# Patient Record
Sex: Male | Born: 1945 | Race: Black or African American | Hispanic: No | Marital: Married | Smoking: Former smoker
Health system: Southern US, Community
[De-identification: ages and names within clinical notes are randomized; demographics above are authoritative.]

## PROBLEM LIST (undated history)

## (undated) DIAGNOSIS — J189 Pneumonia, unspecified organism: Secondary | ICD-10-CM

## (undated) DIAGNOSIS — F1010 Alcohol abuse, uncomplicated: Secondary | ICD-10-CM

## (undated) DIAGNOSIS — M1990 Unspecified osteoarthritis, unspecified site: Secondary | ICD-10-CM

## (undated) DIAGNOSIS — D72819 Decreased white blood cell count, unspecified: Secondary | ICD-10-CM

## (undated) DIAGNOSIS — I10 Essential (primary) hypertension: Secondary | ICD-10-CM

## (undated) HISTORY — DX: Unspecified osteoarthritis, unspecified site: M19.90

## (undated) HISTORY — PX: APPENDECTOMY: SHX54

---

## 2006-11-11 ENCOUNTER — Emergency Department: Payer: Self-pay | Admitting: Emergency Medicine

## 2011-07-12 ENCOUNTER — Emergency Department: Payer: Self-pay | Admitting: Emergency Medicine

## 2011-07-13 ENCOUNTER — Observation Stay: Payer: Self-pay | Admitting: Internal Medicine

## 2011-07-14 DIAGNOSIS — R079 Chest pain, unspecified: Secondary | ICD-10-CM

## 2011-07-17 ENCOUNTER — Ambulatory Visit: Payer: Self-pay | Admitting: Internal Medicine

## 2014-04-20 ENCOUNTER — Ambulatory Visit: Payer: Self-pay | Admitting: Family Medicine

## 2015-11-08 ENCOUNTER — Encounter: Payer: Self-pay | Admitting: *Deleted

## 2015-11-09 ENCOUNTER — Encounter
Admission: RE | Disposition: A | Discharge status: Home / Self Care | Payer: Self-pay | Source: Ambulatory Visit | Attending: Gastroenterology

## 2015-11-09 ENCOUNTER — Ambulatory Visit
Admission: RE | Admit: 2015-11-09 | Discharge: 2015-11-09 | Disposition: A | Discharge status: Home / Self Care | Payer: Medicare Other | Source: Ambulatory Visit | Attending: Gastroenterology | Admitting: Gastroenterology

## 2015-11-09 ENCOUNTER — Encounter: Payer: Self-pay | Admitting: *Deleted

## 2015-11-09 ENCOUNTER — Ambulatory Visit: Payer: Medicare Other | Admitting: Anesthesiology

## 2015-11-09 DIAGNOSIS — K5730 Diverticulosis of large intestine without perforation or abscess without bleeding: Secondary | ICD-10-CM | POA: Insufficient documentation

## 2015-11-09 DIAGNOSIS — Z79899 Other long term (current) drug therapy: Secondary | ICD-10-CM | POA: Insufficient documentation

## 2015-11-09 DIAGNOSIS — D123 Benign neoplasm of transverse colon: Secondary | ICD-10-CM | POA: Diagnosis not present

## 2015-11-09 DIAGNOSIS — J449 Chronic obstructive pulmonary disease, unspecified: Secondary | ICD-10-CM | POA: Insufficient documentation

## 2015-11-09 DIAGNOSIS — Z87898 Personal history of other specified conditions: Secondary | ICD-10-CM | POA: Insufficient documentation

## 2015-11-09 DIAGNOSIS — I10 Essential (primary) hypertension: Secondary | ICD-10-CM | POA: Insufficient documentation

## 2015-11-09 DIAGNOSIS — K635 Polyp of colon: Secondary | ICD-10-CM | POA: Insufficient documentation

## 2015-11-09 DIAGNOSIS — F17200 Nicotine dependence, unspecified, uncomplicated: Secondary | ICD-10-CM | POA: Diagnosis not present

## 2015-11-09 DIAGNOSIS — Z1211 Encounter for screening for malignant neoplasm of colon: Secondary | ICD-10-CM | POA: Insufficient documentation

## 2015-11-09 HISTORY — DX: Decreased white blood cell count, unspecified: D72.819

## 2015-11-09 HISTORY — DX: Alcohol abuse, uncomplicated: F10.10

## 2015-11-09 HISTORY — PX: COLONOSCOPY: SHX5424

## 2015-11-09 HISTORY — DX: Essential (primary) hypertension: I10

## 2015-11-09 SURGERY — COLONOSCOPY
Anesthesia: General

## 2015-11-09 MED ORDER — PROPOFOL 500 MG/50ML IV EMUL
INTRAVENOUS | Status: DC | PRN
  Administered 2015-11-09: 130 ug/kg/min via INTRAVENOUS

## 2015-11-09 MED ORDER — SODIUM CHLORIDE 0.9 % IV SOLN
INTRAVENOUS | Status: DC
  Administered 2015-11-09: 11:00:00 via INTRAVENOUS

## 2015-11-09 MED ORDER — PROPOFOL 10 MG/ML IV BOLUS
INTRAVENOUS | Status: DC | PRN
  Administered 2015-11-09: 10 mg via INTRAVENOUS
  Administered 2015-11-09: 30 mg via INTRAVENOUS
  Administered 2015-11-09: 20 mg via INTRAVENOUS

## 2015-11-09 MED ORDER — PHENYLEPHRINE HCL 10 MG/ML IJ SOLN
INTRAMUSCULAR | Status: DC | PRN
  Administered 2015-11-09 (×2): 100 ug via INTRAVENOUS

## 2015-11-09 MED ORDER — LIDOCAINE HCL (CARDIAC) 20 MG/ML IV SOLN
INTRAVENOUS | Status: DC | PRN
  Administered 2015-11-09: 80 mg via INTRAVENOUS

## 2015-11-09 MED ORDER — MIDAZOLAM HCL 2 MG/2ML IJ SOLN
INTRAMUSCULAR | Status: DC | PRN
  Administered 2015-11-09: 1 mg via INTRAVENOUS

## 2015-11-09 NOTE — H&P (Signed)
  Primary Care Physician:  No primary care provider on file.  Pre-Procedure History & Physical: HPI:  Troy Shepherd is a 69 y.o. male is here for an colonoscopy.   Past Medical History  Diagnosis Date  . Alcohol abuse     quit 09/2014  . Hypertension   . Leukopenia     resolved when stopped drinking 09/2014    Past Surgical History  Procedure Laterality Date  . Appendectomy      Prior to Admission medications   Medication Sig Start Date End Date Taking? Authorizing Provider  amLODipine (NORVASC) 5 MG tablet Take 5 mg by mouth daily.   Yes Historical Provider, MD  lisinopril (PRINIVIL,ZESTRIL) 10 MG tablet Take 10 mg by mouth daily.   Yes Historical Provider, MD    Allergies as of 10/08/2015  . (Not on File)    History reviewed. No pertinent family history.  Social History   Social History  . Marital Status: Married    Spouse Name: N/A  . Number of Children: N/A  . Years of Education: N/A   Occupational History  . Not on file.   Social History Main Topics  . Smoking status: Current Every Day Smoker  . Smokeless tobacco: Never Used  . Alcohol Use: Yes  . Drug Use: No  . Sexual Activity: Not on file   Other Topics Concern  . Not on file   Social History Narrative     Physical Exam: BP 133/80 mmHg  Pulse 86  Temp(Src) 98.8 F (37.1 C) (Tympanic)  Resp 18  Ht '5\' 7"'$  (1.702 m)  Wt 66.679 kg (147 lb)  BMI 23.02 kg/m2  SpO2 100% General:   Alert,  pleasant and cooperative in NAD Head:  Normocephalic and atraumatic. Neck:  Supple; no masses or thyromegaly. Lungs:  Clear throughout to auscultation.    Heart:  Regular rate and rhythm. Abdomen:  Soft, nontender and nondistended. Normal bowel sounds, without guarding, and without rebound.   Neurologic:  Alert and  oriented x4;  grossly normal neurologically.  Impression/Plan: Troy Shepherd is here for an colonoscopy to be performed for screening  Risks, benefits, limitations, and alternatives regarding   colonoscopy have been reviewed with the patient.  Questions have been answered.  All parties agreeable.   Josefine Class, MD  11/09/2015, 10:44 AM

## 2015-11-09 NOTE — Anesthesia Procedure Notes (Signed)
Date/Time: 11/09/2015 10:48 AM Performed by: Johnna Acosta Pre-anesthesia Checklist: Patient identified, Emergency Drugs available, Suction available and Patient being monitored Patient Re-evaluated:Patient Re-evaluated prior to inductionOxygen Delivery Method: Nasal cannula

## 2015-11-09 NOTE — Op Note (Addendum)
[facility] Patient Name: Troy Shepherd Procedure Date: 11/09/2015 10:49 AM MRN: [id number] Account #: [id number] Date of Birth: [birth year] Admit Type: Outpatient Age: 69 Room: [facility] ENDO ROOM 2 Gender: Male Note Status: Finalized Procedure:         Colonoscopy Indications:       Screening for colorectal malignant neoplasm, Last                     colonoscopy: 2005 Patient Profile:   This is a 69 year old male. Providers:         Gerrit Heck. Rayann Heman, MD Referring MD:      Tania Ade (Referring MD) Medicines:         Propofol per Anesthesia Complications:     No immediate complications. Procedure:         Pre-Anesthesia Assessment:                    - Prior to the procedure, a History and Physical was                     performed, and patient medications, allergies and                     sensitivities were reviewed. The patient's tolerance of                     previous anesthesia was reviewed.                    After obtaining informed consent, the colonoscope was                     passed under direct vision. Throughout the procedure, the                     patient's blood pressure, pulse, and oxygen saturations                     were monitored continuously. The Olympus CF-H180AL                     colonoscope ( S#: [id number] ) was introduced through the                     anus and advanced to the the terminal ileum. The                     colonoscopy was performed without difficulty. The patient                     tolerated the procedure well. The quality of the bowel                     preparation was excellent. Findings:      The perianal and digital rectal examinations were normal.      The terminal ileum contained one pedunculated polyp. The polyp was 7 mm       in diameter. The polyp was removed with a cold snare. Resection and       retrieval were complete.      A 8 mm polyp was found in the transverse colon. The polyp  was sessile.       The polyp was removed with a hot snare. Resection and retrieval were  complete.      Many small and large-mouthed diverticula were found in the sigmoid       colon, few pockets of diverticulosis in asc colon Impression:        - One ileal polyp in the terminal ileum. Resected and                     retrieved.                    - One 8 mm polyp in the transverse colon. Resected and                     retrieved.                    - Diverticulosis in the sigmoid colon and asc colon. Recommendation:    - Observe patient in GI recovery unit.                    - High fiber diet.                    - Continue present medications.                    - Await pathology results.                    - Repeat colonoscopy for surveillance based on pathology                     results.                    - Return to referring physician.                    - The findings and recommendations were discussed with the                     patient.                    - The findings and recommendations were discussed with the                     patient's family. Procedure Code(s): --- Professional ---                    [phone], Colonoscopy, flexible; with removal of tumor(s),                     polyp(s), or other lesion(s) by snare technique Diagnosis Code(s): --- Professional ---                    Z12.11, Encounter for screening for malignant neoplasm of                     colon                    D13.39, Benign neoplasm of other parts of small intestine                    D12.3, Benign neoplasm of transverse colon                    K57.30, Diverticulosis of large intestine without  perforation or abscess without bleeding CPT copyright 2014 American Medical Association. All rights reserved. The codes documented in this report are preliminary and upon coder review may  be revised to meet current compliance requirements. Mellody Life, MD 11/09/2015  11:21:02 AM This report has been signed electronically. Number of Addenda: 0 Note Initiated On: 11/09/2015 10:49 AM Scope Withdrawal Time: 0 hours 15 minutes 11 seconds  Total Procedure Duration: 0 hours 21 minutes 18 seconds       [facility]

## 2015-11-09 NOTE — Anesthesia Postprocedure Evaluation (Signed)
  Anesthesia Post-op Note  Patient: Troy Shepherd  Procedure(s) Performed: Procedure(s): COLONOSCOPY (N/A)  Anesthesia type:General  Patient location: PACU  Post pain: Pain level controlled  Post assessment: Post-op Vital signs reviewed, Patient's Cardiovascular Status Stable, Respiratory Function Stable, Patent Airway and No signs of Nausea or vomiting  Post vital signs: Reviewed and stable  Last Vitals:  Filed Vitals:   11/09/15 1123  BP: 85/48  Pulse: 58  Temp: 36.8 C  Resp: 19    Level of consciousness: awake, alert  and patient cooperative  Complications: No apparent anesthesia complications

## 2015-11-09 NOTE — Transfer of Care (Signed)
Immediate Anesthesia Transfer of Care Note  Patient: Troy Shepherd  Procedure(s) Performed: Procedure(s): COLONOSCOPY (N/A)  Patient Location: PACU  Anesthesia Type:General  Level of Consciousness: sedated  Airway & Oxygen Therapy: Patient Spontanous Breathing and Patient connected to nasal cannula oxygen  Post-op Assessment: Report given to RN and Post -op Vital signs reviewed and stable  Post vital signs: Reviewed and stable  Last Vitals:  Filed Vitals:   11/09/15 1024  BP: 133/80  Pulse: 86  Temp: 37.1 C  Resp: 18    Complications: No apparent anesthesia complications

## 2015-11-09 NOTE — Discharge Instructions (Signed)

## 2015-11-09 NOTE — Anesthesia Preprocedure Evaluation (Addendum)
Anesthesia Evaluation  Patient identified by MRN, date of birth, ID band Patient awake    Reviewed: Allergy & Precautions, NPO status , Patient's Chart, lab work & pertinent test results  History of Anesthesia Complications Negative for: history of anesthetic complications  Airway Mallampati: II       Dental  (+) Teeth Intact   Pulmonary COPD, Current Smoker,    + rhonchi  + decreased breath sounds      Cardiovascular hypertension, Pt. on medications  Rhythm:Regular Rate:Normal     Neuro/Psych    GI/Hepatic negative GI ROS, Neg liver ROS,   Endo/Other  negative endocrine ROS  Renal/GU negative Renal ROS     Musculoskeletal   Abdominal Normal abdominal exam  (+)   Peds  Hematology negative hematology ROS (+)   Anesthesia Other Findings   Reproductive/Obstetrics                            Anesthesia Physical Anesthesia Plan  ASA: III  Anesthesia Plan: General   Post-op Pain Management:    Induction: Intravenous  Airway Management Planned: Nasal Cannula  Additional Equipment:   Intra-op Plan:   Post-operative Plan:   Informed Consent: I have reviewed the patients History and Physical, chart, labs and discussed the procedure including the risks, benefits and alternatives for the proposed anesthesia with the patient or authorized representative who has indicated his/her understanding and acceptance.     Plan Discussed with: CRNA  Anesthesia Plan Comments:         Anesthesia Quick Evaluation

## 2015-11-12 LAB — SURGICAL PATHOLOGY

## 2015-11-14 ENCOUNTER — Encounter: Payer: Self-pay | Admitting: Gastroenterology

## 2016-01-06 ENCOUNTER — Encounter: Payer: Self-pay | Admitting: Internal Medicine

## 2016-01-06 ENCOUNTER — Inpatient Hospital Stay
Admission: EM | Admit: 2016-01-06 | Discharge: 2016-01-07 | DRG: 872 | Disposition: A | Discharge status: Home / Self Care | Payer: Medicare Other | Attending: Internal Medicine | Admitting: Internal Medicine

## 2016-01-06 ENCOUNTER — Emergency Department: Payer: Medicare Other

## 2016-01-06 DIAGNOSIS — R6510 Systemic inflammatory response syndrome (SIRS) of non-infectious origin without acute organ dysfunction: Secondary | ICD-10-CM

## 2016-01-06 DIAGNOSIS — D72819 Decreased white blood cell count, unspecified: Secondary | ICD-10-CM | POA: Diagnosis present

## 2016-01-06 DIAGNOSIS — R112 Nausea with vomiting, unspecified: Secondary | ICD-10-CM | POA: Diagnosis present

## 2016-01-06 DIAGNOSIS — A419 Sepsis, unspecified organism: Secondary | ICD-10-CM | POA: Diagnosis not present

## 2016-01-06 DIAGNOSIS — I10 Essential (primary) hypertension: Secondary | ICD-10-CM | POA: Diagnosis present

## 2016-01-06 DIAGNOSIS — J209 Acute bronchitis, unspecified: Secondary | ICD-10-CM | POA: Diagnosis present

## 2016-01-06 DIAGNOSIS — F17200 Nicotine dependence, unspecified, uncomplicated: Secondary | ICD-10-CM | POA: Diagnosis present

## 2016-01-06 DIAGNOSIS — E876 Hypokalemia: Secondary | ICD-10-CM | POA: Diagnosis present

## 2016-01-06 DIAGNOSIS — R05 Cough: Secondary | ICD-10-CM | POA: Diagnosis not present

## 2016-01-06 DIAGNOSIS — Z79899 Other long term (current) drug therapy: Secondary | ICD-10-CM | POA: Diagnosis not present

## 2016-01-06 DIAGNOSIS — R059 Cough, unspecified: Secondary | ICD-10-CM

## 2016-01-06 DIAGNOSIS — E860 Dehydration: Secondary | ICD-10-CM | POA: Diagnosis present

## 2016-01-06 LAB — URINALYSIS COMPLETE WITH MICROSCOPIC (ARMC ONLY)
BILIRUBIN URINE: NEGATIVE
Bacteria, UA: NONE SEEN
Glucose, UA: NEGATIVE mg/dL
HGB URINE DIPSTICK: NEGATIVE
KETONES UR: NEGATIVE mg/dL
LEUKOCYTES UA: NEGATIVE
NITRITE: NEGATIVE
PH: 6 (ref 5.0–8.0)
PROTEIN: NEGATIVE mg/dL
SPECIFIC GRAVITY, URINE: 1.001 — AB (ref 1.005–1.030)
WBC UA: NONE SEEN WBC/hpf (ref 0–5)

## 2016-01-06 LAB — CBC WITH DIFFERENTIAL/PLATELET
Basophils Absolute: 0 10*3/uL (ref 0–0.1)
Basophils Relative: 1 %
EOS ABS: 0 10*3/uL (ref 0–0.7)
EOS PCT: 1 %
HCT: 34.3 % — ABNORMAL LOW (ref 40.0–52.0)
Hemoglobin: 11.9 g/dL — ABNORMAL LOW (ref 13.0–18.0)
LYMPHS ABS: 1 10*3/uL (ref 1.0–3.6)
Lymphocytes Relative: 33 %
MCH: 39.2 pg — AB (ref 26.0–34.0)
MCHC: 34.6 g/dL (ref 32.0–36.0)
MCV: 113.2 fL — AB (ref 80.0–100.0)
Monocytes Absolute: 0.4 10*3/uL (ref 0.2–1.0)
Monocytes Relative: 14 %
Neutro Abs: 1.5 10*3/uL (ref 1.4–6.5)
Neutrophils Relative %: 51 %
PLATELETS: 246 10*3/uL (ref 150–440)
RBC: 3.03 MIL/uL — AB (ref 4.40–5.90)
RDW: 14.6 % — AB (ref 11.5–14.5)
WBC: 3 10*3/uL — AB (ref 3.8–10.6)

## 2016-01-06 LAB — COMPREHENSIVE METABOLIC PANEL
ALT: 19 U/L (ref 17–63)
ANION GAP: 9 (ref 5–15)
AST: 74 U/L — ABNORMAL HIGH (ref 15–41)
Albumin: 3.6 g/dL (ref 3.5–5.0)
Alkaline Phosphatase: 52 U/L (ref 38–126)
BUN: 11 mg/dL (ref 6–20)
CHLORIDE: 101 mmol/L (ref 101–111)
CO2: 28 mmol/L (ref 22–32)
CREATININE: 1.11 mg/dL (ref 0.61–1.24)
Calcium: 8.8 mg/dL — ABNORMAL LOW (ref 8.9–10.3)
Glucose, Bld: 104 mg/dL — ABNORMAL HIGH (ref 65–99)
POTASSIUM: 2.9 mmol/L — AB (ref 3.5–5.1)
SODIUM: 138 mmol/L (ref 135–145)
Total Bilirubin: 0.5 mg/dL (ref 0.3–1.2)
Total Protein: 6.8 g/dL (ref 6.5–8.1)

## 2016-01-06 LAB — TROPONIN I

## 2016-01-06 LAB — LIPASE, BLOOD: LIPASE: 28 U/L (ref 11–51)

## 2016-01-06 LAB — LACTIC ACID, PLASMA: Lactic Acid, Venous: 2.9 mmol/L (ref 0.5–2.0)

## 2016-01-06 LAB — GLUCOSE, CAPILLARY: Glucose-Capillary: 90 mg/dL (ref 65–99)

## 2016-01-06 MED ORDER — SODIUM CHLORIDE 0.9 % IV BOLUS (SEPSIS)
821.0000 mL | Freq: Once | INTRAVENOUS | Status: AC
  Administered 2016-01-06: 821 mL via INTRAVENOUS

## 2016-01-06 MED ORDER — SODIUM CHLORIDE 0.9 % IV BOLUS (SEPSIS)
1000.0000 mL | Freq: Once | INTRAVENOUS | Status: AC
  Administered 2016-01-06: 1000 mL via INTRAVENOUS

## 2016-01-06 MED ORDER — ONDANSETRON HCL 4 MG PO TABS
4.0000 mg | ORAL_TABLET | Freq: Four times a day (QID) | ORAL | Status: DC | PRN
Start: 2016-01-06 — End: 2016-01-07

## 2016-01-06 MED ORDER — VANCOMYCIN HCL IN DEXTROSE 1-5 GM/200ML-% IV SOLN
1000.0000 mg | Freq: Once | INTRAVENOUS | Status: AC
Start: 2016-01-06 — End: 2016-01-06
  Administered 2016-01-06: 1000 mg via INTRAVENOUS
  Filled 2016-01-06: qty 200

## 2016-01-06 MED ORDER — ACETAMINOPHEN 325 MG PO TABS
650.0000 mg | ORAL_TABLET | Freq: Four times a day (QID) | ORAL | Status: DC | PRN
Start: 2016-01-06 — End: 2016-01-07

## 2016-01-06 MED ORDER — ENOXAPARIN SODIUM 40 MG/0.4ML ~~LOC~~ SOLN
40.0000 mg | Freq: Every day | SUBCUTANEOUS | Status: DC
  Administered 2016-01-06: 40 mg via SUBCUTANEOUS
  Filled 2016-01-06: qty 0.4

## 2016-01-06 MED ORDER — ACETAMINOPHEN 650 MG RE SUPP: 650.0000 mg | Freq: Four times a day (QID) | RECTAL | Status: DC | PRN

## 2016-01-06 MED ORDER — LISINOPRIL 10 MG PO TABS
10.0000 mg | ORAL_TABLET | Freq: Every day | ORAL | Status: DC
  Administered 2016-01-07: 08:00:00 10 mg via ORAL
  Filled 2016-01-06: qty 1

## 2016-01-06 MED ORDER — GUAIFENESIN-DM 100-10 MG/5ML PO SYRP
5.0000 mL | ORAL_SOLUTION | ORAL | Status: DC | PRN
  Administered 2016-01-06: 5 mL via ORAL
  Filled 2016-01-06: qty 5

## 2016-01-06 MED ORDER — SODIUM CHLORIDE 0.9 % IV SOLN
INTRAVENOUS | Status: AC
  Administered 2016-01-06 – 2016-01-07 (×2): via INTRAVENOUS

## 2016-01-06 MED ORDER — POTASSIUM CHLORIDE CRYS ER 20 MEQ PO TBCR
40.0000 meq | EXTENDED_RELEASE_TABLET | Freq: Once | ORAL | Status: AC
  Administered 2016-01-06: 40 meq via ORAL
  Filled 2016-01-06: qty 2

## 2016-01-06 MED ORDER — ONDANSETRON HCL 4 MG/2ML IJ SOLN
4.0000 mg | Freq: Once | INTRAMUSCULAR | Status: AC
  Administered 2016-01-06: 4 mg via INTRAVENOUS
  Filled 2016-01-06: qty 2

## 2016-01-06 MED ORDER — ONDANSETRON HCL 4 MG/2ML IJ SOLN: 4.0000 mg | Freq: Four times a day (QID) | INTRAMUSCULAR | Status: DC | PRN

## 2016-01-06 MED ORDER — AMLODIPINE BESYLATE 5 MG PO TABS
5.0000 mg | ORAL_TABLET | Freq: Every day | ORAL | Status: DC
  Administered 2016-01-07: 08:00:00 5 mg via ORAL
  Filled 2016-01-06: qty 1

## 2016-01-06 MED ORDER — PIPERACILLIN-TAZOBACTAM 3.375 G IVPB
3.3750 g | Freq: Once | INTRAVENOUS | Status: AC
  Administered 2016-01-06: 3.375 g via INTRAVENOUS
  Filled 2016-01-06: qty 50

## 2016-01-06 MED ORDER — SODIUM CHLORIDE 0.9 % IJ SOLN
3.0000 mL | Freq: Two times a day (BID) | INTRAMUSCULAR | Status: DC
  Administered 2016-01-06 – 2016-01-07 (×2): 3 mL via INTRAVENOUS

## 2016-01-06 MED ORDER — PIPERACILLIN-TAZOBACTAM 3.375 G IVPB
3.3750 g | Freq: Three times a day (TID) | INTRAVENOUS | Status: DC
  Administered 2016-01-07 (×2): 3.375 g via INTRAVENOUS
  Filled 2016-01-06 (×3): qty 50

## 2016-01-06 MED ORDER — VANCOMYCIN HCL IN DEXTROSE 1-5 GM/200ML-% IV SOLN
1000.0000 mg | INTRAVENOUS | Status: DC
  Administered 2016-01-07: 08:00:00 1000 mg via INTRAVENOUS
  Filled 2016-01-06 (×2): qty 200

## 2016-01-06 NOTE — ED Provider Notes (Signed)
[facility]  ____________________________________________  Time seen: Approximately 17:17 PM  I have reviewed the triage vital signs and the nursing notes.   HISTORY  Chief Complaint Cough    HPI Troy Shepherd is a 70 y.o. male with history of hypertension who presents for evaluation of 3 days productive cough and fatigue, gradual onset, constant since onset, no modifying factors, currently moderate. Patient has had subjective fevers and chills. He had 2 episodes of nonbloody nonbilious emesis today. He denies any chest pain or difficulty breathing. No diarrhea and he denies abdominal pain. No known sick contacts.   Past Medical History  Diagnosis Date  . Alcohol abuse     quit 09/2014  . Hypertension   . Leukopenia     resolved when stopped drinking 09/2014    There are no active problems to display for this patient.   Past Surgical History  Procedure Laterality Date  . Appendectomy    . Colonoscopy N/A 11/09/2015    Procedure: COLONOSCOPY;  Surgeon: Josefine Class, MD;  Location: [facility] ENDOSCOPY;  Service: Endoscopy;  Laterality: N/A;    Current Outpatient Rx  Name  Route  Sig  Dispense  Refill  . amLODipine (NORVASC) 5 MG tablet   Oral   Take 5 mg by mouth daily.         Marland Kitchen lisinopril (PRINIVIL,ZESTRIL) 10 MG tablet   Oral   Take 10 mg by mouth daily.           Allergies Review of patient's allergies indicates no known allergies.  No family history on file.  Social History Social History  Substance Use Topics  . Smoking status: Current Every Day Smoker  . Smokeless tobacco: Never Used  . Alcohol Use: Yes    Review of Systems Constitutional: +subjective fever/chills Eyes: No visual changes. ENT: No sore throat. Cardiovascular: Denies chest pain. Respiratory: Denies shortness of breath. Gastrointestinal: No abdominal pain.  + nausea, + vomiting.  No diarrhea.  No constipation. Genitourinary: Negative for  dysuria. Musculoskeletal: Negative for back pain. Skin: Negative for rash. Neurological: Negative for headaches, focal weakness or numbness.  10-point ROS otherwise negative.  ____________________________________________   PHYSICAL EXAM:  VITAL SIGNS: ED Triage Vitals  Enc Vitals Group     BP 01/06/16 1905 131/71 mmHg     Pulse Rate 01/06/16 1945 62     Resp 01/06/16 1945 18     Temp 01/06/16 2007 96.8 F (36 C)     Temp Source 01/06/16 2007 Oral     SpO2 01/06/16 1945 93 %     Weight 01/06/16 1901 134 lb (60.782 kg)     Height 01/06/16 1901 '5\' 7"'$  (1.702 m)     Head Cir --      Peak Flow --      Pain Score --      Pain Loc --      Pain Edu? --      Excl. in [place]? --     Constitutional: Appears fatigued but answers questions appropriately and follows all commands. Eyes: Conjunctivae are normal. PERRL. EOMI. Head: Atraumatic. Nose: No congestion/rhinnorhea. Mouth/Throat: Mucous membranes are moist.  Oropharynx non-erythematous. Neck: No stridor. Appears supple without meningismus. Cardiovascular: Normal rate, regular rhythm. Grossly normal heart sounds.  Good peripheral circulation. Respiratory: Normal respiratory effort.  No retractions. Lungs CTAB. Gastrointestinal: Soft and nontender. No distention. No CVA tenderness. Genitourinary: deferred Musculoskeletal: No lower extremity tenderness nor edema.  No joint effusions. Neurologic:  Normal speech and  language. No gross focal neurologic deficits are appreciated. 5 out of 5 strength bilateral upper and lower extremity, sensation intact to light touch throughout. Skin:  Skin is warm, dry and intact. No rash noted. Psychiatric: Mood and affect are normal. Speech and behavior are normal.  ____________________________________________   LABS (all labs ordered are listed, but only abnormal results are displayed)  Labs Reviewed  CBC WITH DIFFERENTIAL/PLATELET - Abnormal; Notable for the following:    WBC 3.0 (*)    RBC  3.03 (*)    Hemoglobin 11.9 (*)    HCT 34.3 (*)    MCV 113.2 (*)    MCH 39.2 (*)    RDW 14.6 (*)    All other components within normal limits  COMPREHENSIVE METABOLIC PANEL - Abnormal; Notable for the following:    Potassium 2.9 (*)    Glucose, Bld 104 (*)    Calcium 8.8 (*)    AST 74 (*)    All other components within normal limits  URINALYSIS COMPLETEWITH MICROSCOPIC ([facility] ONLY) - Abnormal; Notable for the following:    Color, Urine COLORLESS (*)    APPearance CLEAR (*)    Specific Gravity, Urine 1.001 (*)    Squamous Epithelial / LPF 0-5 (*)    All other components within normal limits  LACTIC ACID, PLASMA - Abnormal; Notable for the following:    Lactic Acid, Venous 2.9 (*)    All other components within normal limits  CULTURE, BLOOD (ROUTINE X 2)  CULTURE, BLOOD (ROUTINE X 2)  TROPONIN I  GLUCOSE, CAPILLARY  LIPASE, BLOOD  LACTIC ACID, PLASMA  TROPONIN I   ____________________________________________  EKG  ED ECG REPORT I, Joanne Gavel, the attending physician, personally viewed and interpreted this ECG.   Date: 01/06/2016  EKG Time: 19:08  Rate: 67  Rhythm: normal sinus rhythm  Axis: noraml  Intervals:none  ST&T Change:  Q-wave in V1, V2. LVH. No acute ST elevation.   ____________________________________________  RADIOLOGY  CXR IMPRESSION: No acute disease.  Pulmonary hyperexpansion compatible with emphysema. ____________________________________________   PROCEDURES  Procedure(s) performed: None  Critical Care performed: Yes, see critical care note(s). Total critical care time spent 30 minutes.  ____________________________________________   INITIAL IMPRESSION / ASSESSMENT AND PLAN / ED COURSE  Pertinent labs & imaging results that were available during my care of the patient were reviewed by me and considered in my medical decision making (see chart for details).  Troy Shepherd is a 70 y.o. male with history of hypertension who presents  for evaluation of 3 days productive cough and fatigue as well as 2 episodes of nonbloody nonbilious emesis today. On exam, he appears fatigued but nontoxic. Mildly hypothermic with temperature 96.8. Additionally he is leukopenic and intermittently tachypnea meeting several Sirs criteria. He is maintaining adequate blood pressure. Labs reviewed and are otherwise notable for mild anemia with hemoglobin 11.9. Potassium is low at 2.9, room replete. CMP notable for mild AST elevation at 74. Urinalysis is not consistent with infection. Chest x-ray shows no acute disease. Troponin negative. His lactic acid is elevated at 2.9. We'll continue with aggressive IV fluid resuscitation, IV vancomycin and Zosyn were given empirically. source of infection at this time. Neck is supple without meningismus and I doubt meningitis in this patient. He appears to feel much better; however, given that he is meeting 3 out of 4 Sirs criteria, will discuss with hospitalist for admission. ____________________________________________   FINAL CLINICAL IMPRESSION(S) / ED DIAGNOSES  Final diagnoses:  SIRS (systemic inflammatory  response syndrome) (HCC)  Cough  Non-intractable vomiting with nausea, vomiting of unspecified type      Joanne Gavel, MD 01/06/16 2220

## 2016-01-06 NOTE — ED Notes (Signed)
Critical potassium received from lab: 2.9

## 2016-01-06 NOTE — ED Notes (Signed)
Pt lethargic, sweating profusely, per family cold symptoms x 3 days

## 2016-01-06 NOTE — H&P (Signed)
[facility] [place] NAME: Troy Shepherd    MR#:  [id number]  DATE OF BIRTH:  Nov 27, 1946  DATE OF ADMISSION:  01/06/2016  PRIMARY CARE PHYSICIAN: Sabino Snipes KEY, MD   REQUESTING/REFERRING PHYSICIAN: Edd Fabian, MD  CHIEF COMPLAINT:   Chief Complaint  Patient presents with  . Cough    cough x 3 days, lethargic, sweating,    HISTORY OF PRESENT ILLNESS:  Troy Shepherd  is a 70 y.o. male who presents with malaise, productive cough. Patient states that for the last 3 days he's been feeling "bad". He states that he has had a "cold" like this before, but that this year he just started feeling so lethargic and fatigued that he decided to come and be evaluated. He also states that his cough has been very persistent. He denies any overt fevers or chills at home, though he does state that he has been "cold a lot". Here he was found to have a leukopenia as well as a lactic acid elevated to 2.9. He has had some persistent nausea and vomiting at home with decreased by mouth intake. Hospitalists were called for admission for sepsis.  PAST MEDICAL HISTORY:   Past Medical History  Diagnosis Date  . Alcohol abuse     quit 09/2014  . Hypertension   . Leukopenia     resolved when stopped drinking 09/2014    PAST SURGICAL HISTORY:   Past Surgical History  Procedure Laterality Date  . Appendectomy    . Colonoscopy N/A 11/09/2015    Procedure: COLONOSCOPY;  Surgeon: Josefine Class, MD;  Location: [facility] ENDOSCOPY;  Service: Endoscopy;  Laterality: N/A;    SOCIAL HISTORY:   Social History  Substance Use Topics  . Smoking status: Current Every Day Smoker  . Smokeless tobacco: Never Used  . Alcohol Use: No     Comment: quit 2015    FAMILY HISTORY:  No family history on file.  DRUG ALLERGIES:  No Known Allergies  MEDICATIONS AT HOME:   Prior to Admission medications   Medication Sig Start Date End Date Taking? Authorizing Provider   amLODipine (NORVASC) 5 MG tablet Take 5 mg by mouth daily.    Historical Provider, MD  lisinopril (PRINIVIL,ZESTRIL) 10 MG tablet Take 10 mg by mouth daily.    Historical Provider, MD    REVIEW OF SYSTEMS:  Review of Systems  Constitutional: Positive for malaise/fatigue. Negative for fever, chills and weight loss.  HENT: Negative for ear pain, hearing loss and tinnitus.   Eyes: Negative for blurred vision, double vision, pain and redness.  Respiratory: Positive for cough and sputum production. Negative for hemoptysis and shortness of breath.   Cardiovascular: Negative for chest pain, palpitations, orthopnea and leg swelling.  Gastrointestinal: Positive for nausea and vomiting. Negative for abdominal pain, diarrhea and constipation.  Genitourinary: Negative for dysuria, frequency and hematuria.  Musculoskeletal: Negative for back pain, joint pain and neck pain.  Skin:       No acne, rash, or lesions  Neurological: Positive for weakness. Negative for dizziness, tremors and focal weakness.  Endo/Heme/Allergies: Negative for polydipsia. Does not bruise/bleed easily.  Psychiatric/Behavioral: Negative for depression. The patient is not nervous/anxious and does not have insomnia.      VITAL SIGNS:   Filed Vitals:   01/06/16 1901 01/06/16 1905 01/06/16 1945 01/06/16 2007  BP:  131/71  120/68  Pulse:   62 65  Temp:    96.8 F (36 C)  TempSrc:  Oral  Resp:   18 18  Height: '5\' 7"'$  (1.702 m)     Weight: 60.782 kg (134 lb)     SpO2:   93% 97%   Wt Readings from Last 3 Encounters:  01/06/16 60.782 kg (134 lb)  11/09/15 66.679 kg (147 lb)    PHYSICAL EXAMINATION:  Physical Exam  Vitals reviewed. Constitutional: He is oriented to person, place, and time. He appears well-developed and well-nourished. No distress.  HENT:  Head: Normocephalic and atraumatic.  Dry mucous membranes  Eyes: Conjunctivae and EOM are normal. Pupils are equal, round, and reactive to light. No scleral icterus.   Neck: Normal range of motion. Neck supple. No JVD present. No thyromegaly present.  Cardiovascular: Normal rate, regular rhythm and intact distal pulses.  Exam reveals no gallop and no friction rub.   No murmur heard. Respiratory: Effort normal and breath sounds normal. No respiratory distress. He has no wheezes. He has no rales.  GI: Soft. Bowel sounds are normal. He exhibits no distension. There is no tenderness.  Musculoskeletal: Normal range of motion. He exhibits no edema.  No arthritis, no gout  Lymphadenopathy:    He has no cervical adenopathy.  Neurological: He is alert and oriented to person, place, and time. No cranial nerve deficit.  No dysarthria, no aphasia  Skin: Skin is warm and dry. No rash noted. No erythema.  Psychiatric: He has a normal mood and affect. His behavior is normal. Judgment and thought content normal.    LABORATORY PANEL:   CBC  Recent Labs Lab 01/06/16 1915  WBC 3.0*  HGB 11.9*  HCT 34.3*  PLT 246   ------------------------------------------------------------------------------------------------------------------  Chemistries   Recent Labs Lab 01/06/16 1915  NA 138  K 2.9*  CL 101  CO2 28  GLUCOSE 104*  BUN 11  CREATININE 1.11  CALCIUM 8.8*  AST 74*  ALT 19  ALKPHOS 52  BILITOT 0.5   ------------------------------------------------------------------------------------------------------------------  Cardiac Enzymes  Recent Labs Lab 01/06/16 1915  TROPONINI <0.03   ------------------------------------------------------------------------------------------------------------------  RADIOLOGY:  Dg Chest 2 View  01/06/2016  CLINICAL DATA:  Cough for 1 week. EXAM: CHEST  2 VIEW COMPARISON:  Single view of the chest 07/12/2011. PET CT scan 07/07/2011. FINDINGS: The lungs are clear the chest is hyperexpanded. Heart size is normal. No pneumothorax or pleural effusion. No focal bony abnormality. IMPRESSION: No acute disease. Pulmonary  hyperexpansion compatible with emphysema. Electronically Signed   By: Inge Rise M.D.   On: 01/06/2016 20:35    EKG:   Orders placed or performed during the hospital encounter of 01/06/16  . EKG 12-Lead  . EKG 12-Lead    IMPRESSION AND PLAN:  Principal Problem:   Sepsis ([place]) - broad antibiotics given in the ED, continue on admission. Blood culture sent from the ED. We'll also get a sputum culture, this chest x-ray did not show any overt pneumonia. We will trend his lactic acid serially until normal, and give aggressive fluid hydration to help boost his blood pressure and treat his dehydration, see below. Active Problems:   Nausea and vomiting - when necessary antiemetics, fluid hydration.   Dehydration - aggressive fluid hydration, no significant impact on his renal function at this point.   Hypokalemia - replete potassium and monitor until normal.   HTN (hypertension) - patient is not currently hypertensive this time, we'll continue his home medications to treat this  All the records are reviewed and case discussed with ED provider. Management plans discussed with the patient and/or  family.  DVT PROPHYLAXIS: SubQ lovenox  GI PROPHYLAXIS: None  ADMISSION STATUS: Inpatient  CODE STATUS: Full  TOTAL TIME TAKING CARE OF THIS PATIENT: 40 minutes.    Troy Shepherd, [place] 01/06/2016, 10:25 PM  [facility]  Office  [phone]  CC: Primary care physician; Sabino Snipes KEY, MD

## 2016-01-06 NOTE — ED Notes (Signed)
Critical lactic acid received from lab: 2.9 Dr. Edd Fabian notified of critical lactic and potassium

## 2016-01-06 NOTE — Progress Notes (Signed)
ANTIBIOTIC CONSULT NOTE - INITIAL  Pharmacy Consult for vancomycin/zosyn Indication: rule out sepsis  No Known Allergies  Patient Measurements: Height: '5\' 7"'$  (170.2 cm) Weight: 139 lb 11.2 oz (63.368 kg) IBW/kg (Calculated) : 66.1 Adjusted Body Weight: 60.8 kg  Vital Signs: Temp: 98 F (36.7 C) (01/08 2335) Temp Source: Oral (01/08 2335) BP: 116/61 mmHg (01/08 2335) Pulse Rate: 64 (01/08 2335) Intake/Output from previous day:   Intake/Output from this shift: Total I/O In: 1000 [I.V.:1000] Out: -   Labs:  Recent Labs  01/06/16 1915  WBC 3.0*  HGB 11.9*  PLT 246  CREATININE 1.11   Estimated Creatinine Clearance: 55.5 mL/min (by C-G formula based on Cr of 1.11). No results for input(s): VANCOTROUGH, VANCOPEAK, VANCORANDOM, GENTTROUGH, GENTPEAK, GENTRANDOM, TOBRATROUGH, TOBRAPEAK, TOBRARND, AMIKACINPEAK, AMIKACINTROU, AMIKACIN in the last 72 hours.   Microbiology: No results found for this or any previous visit (from the past 720 hour(s)).  Medical History: Past Medical History  Diagnosis Date  . Alcohol abuse     quit 09/2014  . Hypertension   . Leukopenia     resolved when stopped drinking 09/2014    Medications:  Infusions:  . sodium chloride     Assessment: 69 yom cc cough x 3 days, lethargy, sweating. ED notes leukopenia and LA 2.9. Pharmacy consulted to dose vancomycin and Zosyn for sepsis.  Vd 42.6 L, Ke 0.049 hr-1, T1/2 14.3 hr  Goal of Therapy:  Vancomycin trough level 15-20 mcg/ml  Plan:  Expected duration 7 days with resolution of temperature and/or normalization of WBC. Zosyn 3.375 gm IV Q8H EI and vancomycin 1 gm IV Q18H with stacked dosing, second dose approximately 10 hours after first, predicted trough 17 mcg/mL. Pharmacy will continue to follow and adjust as needed to maintain trough 15 to 20 mcg/mL.  Laural Benes, Pharm.D., BCPS Clinical Pharmacist 01/06/2016,11:37 PM

## 2016-01-07 LAB — CBC
HEMATOCRIT: 30.9 % — AB (ref 40.0–52.0)
Hemoglobin: 10.6 g/dL — ABNORMAL LOW (ref 13.0–18.0)
MCH: 38.6 pg — ABNORMAL HIGH (ref 26.0–34.0)
MCHC: 34.3 g/dL (ref 32.0–36.0)
MCV: 112.6 fL — ABNORMAL HIGH (ref 80.0–100.0)
PLATELETS: 224 10*3/uL (ref 150–440)
RBC: 2.75 MIL/uL — ABNORMAL LOW (ref 4.40–5.90)
RDW: 14.3 % (ref 11.5–14.5)
WBC: 2.3 10*3/uL — AB (ref 3.8–10.6)

## 2016-01-07 LAB — BASIC METABOLIC PANEL
Anion gap: 5 (ref 5–15)
BUN: 9 mg/dL (ref 6–20)
CALCIUM: 8 mg/dL — AB (ref 8.9–10.3)
CHLORIDE: 108 mmol/L (ref 101–111)
CO2: 29 mmol/L (ref 22–32)
CREATININE: 1.02 mg/dL (ref 0.61–1.24)
GFR calc Af Amer: >60 mL/min (ref >60)
GFR calc non Af Amer: >60 mL/min (ref >60)
Glucose, Bld: 104 mg/dL — ABNORMAL HIGH (ref 65–99)
Potassium: 3.7 mmol/L (ref 3.5–5.1)
SODIUM: 142 mmol/L (ref 135–145)

## 2016-01-07 LAB — LACTIC ACID, PLASMA: LACTIC ACID, VENOUS: 2.4 mmol/L — AB (ref 0.5–2.0)

## 2016-01-07 MED ORDER — LEVOFLOXACIN 500 MG PO TABS: 500.0000 mg | ORAL_TABLET | Freq: Every day | ORAL | Status: DC

## 2016-01-07 MED ORDER — GUAIFENESIN-DM 100-10 MG/5ML PO SYRP: 5.0000 mL | ORAL_SOLUTION | ORAL | Status: DC | PRN

## 2016-01-07 MED ORDER — AMOXICILLIN-POT CLAVULANATE 875-125 MG PO TABS: 1.0000 | ORAL_TABLET | Freq: Two times a day (BID) | ORAL | Status: DC

## 2016-01-07 NOTE — Discharge Summary (Signed)
[facility] NAME: Troy Shepherd    MR#:  [id number]  DATE OF BIRTH:  [birth year]  DATE OF ADMISSION:  01/06/2016 ADMITTING PHYSICIAN: Lance Coon, MD  DATE OF DISCHARGE: 01/07/16  PRIMARY CARE PHYSICIAN: SOLES, MEREDITH KEY, MD    ADMISSION DIAGNOSIS:  Cough [R05] SIRS (systemic inflammatory response syndrome) (HCC) [R65.10] Non-intractable vomiting with nausea, vomiting of unspecified type [R11.2]  DISCHARGE DIAGNOSIS:  Sepsis resolved Acute bronchitis-mild Chronic leucopenia Tobacco abuse SECONDARY DIAGNOSIS:   Past Medical History  Diagnosis Date  . Alcohol abuse     quit 09/2014  . Hypertension   . Leukopenia     resolved when stopped drinking 09/2014    HOSPITAL COURSE:  Troy Shepherd is a 70 y.o. male who presents with malaise, productive cough. Patient states that for the last 3 days he's been feeling "bad". He states that he has had a "cold" like this before, but that this year he just started feeling so lethargic and fatigued that he decided to come and be evaluated  1.Sepsis ([place]) - broad antibiotics given in the ED -change to po augementin -BC NGTD 9spoke with lab tech) Afebrile -pt has chronic leucopenia  2.acute mild bronchitis -change to po augmentin  3 Nausea and vomiting - when necessary antiemetics, fluid hydration. -resolved  4. Dehydration -  no significant impact on his renal function at this point. -appears euvolemic  5. Hypokalemia - replete potassium and monitor until normal.  6. HTN (hypertension) - patient is not currently hypertensive this time, we'll continue his home medications to treat this  Overall stable. Pt ok to go home CONSULTS OBTAINED:     DRUG ALLERGIES:  No Known Allergies  DISCHARGE MEDICATIONS:   Current Discharge Medication List    START taking these medications   Details  amoxicillin-clavulanate (AUGMENTIN) 875-125 MG tablet Take 1 tablet by mouth  every 12 (twelve) hours. Qty: 12 tablet, Refills: 0    guaiFENesin-dextromethorphan (ROBITUSSIN DM) 100-10 MG/5ML syrup Take 5 mLs by mouth every 4 (four) hours as needed for cough. Qty: 118 mL, Refills: 0      CONTINUE these medications which have NOT CHANGED   Details  amLODipine (NORVASC) 5 MG tablet Take 5 mg by mouth daily.    lisinopril (PRINIVIL,ZESTRIL) 10 MG tablet Take 10 mg by mouth daily.        If you experience worsening of your admission symptoms, develop shortness of breath, life threatening emergency, suicidal or homicidal thoughts you must seek medical attention immediately by calling 911 or calling your MD immediately  if symptoms less severe.  You Must read complete instructions/literature along with all the possible adverse reactions/side effects for all the Medicines you take and that have been prescribed to you. Take any new Medicines after you have completely understood and accept all the possible adverse reactions/side effects.   Please note  You were cared for by a hospitalist during your hospital stay. If you have any questions about your discharge medications or the care you received while you were in the hospital after you are discharged, you can call the unit and asked to speak with the hospitalist on call if the hospitalist that took care of you is not available. Once you are discharged, your primary care physician will handle any further medical issues. Please note that NO REFILLS for any discharge medications will be authorized once you are discharged, as it is imperative that you return to your primary care  physician (or establish a relationship with a primary care physician if you do not have one) for your aftercare needs so that they can reassess your need for medications and monitor your lab values. Today   SUBJECTIVE  No complaints. Eating well. No fever. Mild cough  VITAL SIGNS:  Blood pressure 116/50, pulse 80, temperature 98.6 F (37 C),  temperature source Oral, resp. rate 18, height '5\' 7"'$  (1.702 m), weight 63.368 kg (139 lb 11.2 oz), SpO2 96 %.  I/O:   Intake/Output Summary (Last 24 hours) at 01/07/16 1323 Last data filed at 01/07/16 1232  Gross per 24 hour  Intake   1240 ml  Output    775 ml  Net    465 ml    PHYSICAL EXAMINATION:  GENERAL:  70 y.o.-year-old patient lying in the bed with no acute distress.  EYES: Pupils equal, round, reactive to light and accommodation. No scleral icterus. Extraocular muscles intact.  HEENT: Head atraumatic, normocephalic. Oropharynx and nasopharynx clear.  NECK:  Supple, no jugular venous distention. No thyroid enlargement, no tenderness.  LUNGS: Normal breath sounds bilaterally, no wheezing, rales,rhonchi or crepitation. No use of accessory muscles of respiration.  CARDIOVASCULAR: S1, S2 normal. No murmurs, rubs, or gallops.  ABDOMEN: Soft, non-tender, non-distended. Bowel sounds present. No organomegaly or mass.  EXTREMITIES: No pedal edema, cyanosis, or clubbing.  NEUROLOGIC: Cranial nerves II through XII are intact. Muscle strength 5/5 in all extremities. Sensation intact. Gait not checked.  PSYCHIATRIC: The patient is alert and oriented x 3.  SKIN: No obvious rash, lesion, or ulcer.   DATA REVIEW:   CBC   Recent Labs Lab 01/07/16 0412  WBC 2.3*  HGB 10.6*  HCT 30.9*  PLT 224    Chemistries   Recent Labs Lab 01/06/16 1915 01/07/16 0412  NA 138 142  K 2.9* 3.7  CL 101 108  CO2 28 29  GLUCOSE 104* 104*  BUN 11 9  CREATININE 1.11 1.02  CALCIUM 8.8* 8.0*  AST 74*  --   ALT 19  --   ALKPHOS 52  --   BILITOT 0.5  --     Microbiology Results   Recent Results (from the past 240 hour(s))  Blood culture (routine x 2)     Status: None (Preliminary result)   Collection Time: 01/06/16  7:15 PM  Result Value Ref Range Status   Specimen Description BLOOD LEFT ASSIST CONTROL  Final   Special Requests BOTTLES DRAWN AEROBIC AND ANAEROBIC 4CC  Final   Culture NO  GROWTH < 24 HOURS  Final   Report Status PENDING  Incomplete  Blood culture (routine x 2)     Status: None (Preliminary result)   Collection Time: 01/06/16  7:20 PM  Result Value Ref Range Status   Specimen Description BLOOD RIGHT ASSIST CONTROL  Final   Special Requests BOTTLES DRAWN AEROBIC AND ANAEROBIC 4CC  Final   Culture NO GROWTH < 24 HOURS  Final   Report Status PENDING  Incomplete    RADIOLOGY:  Dg Chest 2 View  01/06/2016  CLINICAL DATA:  Cough for 1 week. EXAM: CHEST  2 VIEW COMPARISON:  Single view of the chest 07/12/2011. PET CT scan 07/07/2011. FINDINGS: The lungs are clear the chest is hyperexpanded. Heart size is normal. No pneumothorax or pleural effusion. No focal bony abnormality. IMPRESSION: No acute disease. Pulmonary hyperexpansion compatible with emphysema. Electronically Signed   By: Inge Rise M.D.   On: 01/06/2016 20:35     Management plans  discussed with the patient, family and they are in agreement.  CODE STATUS:     Code Status Orders        Start     Ordered   01/06/16 2330  Full code   Continuous     01/06/16 2329      TOTAL TIME TAKING CARE OF THIS PATIENT:40 minutes.    Ammara Raj M.D on 01/07/2016 at 1:23 PM  Between 7am to 6pm - Pager - [phone] After 6pm go to [email or web address] - password [email or web address] [facility]  Office  [phone]  CC: Primary care physician; Sabino Snipes KEY, MD

## 2016-01-07 NOTE — Discharge Instructions (Signed)

## 2016-01-07 NOTE — Plan of Care (Addendum)
Problem: Fluid Volume: Goal: Hemodynamic stability will improve Outcome: Progressing IVF infusing as ordered, VSS, afebrile   Problem: Physical Regulation: Goal: Diagnostic test results will improve Outcome: Progressing -Lactic Acid trending down: 2.4 -non-productive cough- robitussin given w/ noted relief.   Goal: Signs and symptoms of infection will decrease Outcome: Progressing -IV Abx given as ordered: Zosyn & Vanc -WBC 3.0 -no changes noted in condition   Problem: Respiratory: Goal: Ability to maintain adequate ventilation will improve Outcome: Progressing Oxygen saturations stable on 2L via nasal cannula, which is new   Problem: Safety: Goal: Ability to remain free from injury will improve Outcome: Progressing Moderate fall risk, generalized weakness noted. Bed alarm on. Safe environment provided. Pt understands how to use call system for assistance.   Problem: Activity: Goal: Risk for activity intolerance will decrease Outcome: Progressing +1 standby assist, rest periods provided and promoted.

## 2016-01-07 NOTE — Progress Notes (Signed)
Patient discharged home per MD order. IVs removed.  Discharge instructions given to patient and wife. Activity, follow up care, medicines, prescriptions and diet reviewed with the patient and wife. All questions answered. Patient verbalized understanding. Patient left via wheelchair with volunteers and family.

## 2016-01-11 LAB — CULTURE, BLOOD (ROUTINE X 2)
Culture: NO GROWTH
Culture: NO GROWTH

## 2016-07-07 ENCOUNTER — Other Ambulatory Visit: Payer: Self-pay | Admitting: Family Medicine

## 2016-07-07 DIAGNOSIS — M79605 Pain in left leg: Secondary | ICD-10-CM

## 2016-07-08 ENCOUNTER — Ambulatory Visit
Admission: RE | Admit: 2016-07-08 | Discharge: 2016-07-08 | Disposition: A | Discharge status: Home / Self Care | Payer: Medicare Other | Source: Ambulatory Visit | Attending: Family Medicine | Admitting: Family Medicine

## 2016-07-08 ENCOUNTER — Other Ambulatory Visit: Payer: Self-pay | Admitting: Family Medicine

## 2016-07-08 DIAGNOSIS — M79605 Pain in left leg: Secondary | ICD-10-CM

## 2016-10-25 ENCOUNTER — Encounter: Payer: Self-pay | Admitting: Emergency Medicine

## 2016-10-25 ENCOUNTER — Emergency Department: Payer: Medicare Other

## 2016-10-25 ENCOUNTER — Emergency Department
Admission: EM | Admit: 2016-10-25 | Discharge: 2016-10-25 | Disposition: A | Discharge status: Home / Self Care | Payer: Medicare Other | Attending: Emergency Medicine | Admitting: Emergency Medicine

## 2016-10-25 DIAGNOSIS — F17200 Nicotine dependence, unspecified, uncomplicated: Secondary | ICD-10-CM | POA: Insufficient documentation

## 2016-10-25 DIAGNOSIS — D72819 Decreased white blood cell count, unspecified: Secondary | ICD-10-CM | POA: Diagnosis not present

## 2016-10-25 DIAGNOSIS — I10 Essential (primary) hypertension: Secondary | ICD-10-CM | POA: Diagnosis not present

## 2016-10-25 DIAGNOSIS — E876 Hypokalemia: Secondary | ICD-10-CM | POA: Insufficient documentation

## 2016-10-25 DIAGNOSIS — M150 Primary generalized (osteo)arthritis: Secondary | ICD-10-CM | POA: Diagnosis not present

## 2016-10-25 DIAGNOSIS — Z79899 Other long term (current) drug therapy: Secondary | ICD-10-CM | POA: Diagnosis not present

## 2016-10-25 DIAGNOSIS — M79661 Pain in right lower leg: Secondary | ICD-10-CM | POA: Diagnosis present

## 2016-10-25 DIAGNOSIS — M159 Polyosteoarthritis, unspecified: Secondary | ICD-10-CM

## 2016-10-25 NOTE — ED Provider Notes (Signed)
[facility] Emergency Department Provider Note   ____________________________________________   First MD Initiated Contact with Patient 10/25/16 [phone]     (approximate)  I have reviewed the triage vital signs and the nursing notes.   HISTORY  Chief Complaint Arm Pain and Leg Pain    HPI Troy Shepherd is a 70 y.o. male patient complaining of bilateral upper and lower extremity pain for 1 week. Patient state he was seen by his family doctor and was told he probably is developing arthritis. Patient is concerned because the medicine prescribed for him is not helping with this complaint.Patient also is concerned because the pain is not constant. Patient awakened with pain but admits no pain at this time. She describes pain as "achy".  Past Medical History:  Diagnosis Date  . Alcohol abuse    quit 09/2014  . Hypertension   . Leukopenia    resolved when stopped drinking 09/2014    Patient Active Problem List   Diagnosis Date Noted  . Sepsis ([place]) 01/06/2016  . Hypokalemia 01/06/2016  . HTN (hypertension) 01/06/2016  . Nausea and vomiting 01/06/2016  . Dehydration 01/06/2016    Past Surgical History:  Procedure Laterality Date  . APPENDECTOMY    . COLONOSCOPY N/A 11/09/2015   Procedure: COLONOSCOPY;  Surgeon: Josefine Class, MD;  Location: [facility] ENDOSCOPY;  Service: Endoscopy;  Laterality: N/A;    Prior to Admission medications   Medication Sig Start Date End Date Taking? Authorizing Provider  amLODipine (NORVASC) 5 MG tablet Take 5 mg by mouth daily.    Historical Provider, MD  amoxicillin-clavulanate (AUGMENTIN) 875-125 MG tablet Take 1 tablet by mouth every 12 (twelve) hours. 01/07/16   Fritzi Mandes, MD  guaiFENesin-dextromethorphan (ROBITUSSIN DM) 100-10 MG/5ML syrup Take 5 mLs by mouth every 4 (four) hours as needed for cough. 01/07/16   Fritzi Mandes, MD  lisinopril (PRINIVIL,ZESTRIL) 10 MG tablet Take 10 mg by mouth daily.    Historical Provider, MD     Allergies Review of patient's allergies indicates no known allergies.  History reviewed. No pertinent family history.  Social History Social History  Substance Use Topics  . Smoking status: Current Every Day Smoker  . Smokeless tobacco: Never Used  . Alcohol use No     Comment: quit 2015    Review of Systems Constitutional: No fever/chills Eyes: No visual changes. ENT: No sore throat. Cardiovascular: Denies chest pain. Respiratory: Denies shortness of breath. Gastrointestinal: No abdominal pain.  No nausea, no vomiting.  No diarrhea.  No constipation. Genitourinary: Negative for dysuria. Musculoskeletal: Negative for back pain. Skin: Negative for rash. Neurological: Negative for headaches, focal weakness or numbness. Endocrine:Hypertension and hypokalemia. Hematological/Lymphatic: Leukopenia   ____________________________________________   PHYSICAL EXAM:  VITAL SIGNS: ED Triage Vitals   Enc Vitals Group     BP (!) 174/88     Pulse Rate 73     Resp 18     Temp 98 F (36.7 C)     Temp Source Oral     SpO2 95 %     Weight 139 lb (63 kg)     Height '5\' 6"'$  (1.676 m)     Head Circumference      Peak Flow      Pain Score 0     Pain Loc      Pain Edu?      Excl. in [place]?     Constitutional: Alert and oriented. Well appearing and in no acute distress. Eyes: Conjunctivae are normal.  PERRL. EOMI. Head: Atraumatic. Nose: No congestion/rhinnorhea. Mouth/Throat: Mucous membranes are moist.  Oropharynx non-erythematous. Neck: No stridor.  No cervical spine tenderness to palpation. Hematological/Lymphatic/Immunilogical: No cervical lymphadenopathy. Cardiovascular: Normal rate, regular rhythm. Grossly normal heart sounds.  Good peripheral circulation. Respiratory: Normal respiratory effort.  No retractions. Lungs CTAB. Gastrointestinal: Soft and nontender. No distention. No abdominal bruits. No CVA tenderness. Musculoskeletal: No obvious deformity edema or erythema.  Patient has full nuchal range of motion of the upper and lower extremities.  Neurologic:  Normal speech and language. No gross focal neurologic deficits are appreciated. No gait instability. Skin:  Skin is warm, dry and intact. No rash noted. Psychiatric: Mood and affect are normal. Speech and behavior are normal.  ____________________________________________   LABS (all labs ordered are listed, but only abnormal results are displayed)  Labs Reviewed - No data to display ____________________________________________  EKG   ____________________________________________  RADIOLOGY  X-ray findings consistent with osteoarthritis. ____________________________________________   PROCEDURES  Procedure(s) performed: None  Procedures  Critical Care performed: No  ____________________________________________   INITIAL IMPRESSION / ASSESSMENT AND PLAN / ED COURSE  Pertinent labs & imaging results that were available during my care of the patient were reviewed by me and considered in my medical decision making (see chart for details).  Arthralgia secondary to arthritis. Discussed x-ray finding with patient. Advised patient continue taking meloxicam prescribed by his treated doctor. Patient advised follow up with orthopedics condition worsens.  Clinical Course     ____________________________________________   FINAL CLINICAL IMPRESSION(S) / ED DIAGNOSES  Final diagnoses:  Primary osteoarthritis involving multiple joints      NEW MEDICATIONS STARTED DURING THIS VISIT:  New Prescriptions   No medications on file     Note:  This document was prepared using Dragon voice recognition software and may include unintentional dictation errors.    Sable Feil, PA-C 10/25/16 [place], MD 10/25/16 1149

## 2016-10-25 NOTE — ED Notes (Signed)
See triage note dx'd with arthritis  conts to have intermittent pain

## 2016-10-25 NOTE — ED Triage Notes (Signed)
Pt to ed with c/o bilat arm pain starting at elbows and bilat leg pain.  Pt states he was seen last Saturday for same at [facility].  Dx with arthritis.  States pain is intermittent but "i want to know why it goes and comes and is still hurting"

## 2017-02-14 ENCOUNTER — Emergency Department
Admission: EM | Admit: 2017-02-14 | Discharge: 2017-02-14 | Disposition: A | Discharge status: Home / Self Care | Payer: Medicare PPO | Attending: Emergency Medicine | Admitting: Emergency Medicine

## 2017-02-14 ENCOUNTER — Encounter: Payer: Self-pay | Admitting: Emergency Medicine

## 2017-02-14 DIAGNOSIS — I10 Essential (primary) hypertension: Secondary | ICD-10-CM | POA: Diagnosis not present

## 2017-02-14 DIAGNOSIS — M19021 Primary osteoarthritis, right elbow: Secondary | ICD-10-CM

## 2017-02-14 DIAGNOSIS — M1711 Unilateral primary osteoarthritis, right knee: Secondary | ICD-10-CM | POA: Diagnosis not present

## 2017-02-14 DIAGNOSIS — M25521 Pain in right elbow: Secondary | ICD-10-CM | POA: Diagnosis present

## 2017-02-14 DIAGNOSIS — F17210 Nicotine dependence, cigarettes, uncomplicated: Secondary | ICD-10-CM | POA: Diagnosis not present

## 2017-02-14 DIAGNOSIS — Z79899 Other long term (current) drug therapy: Secondary | ICD-10-CM | POA: Diagnosis not present

## 2017-02-14 MED ORDER — LISINOPRIL 10 MG PO TABS
10.0000 mg | ORAL_TABLET | Freq: Once | ORAL | Status: AC
  Administered 2017-02-14: 10 mg via ORAL
  Filled 2017-02-14: qty 1

## 2017-02-14 MED ORDER — TRAMADOL HCL 50 MG PO TABS
50.0000 mg | ORAL_TABLET | Freq: Once | ORAL | Status: AC
  Administered 2017-02-14: 50 mg via ORAL
  Filled 2017-02-14: qty 1

## 2017-02-14 MED ORDER — LISINOPRIL 10 MG PO TABS: 10.0000 mg | ORAL_TABLET | Freq: Every day | ORAL | 0 refills | Status: DC

## 2017-02-14 MED ORDER — TRAMADOL HCL 50 MG PO TABS: 50.0000 mg | ORAL_TABLET | Freq: Four times a day (QID) | ORAL | 0 refills | Status: DC | PRN

## 2017-02-14 NOTE — ED Triage Notes (Signed)
R elbow and knee pain x 1 month. Denies injury at onset. History of arthritis.

## 2017-02-14 NOTE — ED Provider Notes (Signed)
[facility] Emergency Department Provider Note  ____________________________________________   First MD Initiated Contact with Patient 02/14/17 [phone]     (approximate)  I have reviewed the triage vital signs and the nursing notes.   HISTORY  Chief Complaint Joint Swelling   HPI Troy Shepherd is a 71 y.o. male was brought in today  by family with complaint of right elbow and right knee pain. Patient has a history of arthritis.Family states he was seen in October 2017 and given medication which did not help his pain. Patient and family deny any knowledge of returning or calling his PCP to let them know that indication did not help. Patient and family deny any injury to his right knee or elbow. He denies any fever or chills. There's been no nausea or vomiting. Patient is hypertensive and takes medication. He states that yesterday was the last lisinopril tablet that he had and has not taken that medication today. He denies any shortness of breath, chest pain, diaphoresis, or nausea. Patient is unaware of any history of gout. Patient states pain is increased with movement or walking.   Past Medical History:  Diagnosis Date  . Alcohol abuse    quit 09/2014  . Hypertension   . Leukopenia    resolved when stopped drinking 09/2014    Patient Active Problem List   Diagnosis Date Noted  . Sepsis ([place]) 01/06/2016  . Hypokalemia 01/06/2016  . HTN (hypertension) 01/06/2016  . Nausea and vomiting 01/06/2016  . Dehydration 01/06/2016    Past Surgical History:  Procedure Laterality Date  . APPENDECTOMY    . COLONOSCOPY N/A 11/09/2015   Procedure: COLONOSCOPY;  Surgeon: Josefine Class, MD;  Location: [facility] ENDOSCOPY;  Service: Endoscopy;  Laterality: N/A;    Prior to Admission medications   Medication Sig Start Date End Date Taking? Authorizing Provider  amLODipine (NORVASC) 5 MG tablet Take 5 mg by mouth daily.    Historical Provider, MD  lisinopril  (PRINIVIL,ZESTRIL) 10 MG tablet Take 10 mg by mouth daily.    Historical Provider, MD  lisinopril (PRINIVIL,ZESTRIL) 10 MG tablet Take 1 tablet (10 mg total) by mouth daily. 02/14/17 02/14/18  Johnn Hai, PA-C  traMADol (ULTRAM) 50 MG tablet Take 1 tablet (50 mg total) by mouth every 6 (six) hours as needed for moderate pain. 02/14/17   Johnn Hai, PA-C    Allergies Patient has no known allergies.  No family history on file.  Social History Social History  Substance Use Topics  . Smoking status: Current Every Day Smoker    Packs/day: 0.10    Types: Cigarettes  . Smokeless tobacco: Never Used  . Alcohol use No     Comment: quit 2015    Review of Systems Constitutional: No fever/chills Cardiovascular: Denies chest pain. Respiratory: Denies shortness of breath. Gastrointestinal: No abdominal pain.  No nausea, no vomiting.   Musculoskeletal: Positive right knee pain. Positive right elbow pain. Skin: Negative for rash. Neurological: Negative for headaches, focal weakness or numbness.  10-point ROS otherwise negative.  ____________________________________________   PHYSICAL EXAM:  VITAL SIGNS: ED Triage Vitals  Enc Vitals Group     BP 02/14/17 0858 (!) 200/91     Pulse Rate 02/14/17 0858 75     Resp 02/14/17 0858 18     Temp 02/14/17 0858 98.1 F (36.7 C)     Temp Source 02/14/17 0858 Oral     SpO2 02/14/17 0858 100 %     Weight 02/14/17 0859  140 lb (63.5 kg)     Height 02/14/17 0859 '5\' 6"'$  (1.676 m)     Head Circumference --      Peak Flow --      Pain Score --      Pain Loc --      Pain Edu? --      Excl. in [place]? --     Constitutional: Alert and oriented. Well appearing and in no acute distress.She is talkative and answers questions appropriately. Eyes: Conjunctivae are normal. PERRL. EOMI. Head: Atraumatic. Nose: No congestion/rhinnorhea. Neck: No stridor.   Cardiovascular: Normal rate, regular rhythm. Grossly normal heart sounds.  Good peripheral  circulation. Respiratory: Normal respiratory effort.  No retractions. Lungs CTAB. Gastrointestinal: Soft and nontender. No distention.  Musculoskeletal: Moderate degenerative appearing right elbow. No ecchymosis or abrasions to suggest injury. Range of motion is slow but patient is able to flex and extend. Skin is warm and dry. No erythema or warmth was noted. Pulses present distally. Right knee with degenerative changes obviously present. No warmth or redness is seen. Patient is able to flex and extend slowly. Neurologic:  Normal speech and language. No gross focal neurologic deficits are appreciated. Gait was not tested secondary to patient's pain. Skin:  Skin is warm, dry and intact. No rash noted. As noted above for right elbow and right knee. Psychiatric: Mood and affect are normal. Speech and behavior are normal.  ____________________________________________   LABS (all labs ordered are listed, but only abnormal results are displayed)  Labs Reviewed - No data to display  PROCEDURES  Procedure(s) performed: None  Procedures  Critical Care performed: No  ____________________________________________   INITIAL IMPRESSION / ASSESSMENT AND PLAN / ED COURSE  Pertinent labs & imaging results that were available during my care of the patient were reviewed by me and considered in my medical decision making (see chart for details).  ----------------------------------------- 10:45 AM on 02/14/2017 ----------------------------------------- Patient was given lisinopril when he arrived to [facility].       ----------------------------------------- 11:45 AM on 02/14/2017 ----------------------------------------- Patient states that elbow pain has improved somewhat. He still continues to have some right knee pain.   Patient's family states that the pharmacy that they use is closed on Saturdays. Patient was given a prescription for lisinopril 10 mg 1 daily for 7 days. They state that they  will be able to get his medication filled on Monday. He is also to follow-up with his PCP for his osteoarthritis. The patient stated that the tramadol had not made him drowsy and had helped with his elbow pain while in the emergency department. Patient was given a note to remain out of work until 2/20. He was encouraged to follow-up and possible referral to an orthopedist for his joint problems. ____________________________________________   FINAL CLINICAL IMPRESSION(S) / ED DIAGNOSES  Final diagnoses:  Osteoarthritis of right elbow, unspecified osteoarthritis type  Osteoarthritis of right knee, unspecified osteoarthritis type  Essential hypertension      NEW MEDICATIONS STARTED DURING THIS VISIT:  New Prescriptions   LISINOPRIL (PRINIVIL,ZESTRIL) 10 MG TABLET    Take 1 tablet (10 mg total) by mouth daily.   TRAMADOL (ULTRAM) 50 MG TABLET    Take 1 tablet (50 mg total) by mouth every 6 (six) hours as needed for moderate pain.     Note:  This document was prepared using Dragon voice recognition software and may include unintentional dictation errors.    Johnn Hai, PA-C 02/14/17 [place],  MD 02/15/17 6244

## 2017-02-14 NOTE — Discharge Instructions (Signed)
Take your blood pressure medication daily. A prescription for lisinopril was written today until you are able to obtain your prescription. Tramadol as needed for pain. One every 6 hours as needed. This medication could cause drowsiness so do not take while driving or operating machinery. Follow-up with your primary care doctor about your arthritis.

## 2017-03-05 ENCOUNTER — Encounter: Payer: Self-pay | Admitting: Oncology

## 2017-03-05 ENCOUNTER — Inpatient Hospital Stay: Payer: Medicare PPO | Attending: Oncology | Admitting: Oncology

## 2017-03-05 ENCOUNTER — Inpatient Hospital Stay: Payer: Medicare PPO

## 2017-03-05 VITALS — BP 154/88 | HR 101 | Temp 97.5°F | Resp 20 | Ht 68.5 in | Wt 124.3 lb

## 2017-03-05 DIAGNOSIS — E860 Dehydration: Secondary | ICD-10-CM

## 2017-03-05 DIAGNOSIS — F1020 Alcohol dependence, uncomplicated: Secondary | ICD-10-CM

## 2017-03-05 DIAGNOSIS — M129 Arthropathy, unspecified: Secondary | ICD-10-CM

## 2017-03-05 DIAGNOSIS — R112 Nausea with vomiting, unspecified: Secondary | ICD-10-CM

## 2017-03-05 DIAGNOSIS — R630 Anorexia: Secondary | ICD-10-CM | POA: Diagnosis not present

## 2017-03-05 DIAGNOSIS — Z79899 Other long term (current) drug therapy: Secondary | ICD-10-CM | POA: Diagnosis not present

## 2017-03-05 DIAGNOSIS — D539 Nutritional anemia, unspecified: Secondary | ICD-10-CM

## 2017-03-05 DIAGNOSIS — F17210 Nicotine dependence, cigarettes, uncomplicated: Secondary | ICD-10-CM

## 2017-03-05 DIAGNOSIS — I10 Essential (primary) hypertension: Secondary | ICD-10-CM

## 2017-03-05 DIAGNOSIS — E876 Hypokalemia: Secondary | ICD-10-CM | POA: Diagnosis not present

## 2017-03-05 DIAGNOSIS — Z8619 Personal history of other infectious and parasitic diseases: Secondary | ICD-10-CM

## 2017-03-05 LAB — COMPREHENSIVE METABOLIC PANEL
AST: 16 U/L (ref 15–41)
Albumin: 3.1 g/dL — ABNORMAL LOW (ref 3.5–5.0)
Alkaline Phosphatase: 68 U/L (ref 38–126)
Anion gap: 9 (ref 5–15)
BUN: 18 mg/dL (ref 6–20)
CHLORIDE: 100 mmol/L — AB (ref 101–111)
CO2: 28 mmol/L (ref 22–32)
CREATININE: 0.77 mg/dL (ref 0.61–1.24)
Calcium: 9.3 mg/dL (ref 8.9–10.3)
GFR calc Af Amer: >60 mL/min (ref >60)
Glucose, Bld: 102 mg/dL — ABNORMAL HIGH (ref 65–99)
Potassium: 3.2 mmol/L — ABNORMAL LOW (ref 3.5–5.1)
SODIUM: 137 mmol/L (ref 135–145)
Total Bilirubin: 0.8 mg/dL (ref 0.3–1.2)
Total Protein: 8.8 g/dL — ABNORMAL HIGH (ref 6.5–8.1)

## 2017-03-05 LAB — DIFFERENTIAL
BASOS PCT: 0 %
BLASTS: 0 %
Band Neutrophils: 0 %
Basophils Absolute: 0 10*3/uL (ref 0–0.1)
Eosinophils Absolute: 0.1 10*3/uL (ref 0–0.7)
Eosinophils Relative: 2 %
LYMPHS ABS: 0.8 10*3/uL — AB (ref 1.0–3.6)
Lymphocytes Relative: 13 %
MONOS PCT: 5 %
Metamyelocytes Relative: 0 %
Monocytes Absolute: 0.3 10*3/uL (ref 0.2–1.0)
Myelocytes: 0 %
Neutro Abs: 5.2 10*3/uL (ref 1.4–6.5)
Neutrophils Relative %: 80 %
Other: 0 %
PROMYELOCYTES ABS: 0 %
nRBC: 0 /100 WBC

## 2017-03-05 LAB — CBC
HEMATOCRIT: 33.8 % — AB (ref 40.0–52.0)
Hemoglobin: 11.7 g/dL — ABNORMAL LOW (ref 13.0–18.0)
MCH: 34.2 pg — ABNORMAL HIGH (ref 26.0–34.0)
MCHC: 34.5 g/dL (ref 32.0–36.0)
MCV: 99.2 fL (ref 80.0–100.0)
PLATELETS: 407 10*3/uL (ref 150–440)
RBC: 3.41 MIL/uL — ABNORMAL LOW (ref 4.40–5.90)
RDW: 13.4 % (ref 11.5–14.5)
WBC: 6.4 10*3/uL (ref 3.8–10.6)

## 2017-03-05 LAB — TSH: TSH: 0.944 u[IU]/mL (ref 0.350–4.500)

## 2017-03-05 NOTE — Progress Notes (Signed)
Referred here by Dr Gwynneth Aliment / [facility] for abnormal labs.

## 2017-03-05 NOTE — Progress Notes (Signed)
Hematology/Oncology Consult note [facility] Telephone:([phone] [phone]) [phone]  Patient Care Team: Herminio Commons, MD as PCP - General (Family Medicine)   Name of the patient: Troy Shepherd  [id number]  Apr 17, 1946    Reason for referral- macrocytic anemia   Referring physician- Dr. Gwynneth Aliment  Date of visit: 03/05/17   History of presenting illness- Patient is a 71 year old male with multiple medical problems including history of alcoholic deficiency as well as arthritis and hypertension. Most recent CBC from 02/22/2017 showed white count of 5.9, H&H of 10.5/32.1 with an MCV of 101 and a platelet count of 477. Folic acid was normal at 5.9 and vitamin B levels were low normal at 260. Prior CBC from September 2017 showed white count of 4.6, H&H of 13.3/37.5 with an MCV of 111 and platelet count of 236. BMP was within normal limits LFTs from September 2017 were within normal limits.   Patient drinks small amounts of alcohol every other day and has been doing so for several years. 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 reposrt that he does not have much of an appetite. Denies unintentional weight loss  ECOG PS- 1  Pain scale- 0   Review of systems- Review of Systems  Constitutional: Negative for chills, fever, malaise/fatigue and weight loss.  HENT: Negative for congestion, ear discharge and nosebleeds.   Eyes: Negative for blurred vision.  Respiratory: Negative for cough, hemoptysis, sputum production, shortness of breath and wheezing.   Cardiovascular: Negative for chest pain, palpitations, orthopnea and claudication.  Gastrointestinal: Negative for abdominal pain, blood in stool, constipation, diarrhea, heartburn, melena, nausea and vomiting.  Genitourinary: Negative for dysuria, flank pain, frequency, hematuria and urgency.  Musculoskeletal: Negative for back pain, joint pain and myalgias.  Skin: Negative for rash.  Neurological: Negative for dizziness,  tingling, focal weakness, seizures, weakness and headaches.  Endo/Heme/Allergies: Does not bruise/bleed easily.  Psychiatric/Behavioral: Negative for depression and suicidal ideas. The patient does not have insomnia.     No Known Allergies  Patient Active Problem List   Diagnosis Date Noted  . Sepsis ([place]) 01/06/2016  . Hypokalemia 01/06/2016  . HTN (hypertension) 01/06/2016  . Nausea and vomiting 01/06/2016  . Dehydration 01/06/2016     Past Medical History:  Diagnosis Date  . Alcohol abuse    quit 09/2014  . Hypertension   . Leukopenia    resolved when stopped drinking 09/2014     Past Surgical History:  Procedure Laterality Date  . APPENDECTOMY    . COLONOSCOPY N/A 11/09/2015   Procedure: COLONOSCOPY;  Surgeon: Josefine Class, MD;  Location: [facility] ENDOSCOPY;  Service: Endoscopy;  Laterality: N/A;    Social History   Social History  . Marital status: Married    Spouse name: N/A  . Number of children: N/A  . Years of education: N/A   Occupational History  . Not on file.   Social History Main Topics  . Smoking status: Current Every Day Smoker    Packs/day: 0.10    Years: 40.00    Types: Cigarettes  . Smokeless tobacco: Never Used  . Alcohol use 1.8 oz/week    3 Shots of liquor per week     Comment: per pt drinks twice/d 5 days a wk  . Drug use: No  . Sexual activity: No   Other Topics Concern  . Not on file   Social History Narrative  . No narrative on file     Family History  Problem Relation Age of Onset  .  COPD Sister      Current Outpatient Prescriptions:  .  amLODipine (NORVASC) 5 MG tablet, Take by mouth daily. , Disp: , Rfl:  .  lisinopril (PRINIVIL,ZESTRIL) 10 MG tablet, Take 1 tablet (10 mg total) by mouth daily., Disp: 7 tablet, Rfl: 0 .  traMADol (ULTRAM) 50 MG tablet, Take 1 tablet (50 mg total) by mouth every 6 (six) hours as needed for moderate pain., Disp: 15 tablet, Rfl: 0 .  B Complex Vitamins (VITAMIN B COMPLEX PO), Take  by mouth., Disp: , Rfl:  .  folic acid (FOLVITE) 0.5 MG tablet, Take by mouth., Disp: , Rfl:    Physical exam:  Vitals:   03/05/17 1512  BP: (!) 154/88  Pulse: (!) 101  Resp: 20  Temp: 97.5 F (36.4 C)  TempSrc: Tympanic  Weight: 124 lb 5.4 oz (56.4 kg)  Height: 5' 8.5" (1.74 m)   Physical Exam  Constitutional: He is oriented to person, place, and time.  Thin. Does not appear to be in acute distress. Sitting in a wheelchair  HENT:  Head: Normocephalic and atraumatic.  Eyes: EOM are normal. Pupils are equal, round, and reactive to light.  Neck: Normal range of motion.  Cardiovascular: Normal rate, regular rhythm and normal heart sounds.   Pulmonary/Chest: Effort normal and breath sounds normal.  Abdominal: Soft. Bowel sounds are normal.  Neurological: He is alert and oriented to person, place, and time.  Skin: Skin is warm and dry.       CMP Latest Ref Rng & Units 01/07/2016  Glucose 65 - 99 mg/dL 104(H)  BUN 6 - 20 mg/dL 9  Creatinine 0.61 - 1.24 mg/dL 1.02  Sodium 135 - 145 mmol/L 142  Potassium 3.5 - 5.1 mmol/L 3.7  Chloride 101 - 111 mmol/L 108  CO2 22 - 32 mmol/L 29  Calcium 8.9 - 10.3 mg/dL 8.0(L)  Total Protein 6.5 - 8.1 g/dL -  Total Bilirubin 0.3 - 1.2 mg/dL -  Alkaline Phos 38 - 126 U/L -  AST 15 - 41 U/L -  ALT 17 - 63 U/L -   CBC Latest Ref Rng & Units 01/07/2016  WBC 3.8 - 10.6 K/uL 2.3(L)  Hemoglobin 13.0 - 18.0 g/dL 10.6(L)  Hematocrit 40.0 - 52.0 % 30.9(L)  Platelets 150 - 440 K/uL 224     Assessment and plan- Patient is a 71 y.o. male who has been referred to [place] for evaluation and management of macrocytic anemia  With regards to his macrocytic anemia I will do a repeat CBC with manual differential, CMP, pathology review of his smear, myeloma panel, TSH and methylmalonic acid level. I will also obtain peripheral flow cytometry. I will see the patient back in about 3 weeks' time and discuss the results of his blood work and further management. If no  overt cause of macrocytosis is evident with the above blood work, this may be secondary to underlying MDS given his age. I will monitor this conservatively with CBC every 3 months and consider bone marrow biopsy if his anemia worsens.    Thank you for this kind referral and the opportunity to participate in the care of this patient   Visit Diagnosis 1. Macrocytic anemia     Dr. Randa Evens, MD, MPH [facility] at [facility] Pager- [phone] 03/05/2017 3:53 PM

## 2017-03-06 LAB — PATHOLOGIST SMEAR REVIEW

## 2017-03-08 LAB — METHYLMALONIC ACID, SERUM: METHYLMALONIC ACID, QUANTITATIVE: 151 nmol/L (ref 0–378)

## 2017-03-10 LAB — MULTIPLE MYELOMA PANEL, SERUM
Albumin SerPl Elph-Mcnc: 3 g/dL (ref 2.9–4.4)
Albumin/Glob SerPl: 0.7 (ref 0.7–1.7)
Alpha 1: 0.4 g/dL (ref 0.0–0.4)
Alpha2 Glob SerPl Elph-Mcnc: 0.7 g/dL (ref 0.4–1.0)
B-GLOBULIN SERPL ELPH-MCNC: 1.6 g/dL — AB (ref 0.7–1.3)
Gamma Glob SerPl Elph-Mcnc: 2.2 g/dL — ABNORMAL HIGH (ref 0.4–1.8)
Globulin, Total: 4.9 g/dL — ABNORMAL HIGH (ref 2.2–3.9)
IGM, SERUM: 115 mg/dL (ref 15–143)
IgA: 1089 mg/dL — ABNORMAL HIGH (ref 61–437)
IgG (Immunoglobin G), Serum: 2430 mg/dL — ABNORMAL HIGH (ref 700–1600)
Total Protein ELP: 7.9 g/dL (ref 6.0–8.5)

## 2017-03-11 LAB — COMP PANEL: LEUKEMIA/LYMPHOMA

## 2017-03-15 ENCOUNTER — Emergency Department
Admission: EM | Admit: 2017-03-15 | Discharge: 2017-03-15 | Disposition: A | Discharge status: Home / Self Care | Payer: Medicare PPO | Attending: Emergency Medicine | Admitting: Emergency Medicine

## 2017-03-15 DIAGNOSIS — F17210 Nicotine dependence, cigarettes, uncomplicated: Secondary | ICD-10-CM | POA: Insufficient documentation

## 2017-03-15 DIAGNOSIS — I10 Essential (primary) hypertension: Secondary | ICD-10-CM | POA: Diagnosis not present

## 2017-03-15 DIAGNOSIS — M25471 Effusion, right ankle: Secondary | ICD-10-CM | POA: Insufficient documentation

## 2017-03-15 DIAGNOSIS — M7989 Other specified soft tissue disorders: Secondary | ICD-10-CM | POA: Diagnosis present

## 2017-03-15 NOTE — ED Triage Notes (Signed)
RT ARM PAIN, RT SIDE PAIN, BILAT FEET PAIN WITH SWELLING, PT HAS A DIAGNOSIS OF ARTHRITIS. PT AND HIS WIFE ARE TEARFUL. PT SCREAMING AT STAFF AND WIFE UPON ARRIVAL YELLING "I NEED TO GO TO THE BATHROOM!"

## 2017-03-15 NOTE — ED Notes (Signed)
See triage note. Pt c/o painless bil foot swelling, +2/+3 pitting edema noted to both feet. No edema noted to legs or BUE.

## 2017-03-15 NOTE — Discharge Instructions (Signed)
Please seek medical attention for any high fevers, chest pain, shortness of breath, change in behavior, persistent vomiting, bloody stool or any other new or concerning symptoms.  

## 2017-03-15 NOTE — ED Notes (Signed)
2 blankets given for comfort, no distress noted, cont to monitor

## 2017-03-15 NOTE — ED Notes (Signed)
Pt to the vending machine, ambulating without difficulty, money given for a bags of chips and drink, due to not having change to break his $10

## 2017-03-15 NOTE — ED Provider Notes (Signed)
[facility] Emergency Department Provider Note  ____________________________________________   I have reviewed the triage vital signs and the nursing notes.   HISTORY  Chief Complaint Foot swelling  History limited by: Not Limited   HPI Troy Shepherd is a 71 y.o. male who presents to the emergency department today because of concern for foot swelling. Patient states that for the past few weeks he has had swelling in various joints. For the past few days they've noticed swelling in his feet. S1 is worse on the right side. Patient denies any pain with this woman. Denies any fevers, nausea or vomiting.   Past Medical History:  Diagnosis Date  . Alcohol abuse    quit 09/2014  . Arthritis   . Hypertension   . Leukopenia    resolved when stopped drinking 09/2014    Patient Active Problem List   Diagnosis Date Noted  . Alcohol dependence ([place]) 03/05/2017  . Sepsis ([place]) 01/06/2016  . Hypokalemia 01/06/2016  . HTN (hypertension) 01/06/2016  . Nausea and vomiting 01/06/2016  . Dehydration 01/06/2016    Past Surgical History:  Procedure Laterality Date  . APPENDECTOMY    . COLONOSCOPY N/A 11/09/2015   Procedure: COLONOSCOPY;  Surgeon: Josefine Class, MD;  Location: [facility] ENDOSCOPY;  Service: Endoscopy;  Laterality: N/A;    Prior to Admission medications   Medication Sig Start Date End Date Taking? Authorizing Provider  amLODipine (NORVASC) 5 MG tablet Take by mouth daily.     Historical Provider, MD  B Complex Vitamins (VITAMIN B COMPLEX PO) Take by mouth.    Historical Provider, MD  folic acid (FOLVITE) 0.5 MG tablet Take by mouth.    Historical Provider, MD  lisinopril (PRINIVIL,ZESTRIL) 10 MG tablet Take 1 tablet (10 mg total) by mouth daily. 02/14/17 02/14/18  Johnn Hai, PA-C  traMADol (ULTRAM) 50 MG tablet Take 1 tablet (50 mg total) by mouth every 6 (six) hours as needed for moderate pain. 02/14/17   Johnn Hai, PA-C     Allergies Patient has no known allergies.  Family History  Problem Relation Age of Onset  . COPD Sister     Social History Social History  Substance Use Topics  . Smoking status: Current Every Day Smoker    Packs/day: 0.10    Years: 40.00    Types: Cigarettes  . Smokeless tobacco: Never Used  . Alcohol use 1.8 oz/week    3 Shots of liquor per week     Comment: per pt drinks twice/d 5 days a wk    Review of Systems  Constitutional: Negative for fever. Cardiovascular: Negative for chest pain. Respiratory: Negative for shortness of breath. Gastrointestinal: Negative for abdominal pain, vomiting and diarrhea. Genitourinary: Negative for dysuria. Musculoskeletal: Positive for ankle swelling. Skin: Negative for rash. Neurological: Negative for headaches, focal weakness or numbness.  10-point ROS otherwise negative.  ____________________________________________   PHYSICAL EXAM:  VITAL SIGNS: ED Triage Vitals  Enc Vitals Group     BP 03/15/17 1907 123/62     Pulse Rate 03/15/17 1907 95     Resp 03/15/17 2136 18     Temp 03/15/17 1907 98.4 F (36.9 C)     Temp Source 03/15/17 1907 Oral     SpO2 03/15/17 1907 99 %     Weight 03/15/17 1908 128 lb (58.1 kg)     Height 03/15/17 1908 '5\' 7"'$  (1.702 m)     Head Circumference --      Peak Flow --  Pain Score 03/15/17 1908 9   Constitutional: Alert and oriented. Well appearing and in no distress. Eyes: Conjunctivae are normal. Normal extraocular movements. ENT   Head: Normocephalic and atraumatic.   Nose: No congestion/rhinnorhea.   Mouth/Throat: Mucous membranes are moist.   Neck: No stridor. Hematological/Lymphatic/Immunilogical: No cervical lymphadenopathy. Cardiovascular: Normal rate, regular rhythm.  No murmurs, rubs, or gallops.  Respiratory: Normal respiratory effort without tachypnea nor retractions. Breath sounds are clear and equal bilaterally. No wheezes/rales/rhonchi. Gastrointestinal:  Soft and non tender. No rebound. No guarding.  Genitourinary: Deferred Musculoskeletal: Bilateral ankle swelling right greater than left. No erythema. No warmth. No tenderness to palpation or manipulation of the joint. DP 2+ bilaterally. Neurologic:  Normal speech and language. No gross focal neurologic deficits are appreciated.  Skin:  Skin is warm, dry and intact. No rash noted. Psychiatric: Mood and affect are normal. Speech and behavior are normal. Patient exhibits appropriate insight and judgment.  ____________________________________________    LABS (pertinent positives/negatives)  None  ____________________________________________   EKG  None  ____________________________________________    RADIOLOGY  None  ____________________________________________   PROCEDURES  Procedures  ____________________________________________   INITIAL IMPRESSION / ASSESSMENT AND PLAN / ED COURSE  Pertinent labs & imaging results that were available during my care of the patient were reviewed by me and considered in my medical decision making (see chart for details).  Patient presented to the emergency department today because of concerns for bilateral ankle swelling. No signs or symptoms concerning for septic joint. Initially no warmth or erythemachest doubt. Patient does have follow-up appointment within the week with orthopedics. Do feel patient is safe for discharge to follow up with orthopedics.  ____________________________________________   FINAL CLINICAL IMPRESSION(S) / ED DIAGNOSES  Final diagnoses:  Effusion of right ankle     Note: This dictation was prepared with Dragon dictation. Any transcriptional errors that result from this process are unintentional     Nance Pear, MD 03/15/17 2322

## 2017-03-15 NOTE — ED Notes (Signed)
Wife up to registration desk asking how much longer it will be before he is taken back to a room, wife reassured of the wait time and different locations in the ER for evaluation, wife appears angry despite reassurance

## 2017-03-24 ENCOUNTER — Inpatient Hospital Stay: Payer: Medicare PPO | Admitting: Oncology

## 2017-03-26 ENCOUNTER — Telehealth: Payer: Self-pay

## 2017-03-26 NOTE — Telephone Encounter (Signed)
Number not valid at this time. Unable to leave message.

## 2017-03-29 ENCOUNTER — Emergency Department: Payer: Medicare PPO

## 2017-03-29 ENCOUNTER — Emergency Department
Admission: EM | Admit: 2017-03-29 | Discharge: 2017-03-29 | Disposition: A | Discharge status: Home / Self Care | Payer: Medicare PPO | Attending: Emergency Medicine | Admitting: Emergency Medicine

## 2017-03-29 DIAGNOSIS — M2550 Pain in unspecified joint: Secondary | ICD-10-CM

## 2017-03-29 DIAGNOSIS — M1990 Unspecified osteoarthritis, unspecified site: Secondary | ICD-10-CM | POA: Diagnosis not present

## 2017-03-29 DIAGNOSIS — M7989 Other specified soft tissue disorders: Secondary | ICD-10-CM | POA: Diagnosis present

## 2017-03-29 DIAGNOSIS — R600 Localized edema: Secondary | ICD-10-CM | POA: Diagnosis not present

## 2017-03-29 DIAGNOSIS — I10 Essential (primary) hypertension: Secondary | ICD-10-CM | POA: Insufficient documentation

## 2017-03-29 DIAGNOSIS — F17210 Nicotine dependence, cigarettes, uncomplicated: Secondary | ICD-10-CM | POA: Diagnosis not present

## 2017-03-29 DIAGNOSIS — Z79899 Other long term (current) drug therapy: Secondary | ICD-10-CM | POA: Diagnosis not present

## 2017-03-29 LAB — BASIC METABOLIC PANEL
Anion gap: 9 (ref 5–15)
BUN: 18 mg/dL (ref 6–20)
CALCIUM: 9.2 mg/dL (ref 8.9–10.3)
CHLORIDE: 100 mmol/L — AB (ref 101–111)
CO2: 24 mmol/L (ref 22–32)
CREATININE: 1.02 mg/dL (ref 0.61–1.24)
Glucose, Bld: 80 mg/dL (ref 65–99)
Potassium: 4.6 mmol/L (ref 3.5–5.1)
SODIUM: 133 mmol/L — AB (ref 135–145)

## 2017-03-29 LAB — CBC
HCT: 31.9 % — ABNORMAL LOW (ref 40.0–52.0)
HEMOGLOBIN: 11 g/dL — AB (ref 13.0–18.0)
MCH: 34.5 pg — AB (ref 26.0–34.0)
MCHC: 34.3 g/dL (ref 32.0–36.0)
MCV: 100.6 fL — ABNORMAL HIGH (ref 80.0–100.0)
PLATELETS: 365 10*3/uL (ref 150–440)
RBC: 3.18 MIL/uL — ABNORMAL LOW (ref 4.40–5.90)
RDW: 15.6 % — AB (ref 11.5–14.5)
WBC: 6 10*3/uL (ref 3.8–10.6)

## 2017-03-29 LAB — TROPONIN I

## 2017-03-29 LAB — SEDIMENTATION RATE: SED RATE: 109 mm/h — AB (ref 0–20)

## 2017-03-29 MED ORDER — PREDNISONE 10 MG PO TABS: 10.0000 mg | ORAL_TABLET | Freq: Every day | ORAL | 0 refills | Status: DC

## 2017-03-29 MED ORDER — PREDNISONE 20 MG PO TABS
60.0000 mg | ORAL_TABLET | Freq: Once | ORAL | Status: AC
  Administered 2017-03-29: 60 mg via ORAL
  Filled 2017-03-29: qty 3

## 2017-03-29 MED ORDER — TRAMADOL HCL 50 MG PO TABS: 50.0000 mg | ORAL_TABLET | Freq: Four times a day (QID) | ORAL | 0 refills | Status: DC | PRN

## 2017-03-29 MED ORDER — TRAMADOL HCL 50 MG PO TABS
100.0000 mg | ORAL_TABLET | Freq: Once | ORAL | Status: AC
  Administered 2017-03-29: 100 mg via ORAL
  Filled 2017-03-29: qty 2

## 2017-03-29 NOTE — Discharge Instructions (Signed)
Please follow-up with orthopedics as soon as possible for recheck/reevaluation. Please take your prescribed prednisone for their entire course. Ultimately I believe you need to follow-up with rheumatology for likely arthritis. Please call the number provided to arrange a follow-up appointment.

## 2017-03-29 NOTE — ED Notes (Signed)
Pt placed on monitor. Vitals taken and given to RN

## 2017-03-29 NOTE — ED Notes (Signed)
Pt assisted to the bathroom and pts wife took over and assisted pt in the bathroom. Pt was assisted back to his bed w/o incident.

## 2017-03-29 NOTE — ED Provider Notes (Signed)
[facility] Emergency Department Provider Note  Time seen: 1:34 PM  I have reviewed the triage vital signs and the nursing notes.   HISTORY  Chief Complaint Weakness and Leg Swelling    HPI Troy Shepherd is a 71 y.o. male with a past medical history arthritis, hypertension, presents to the emergency department with right shoulder pain and right leg pain with bilateral foot swelling. According to the patient he states his feet began swelling yesterday and have never done this before. He states pain in his right thigh in addition to the lower extremity swelling. He also states beginning yesterday he has been expressing pain in the right shoulder. Denies any fever. Denies chest pain or shortness of breath. Overall the patient appears well, no distress resting comfortably in bed.  Past Medical History:  Diagnosis Date  . Alcohol abuse    quit 09/2014  . Arthritis   . Hypertension   . Leukopenia    resolved when stopped drinking 09/2014    Patient Active Problem List   Diagnosis Date Noted  . Alcohol dependence ([place]) 03/05/2017  . Sepsis ([place]) 01/06/2016  . Hypokalemia 01/06/2016  . HTN (hypertension) 01/06/2016  . Nausea and vomiting 01/06/2016  . Dehydration 01/06/2016    Past Surgical History:  Procedure Laterality Date  . APPENDECTOMY    . COLONOSCOPY N/A 11/09/2015   Procedure: COLONOSCOPY;  Surgeon: Josefine Class, MD;  Location: [facility] ENDOSCOPY;  Service: Endoscopy;  Laterality: N/A;    Prior to Admission medications   Medication Sig Start Date End Date Taking? Authorizing Provider  amLODipine (NORVASC) 5 MG tablet Take by mouth daily.     Historical Provider, MD  B Complex Vitamins (VITAMIN B COMPLEX PO) Take by mouth.    Historical Provider, MD  folic acid (FOLVITE) 0.5 MG tablet Take by mouth.    Historical Provider, MD  lisinopril (PRINIVIL,ZESTRIL) 10 MG tablet Take 1 tablet (10 mg total) by mouth daily. 02/14/17 02/14/18  Johnn Hai, PA-C  traMADol (ULTRAM) 50 MG tablet Take 1 tablet (50 mg total) by mouth every 6 (six) hours as needed for moderate pain. 02/14/17   Johnn Hai, PA-C    No Known Allergies  Family History  Problem Relation Age of Onset  . COPD Sister     Social History Social History  Substance Use Topics  . Smoking status: Current Every Day Smoker    Packs/day: 0.10    Years: 40.00    Types: Cigarettes  . Smokeless tobacco: Never Used  . Alcohol use 1.8 oz/week    3 Shots of liquor per week     Comment: per pt drinks twice/d 5 days a wk    Review of Systems Constitutional: Negative for fever. Cardiovascular: Negative for chest pain. Respiratory: Negative for shortness of breath. Gastrointestinal: Negative for abdominal pain Musculoskeletal: Right shoulder pain. Right leg pain. Bilateral foot swelling. Neurological: Negative for headache 10-point ROS otherwise negative.  ____________________________________________   PHYSICAL EXAM:  VITAL SIGNS: ED Triage Vitals  Enc Vitals Group     BP 03/29/17 1330 (!) 146/69     Pulse Rate 03/29/17 1330 82     Resp 03/29/17 1330 18     Temp 03/29/17 1330 98 F (36.7 C)     Temp Source 03/29/17 1330 Oral     SpO2 03/29/17 1330 96 %     Weight 03/29/17 1330 135 lb (61.2 kg)     Height 03/29/17 1330 5' 8"  (1.727 m)  Head Circumference --      Peak Flow --      Pain Score 03/29/17 1329 8     Pain Loc --      Pain Edu? --      Excl. in [place]? --     Constitutional: Alert and oriented. Well appearing and in no distress. Eyes: Normal exam ENT   Head: Normocephalic and atraumatic   Mouth/Throat: Mucous membranes are moist. Cardiovascular: Normal rate, regular rhythm. Respiratory: Normal respiratory effort without tachypnea nor retractions. Breath sounds are clear  Gastrointestinal: Soft and nontender. No distention.   Musculoskeletal: Moderate tenderness with right shoulder range of motion, nontender to palpation.  Neurovascularly intact distally. Mild pedal edema, equal bilaterally. DP pulse palpated bilaterally. Good sensation bilaterally. Patient does state tenderness in the right proximal thigh as well. Neurologic:  Normal speech and language. No gross focal neurologic deficits Skin:  Skin is warm, dry and intact.  Psychiatric: Mood and affect are normal.  ____________________________________________    EKG  EKG reviewed and interpreted by myself shows normal sinus rhythm at 89 bpm, narrow QRS, normal axis, normal intervals, no concerning ST changes.  ____________________________________________    RADIOLOGY  Ultrasound leg is negative for DVT. Shoulder x-rays negative.  ____________________________________________   INITIAL IMPRESSION / ASSESSMENT AND PLAN / ED COURSE  Pertinent labs & imaging results that were available during my care of the patient were reviewed by me and considered in my medical decision making (see chart for details).  Patient presents for vague complaints of new pedal edema however in reviewing the patient's records he was seen for the same complaint 2 weeks ago also belly with a normal workup. Patient has now been seen 4 times in the past 6 months for similar pain complaints and various joints. A history of arthritis the patient states he does a very physical job which he believes is the cause of his pain. Patient has orthopedic follow-up scheduled per patient but this is unknown when this will occur. We will check labs given pedal edema. We will obtain x-ray imaging of the right shoulder as well as an ultrasound of the right lower extremity. We will send an ESR given the patient's vague arthritis complaints.  Patient's ultrasound is negative. Labs are largely within normal limits, troponin negative. ESR is pending. X-rays negative. Highly suspect arthritis to be the cause of the patient's discomfort. We will discharge and a short course of Ultram, if ESR is elevated  we'll also discharge with prednisone otherwise patient will be discharged with orthopedic follow-up.  Patient's ESR is elevated to 109. We'll place the patient on prednisone and discharged with orthopedic follow-up.  ____________________________________________   FINAL CLINICAL IMPRESSION(S) / ED DIAGNOSES  Joint pain Pedal edema    Harvest Dark, MD 03/29/17 1452

## 2017-03-29 NOTE — ED Notes (Signed)
To US

## 2017-03-29 NOTE — ED Triage Notes (Signed)
Pt arrived via EMS from home with c/o feet swelling over the past day. Also reports increased weakness as well. C/o pain to right knee and right upper leg as well as right shoulder.  Pt tearful. Lives at home.

## 2017-04-22 ENCOUNTER — Telehealth: Payer: Self-pay | Admitting: *Deleted

## 2017-04-22 NOTE — Telephone Encounter (Signed)
Farris Has had been trying to call pt to r/s his no show visit. He came once and was suppose to come back to get his results of lab work up. She was unable to get him on his cell phone and unable to leave message. I tried calling today and the message on that same phone states leave a message for camisha gonzalez.  I called the only other contact on chart. The daughter answered and said he must have got  A new cell phone she will let him know the next time she sees him to call cancer center to r/s appt or let us know if he is not coming back.

## 2017-04-23 ENCOUNTER — Telehealth: Payer: Self-pay

## 2017-04-23 NOTE — Telephone Encounter (Signed)
Spoke with pt wife (Antinette) she states that she had lost paperwork with number and was unable to contact office to r/s.  Appt has been made for earliest available morning appt per patients request.

## 2017-05-07 ENCOUNTER — Emergency Department
Admission: EM | Admit: 2017-05-07 | Discharge: 2017-05-07 | Disposition: A | Discharge status: Home / Self Care | Payer: Medicare PPO | Attending: Emergency Medicine | Admitting: Emergency Medicine

## 2017-05-07 ENCOUNTER — Encounter: Payer: Self-pay | Admitting: Emergency Medicine

## 2017-05-07 ENCOUNTER — Emergency Department: Payer: Medicare PPO

## 2017-05-07 DIAGNOSIS — Z79899 Other long term (current) drug therapy: Secondary | ICD-10-CM | POA: Diagnosis not present

## 2017-05-07 DIAGNOSIS — R112 Nausea with vomiting, unspecified: Secondary | ICD-10-CM | POA: Insufficient documentation

## 2017-05-07 DIAGNOSIS — I10 Essential (primary) hypertension: Secondary | ICD-10-CM | POA: Diagnosis not present

## 2017-05-07 DIAGNOSIS — A849 Tick-borne viral encephalitis, unspecified: Secondary | ICD-10-CM | POA: Diagnosis not present

## 2017-05-07 DIAGNOSIS — M130 Polyarthritis, unspecified: Secondary | ICD-10-CM | POA: Insufficient documentation

## 2017-05-07 DIAGNOSIS — F17210 Nicotine dependence, cigarettes, uncomplicated: Secondary | ICD-10-CM | POA: Diagnosis not present

## 2017-05-07 DIAGNOSIS — B882 Other arthropod infestations: Secondary | ICD-10-CM

## 2017-05-07 DIAGNOSIS — R531 Weakness: Secondary | ICD-10-CM | POA: Diagnosis present

## 2017-05-07 DIAGNOSIS — M2550 Pain in unspecified joint: Secondary | ICD-10-CM

## 2017-05-07 LAB — URINALYSIS, COMPLETE (UACMP) WITH MICROSCOPIC
Bacteria, UA: NONE SEEN
Bilirubin Urine: NEGATIVE
Glucose, UA: NEGATIVE mg/dL
Hgb urine dipstick: NEGATIVE
KETONES UR: 5 mg/dL — AB
Leukocytes, UA: NEGATIVE
Nitrite: NEGATIVE
PH: 5 (ref 5.0–8.0)
PROTEIN: NEGATIVE mg/dL
Specific Gravity, Urine: 1.015 (ref 1.005–1.030)

## 2017-05-07 LAB — BODY FLUID CELL COUNT WITH DIFFERENTIAL
Eos, Fluid: 0 %
LYMPHS FL: 12 %
Monocyte-Macrophage-Serous Fluid: 2 %
NEUTROPHIL FLUID: 86 %
Other Cells, Fluid: 0 %
Total Nucleated Cell Count, Fluid: 973 cu mm

## 2017-05-07 LAB — CHLAMYDIA/NGC RT PCR (ARMC ONLY)
Chlamydia Tr: NOT DETECTED
N GONORRHOEAE: NOT DETECTED

## 2017-05-07 LAB — CBC
HCT: 32.3 % — ABNORMAL LOW (ref 40.0–52.0)
Hemoglobin: 11.2 g/dL — ABNORMAL LOW (ref 13.0–18.0)
MCH: 34.5 pg — AB (ref 26.0–34.0)
MCHC: 34.6 g/dL (ref 32.0–36.0)
MCV: 99.9 fL (ref 80.0–100.0)
PLATELETS: 337 10*3/uL (ref 150–440)
RBC: 3.23 MIL/uL — AB (ref 4.40–5.90)
RDW: 16.3 % — ABNORMAL HIGH (ref 11.5–14.5)
WBC: 5.8 10*3/uL (ref 3.8–10.6)

## 2017-05-07 LAB — BASIC METABOLIC PANEL
Anion gap: 6 (ref 5–15)
BUN: 39 mg/dL — ABNORMAL HIGH (ref 6–20)
CO2: 24 mmol/L (ref 22–32)
CREATININE: 1.37 mg/dL — AB (ref 0.61–1.24)
Calcium: 9.7 mg/dL (ref 8.9–10.3)
Chloride: 104 mmol/L (ref 101–111)
GFR calc non Af Amer: 50 mL/min — ABNORMAL LOW (ref >60)
GFR, EST AFRICAN AMERICAN: 58 mL/min — AB (ref >60)
GLUCOSE: 97 mg/dL (ref 65–99)
Potassium: 4.3 mmol/L (ref 3.5–5.1)
Sodium: 134 mmol/L — ABNORMAL LOW (ref 135–145)

## 2017-05-07 LAB — TROPONIN I: Troponin I: <0.03 ng/mL (ref <0.03)

## 2017-05-07 MED ORDER — ONDANSETRON 4 MG PO TBDP: 4.0000 mg | ORAL_TABLET | Freq: Three times a day (TID) | ORAL | 0 refills | Status: DC | PRN

## 2017-05-07 MED ORDER — ONDANSETRON HCL 4 MG/2ML IJ SOLN
4.0000 mg | Freq: Once | INTRAMUSCULAR | Status: AC
  Administered 2017-05-07: 4 mg via INTRAVENOUS
  Filled 2017-05-07: qty 2

## 2017-05-07 MED ORDER — SODIUM CHLORIDE 0.9 % IV BOLUS (SEPSIS)
1000.0000 mL | Freq: Once | INTRAVENOUS | Status: AC
  Administered 2017-05-07: 1000 mL via INTRAVENOUS

## 2017-05-07 MED ORDER — MORPHINE SULFATE (PF) 4 MG/ML IV SOLN
4.0000 mg | Freq: Once | INTRAVENOUS | Status: AC
  Administered 2017-05-07: 4 mg via INTRAVENOUS
  Filled 2017-05-07: qty 1

## 2017-05-07 MED ORDER — TRAMADOL HCL 50 MG PO TABS: 50.0000 mg | ORAL_TABLET | Freq: Four times a day (QID) | ORAL | 0 refills | Status: DC | PRN

## 2017-05-07 NOTE — ED Notes (Addendum)
Pt family requesting information and paperwork about healthcare POA.

## 2017-05-07 NOTE — ED Provider Notes (Signed)
[facility] Emergency Department Provider Note  ____________________________________________  Time seen: Approximately 3:45 PM  I have reviewed the triage vital signs and the nursing notes.   HISTORY  Chief Complaint Weakness   HPI Troy Shepherd is a 71 y.o. male with a history of hypertension and arthritis who presents for evaluation of generalized weakness. Patient reports 4 days ago he noticed a tick in his right groin area. He removed the tick. The next day he started having generalized weakness, several episodes of vomiting, chills, and subjective fever. Went to UC yesterday and was started on doxycycline. For the last few days he also noticed R knee pain and swelling. Patient has had a prior history of right knee pain due to arthritis but never swelling and erythema and warmth like this time. He denies any trauma to the knee. This morning his wife noted that his left ankle was also warm, red and swollen. Patient has no history of gout. Patient is complaining of bilateral hip pain, bilateral knee pain constant, progressively worse, severe, throbbing for 2-3 days. No diarrhea, no cough, no chest pain, no shortness of breath, no abdominal pain, no dysuria or hematuria, no headaches, no neck stiffness.  Past Medical History:  Diagnosis Date  . Alcohol abuse    quit 09/2014  . Arthritis   . Hypertension   . Leukopenia    resolved when stopped drinking 09/2014    Patient Active Problem List   Diagnosis Date Noted  . Alcohol dependence ([place]) 03/05/2017  . Sepsis ([place]) 01/06/2016  . Hypokalemia 01/06/2016  . HTN (hypertension) 01/06/2016  . Nausea and vomiting 01/06/2016  . Dehydration 01/06/2016    Past Surgical History:  Procedure Laterality Date  . APPENDECTOMY    . COLONOSCOPY N/A 11/09/2015   Procedure: COLONOSCOPY;  Surgeon: Josefine Class, MD;  Location: [facility] ENDOSCOPY;  Service: Endoscopy;  Laterality: N/A;    Prior to Admission  medications   Medication Sig Start Date End Date Taking? Authorizing Provider  amLODipine (NORVASC) 5 MG tablet Take by mouth daily.     [provider]  B Complex Vitamins (VITAMIN B COMPLEX PO) Take by mouth.    [provider]  folic acid (FOLVITE) 0.5 MG tablet Take by mouth.    [provider]  lisinopril (PRINIVIL,ZESTRIL) 10 MG tablet Take 1 tablet (10 mg total) by mouth daily. 02/14/17 02/14/18  Johnn Hai, PA-C  ondansetron (ZOFRAN ODT) 4 MG disintegrating tablet Take 1 tablet (4 mg total) by mouth every 8 (eight) hours as needed for nausea or vomiting. 05/07/17   Alfred Levins, [place], MD  predniSONE (DELTASONE) 10 MG tablet Take 1 tablet (10 mg total) by mouth daily. Day 1-3: take 4 tablets PO daily Day 4-6: take 3 tablets PO daily Day 7-9: take 2 tablets PO daily Day 10-12: take 1 tablet PO daily 03/29/17   Harvest Dark, MD  traMADol (ULTRAM) 50 MG tablet Take 1 tablet (50 mg total) by mouth every 6 (six) hours as needed. 05/07/17 05/07/18  Rudene Re, MD    Allergies Patient has no known allergies.  Family History  Problem Relation Age of Onset  . COPD Sister     Social History Social History  Substance Use Topics  . Smoking status: Current Every Day Smoker    Packs/day: 0.10    Years: 40.00    Types: Cigarettes  . Smokeless tobacco: Never Used  . Alcohol use 1.8 oz/week    3 Shots of liquor per  week     Comment: per pt drinks twice/d 5 days a wk    Review of Systems  Constitutional: + Subjective fever, chills, generalized weakness  Eyes: Negative for visual changes. ENT: Negative for sore throat. Neck: No neck pain  Cardiovascular: Negative for chest pain. Respiratory: Negative for shortness of breath. Gastrointestinal: Negative for abdominal pain,  diarrhea. + vomiting Genitourinary: Negative for dysuria. Musculoskeletal: Negative for back pain. + R knee and L ankle swelling and pain Skin: Negative for  rash. Neurological: Negative for headaches, weakness or numbness. Psych: No SI or HI  ____________________________________________   PHYSICAL EXAM:  VITAL SIGNS: ED Triage Vitals  Enc Vitals Group     BP 05/07/17 1213 (!) 105/58     Pulse Rate 05/07/17 1213 98     Resp 05/07/17 1213 18     Temp 05/07/17 1213 98.1 F (36.7 C)     Temp Source 05/07/17 1213 Oral     SpO2 05/07/17 1213 100 %     Weight 05/07/17 1214 140 lb (63.5 kg)     Height 05/07/17 1214 '5\' 7"'$  (1.702 m)     Head Circumference --      Peak Flow --      Pain Score 05/07/17 1216 8     Pain Loc --      Pain Edu? --      Excl. in [place]? --     Constitutional: Alert and oriented. Well appearing and in no apparent distress. HEENT:      Head: Normocephalic and atraumatic.         Eyes: Conjunctivae are normal. Sclera is non-icteric. EOMI. PERRL      Mouth/Throat: Mucous membranes are moist.       Neck: Supple with no signs of meningismus. Cardiovascular: Regular rate and rhythm. No murmurs, gallops, or rubs. 2+ symmetrical distal pulses are present in all extremities. No JVD. Respiratory: Normal respiratory effort. Lungs are clear to auscultation bilaterally. No wheezes, crackles, or rhonchi.  Gastrointestinal: Soft, non tender, and non distended with positive bowel sounds. No rebound or guarding. Musculoskeletal: Swollen, warm and tender R knee and R ankle with somewhat decreased range of motion however patient still has good range of motion of both joints  Neurologic: Normal speech and language. Face is symmetric. Moving all extremities. No gross focal neurologic deficits are appreciated. Skin: Skin is warm, dry and intact. No rash noted. Location of bite seen on the R groin with no evidence of cellulitis or abscess Psychiatric: Mood and affect are normal. Speech and behavior are normal.  ____________________________________________   LABS (all labs ordered are listed, but only abnormal results are  displayed)  Labs Reviewed  BASIC METABOLIC PANEL - Abnormal; Notable for the following:       Result Value   Sodium 134 (*)    BUN 39 (*)    Creatinine, Ser 1.37 (*)    GFR calc non Af Amer 50 (*)    GFR calc Af Amer 58 (*)    All other components within normal limits  CBC - Abnormal; Notable for the following:    RBC 3.23 (*)    Hemoglobin 11.2 (*)    HCT 32.3 (*)    MCH 34.5 (*)    RDW 16.3 (*)    All other components within normal limits  URINALYSIS, COMPLETE (UACMP) WITH MICROSCOPIC - Abnormal; Notable for the following:    Color, Urine YELLOW (*)    APPearance CLEAR (*)    Ketones,  ur 5 (*)    Squamous Epithelial / LPF 0-5 (*)    All other components within normal limits  BODY FLUID CELL COUNT WITH DIFFERENTIAL - Abnormal; Notable for the following:    Color, Fluid RED (*)    Appearance, Fluid TURBID (*)    All other components within normal limits  BODY FLUID CULTURE  CHLAMYDIA/NGC RT PCR ([facility] ONLY)  GRAM STAIN  TROPONIN I   ____________________________________________  EKG  ED ECG REPORT I, Rudene Re, the attending physician, personally viewed and interpreted this ECG.  Sinus tachycardia, rate of 102, normal intervals, normal axis, ST elevations on lateral leads with evidence of left ventricular hypertrophy probably due to early repol. Unchanged from prior.  ____________________________________________  RADIOLOGY  XR R knee: No acute osseous abnormalities. ____________________________________________   PROCEDURES  Procedure(s) performed:yes .Joint Aspiration/Arthrocentesis Date/Time: 05/07/2017 4:57 PM Performed by: Rudene Re Authorized by: Rudene Re   Consent:    Consent obtained:  Written   Consent given by:  Patient   Risks discussed:  Bleeding, infection and pain   Alternatives discussed:  No treatment Location:    Location:  Knee   Knee:  R knee Anesthesia (see MAR for exact dosages):    Anesthesia  method:  None Procedure details:    Needle gauge:  22 G   Ultrasound guidance: yes     Approach:  Medial   Aspirate amount:  3   Aspirate characteristics:  Bloody   Steroid injected: no     Specimen collected: yes   Post-procedure details:    Dressing:  Adhesive bandage   Patient tolerance of procedure:  Tolerated well, no immediate complications   Critical Care performed:  None ____________________________________________   INITIAL IMPRESSION / ASSESSMENT AND PLAN / ED COURSE   71 y.o. male with a history of hypertension and arthritis who presents for evaluation of generalized weakness, N/V, subjective fever, arthralgias and R knee and L ankle swelling and erythema since being bitten by a tick. Patient was started on doxycycline yesterday. Ddx serum sickness, tick-borne illness, septic joint. Plan for arthrocentesis and culture, labs. Will give IVF and zofran, mophine for pain.  Clinical Course as of May 07 1837  Thu May 07, 2017  1831 Synovial fluid with no crystals and no evidence of septic joint. Patient feels markedly improved. Tolerating PO. Will dc home, continue doxy, prescription for zofran given, and close f/u with PCP.    [CV]    Clinical Course User Index [CV] Rudene Re, MD    Pertinent labs & imaging results that were available during my care of the patient were reviewed by me and considered in my medical decision making (see chart for details).    ____________________________________________   FINAL CLINICAL IMPRESSION(S) / ED DIAGNOSES  Final diagnoses:  Tick-borne disease  Polyarthralgia  Non-intractable vomiting with nausea, unspecified vomiting type      NEW MEDICATIONS STARTED DURING THIS VISIT:  New Prescriptions   ONDANSETRON (ZOFRAN ODT) 4 MG DISINTEGRATING TABLET    Take 1 tablet (4 mg total) by mouth every 8 (eight) hours as needed for nausea or vomiting.   TRAMADOL (ULTRAM) 50 MG TABLET    Take 1 tablet (50 mg total) by mouth every  6 (six) hours as needed.     Note:  This document was prepared using Dragon voice recognition software and may include unintentional dictation errors.    Rudene Re, MD 05/07/17 [phone]

## 2017-05-07 NOTE — ED Triage Notes (Signed)
Pt to ED from home with family c/o weakness.  Family states bit by tick on Saturday and has become progressively more weak, pain around waist.  Pt seen at urgent care yesterday and started on doxycycline.  Pt A&Ox4, chest rise even and unlabored.

## 2017-05-07 NOTE — ED Notes (Signed)
Pt unable to urinate at this time, given specimen cup for when is able to void. Pt sent back out to lobby with wife.

## 2017-05-07 NOTE — Discharge Instructions (Signed)
Continue to take doxycycline as prescribed. Take Zofran as needed for nausea or vomiting. Follow-up with your doctor Monday. Return to the emergency room if you have new or worsening symptoms, fever, rash, severe headache, neck stiffness, or any other concerns.

## 2017-05-07 NOTE — ED Notes (Signed)
Family updated on plan of care. Awaiting treatment room. Pt alert and oriented X4, active, cooperative, pt in NAD. RR even and unlabored, color WNL.

## 2017-05-09 ENCOUNTER — Encounter: Payer: Self-pay | Admitting: Emergency Medicine

## 2017-05-09 ENCOUNTER — Observation Stay
Admission: EM | Admit: 2017-05-09 | Discharge: 2017-05-11 | Disposition: A | Discharge status: Home / Self Care | Payer: Medicare PPO | Attending: Internal Medicine | Admitting: Internal Medicine

## 2017-05-09 ENCOUNTER — Emergency Department: Payer: Medicare PPO

## 2017-05-09 DIAGNOSIS — I10 Essential (primary) hypertension: Secondary | ICD-10-CM | POA: Insufficient documentation

## 2017-05-09 DIAGNOSIS — M1990 Unspecified osteoarthritis, unspecified site: Secondary | ICD-10-CM | POA: Insufficient documentation

## 2017-05-09 DIAGNOSIS — W57XXXA Bitten or stung by nonvenomous insect and other nonvenomous arthropods, initial encounter: Secondary | ICD-10-CM | POA: Diagnosis not present

## 2017-05-09 DIAGNOSIS — R531 Weakness: Secondary | ICD-10-CM | POA: Diagnosis present

## 2017-05-09 DIAGNOSIS — Z79899 Other long term (current) drug therapy: Secondary | ICD-10-CM | POA: Insufficient documentation

## 2017-05-09 DIAGNOSIS — R262 Difficulty in walking, not elsewhere classified: Secondary | ICD-10-CM

## 2017-05-09 DIAGNOSIS — D72819 Decreased white blood cell count, unspecified: Secondary | ICD-10-CM | POA: Insufficient documentation

## 2017-05-09 DIAGNOSIS — R590 Localized enlarged lymph nodes: Secondary | ICD-10-CM

## 2017-05-09 DIAGNOSIS — S30861A Insect bite (nonvenomous) of abdominal wall, initial encounter: Secondary | ICD-10-CM

## 2017-05-09 DIAGNOSIS — R2681 Unsteadiness on feet: Secondary | ICD-10-CM | POA: Diagnosis not present

## 2017-05-09 DIAGNOSIS — I00 Rheumatic fever without heart involvement: Secondary | ICD-10-CM | POA: Diagnosis not present

## 2017-05-09 DIAGNOSIS — R509 Fever, unspecified: Secondary | ICD-10-CM | POA: Diagnosis not present

## 2017-05-09 DIAGNOSIS — M1711 Unilateral primary osteoarthritis, right knee: Secondary | ICD-10-CM | POA: Insufficient documentation

## 2017-05-09 DIAGNOSIS — B349 Viral infection, unspecified: Secondary | ICD-10-CM | POA: Diagnosis not present

## 2017-05-09 DIAGNOSIS — F17210 Nicotine dependence, cigarettes, uncomplicated: Secondary | ICD-10-CM | POA: Insufficient documentation

## 2017-05-09 DIAGNOSIS — M7989 Other specified soft tissue disorders: Secondary | ICD-10-CM | POA: Diagnosis not present

## 2017-05-09 DIAGNOSIS — E860 Dehydration: Secondary | ICD-10-CM | POA: Diagnosis not present

## 2017-05-09 LAB — COMPREHENSIVE METABOLIC PANEL
ALT: 8 U/L — AB (ref 17–63)
AST: 17 U/L (ref 15–41)
Albumin: 3.4 g/dL — ABNORMAL LOW (ref 3.5–5.0)
Alkaline Phosphatase: 48 U/L (ref 38–126)
Anion gap: 10 (ref 5–15)
BILIRUBIN TOTAL: 0.8 mg/dL (ref 0.3–1.2)
BUN: 41 mg/dL — AB (ref 6–20)
CALCIUM: 9.9 mg/dL (ref 8.9–10.3)
CO2: 21 mmol/L — ABNORMAL LOW (ref 22–32)
CREATININE: 1.47 mg/dL — AB (ref 0.61–1.24)
Chloride: 102 mmol/L (ref 101–111)
GFR calc Af Amer: 54 mL/min — ABNORMAL LOW (ref >60)
GFR, EST NON AFRICAN AMERICAN: 46 mL/min — AB (ref >60)
Glucose, Bld: 108 mg/dL — ABNORMAL HIGH (ref 65–99)
Potassium: 4.4 mmol/L (ref 3.5–5.1)
Sodium: 133 mmol/L — ABNORMAL LOW (ref 135–145)
TOTAL PROTEIN: 8.2 g/dL — AB (ref 6.5–8.1)

## 2017-05-09 LAB — CBC WITH DIFFERENTIAL/PLATELET
BASOS ABS: 0 10*3/uL (ref 0–0.1)
Basophils Relative: 0 %
EOS ABS: 0 10*3/uL (ref 0–0.7)
EOS PCT: 1 %
HCT: 30.7 % — ABNORMAL LOW (ref 40.0–52.0)
Hemoglobin: 10.6 g/dL — ABNORMAL LOW (ref 13.0–18.0)
LYMPHS PCT: 10 %
Lymphs Abs: 0.5 10*3/uL — ABNORMAL LOW (ref 1.0–3.6)
MCH: 34.8 pg — ABNORMAL HIGH (ref 26.0–34.0)
MCHC: 34.6 g/dL (ref 32.0–36.0)
MCV: 100.5 fL — AB (ref 80.0–100.0)
MONO ABS: 0.6 10*3/uL (ref 0.2–1.0)
Monocytes Relative: 11 %
Neutro Abs: 3.9 10*3/uL (ref 1.4–6.5)
Neutrophils Relative %: 78 %
Platelets: 407 10*3/uL (ref 150–440)
RBC: 3.06 MIL/uL — AB (ref 4.40–5.90)
RDW: 16 % — AB (ref 11.5–14.5)
WBC: 5.1 10*3/uL (ref 3.8–10.6)

## 2017-05-09 LAB — URINALYSIS, COMPLETE (UACMP) WITH MICROSCOPIC
Bacteria, UA: NONE SEEN
Bilirubin Urine: NEGATIVE
Glucose, UA: NEGATIVE mg/dL
Hgb urine dipstick: NEGATIVE
Ketones, ur: 5 mg/dL — AB
Leukocytes, UA: NEGATIVE
Nitrite: NEGATIVE
PROTEIN: NEGATIVE mg/dL
Specific Gravity, Urine: 1.014 (ref 1.005–1.030)
pH: 5 (ref 5.0–8.0)

## 2017-05-09 LAB — LACTIC ACID, PLASMA: LACTIC ACID, VENOUS: 1.1 mmol/L (ref 0.5–1.9)

## 2017-05-09 LAB — C-REACTIVE PROTEIN: CRP: 13.6 mg/dL — ABNORMAL HIGH (ref <1.0)

## 2017-05-09 LAB — SEDIMENTATION RATE: Sed Rate: 106 mm/hr — ABNORMAL HIGH (ref 0–20)

## 2017-05-09 MED ORDER — DOXYCYCLINE HYCLATE 100 MG PO TABS
100.0000 mg | ORAL_TABLET | Freq: Two times a day (BID) | ORAL | Status: DC
  Administered 2017-05-09 – 2017-05-11 (×5): 100 mg via ORAL
  Filled 2017-05-09 (×5): qty 1

## 2017-05-09 MED ORDER — ASPIRIN EC 81 MG PO TBEC
81.0000 mg | DELAYED_RELEASE_TABLET | Freq: Every day | ORAL | Status: DC
  Administered 2017-05-09 – 2017-05-11 (×3): 81 mg via ORAL
  Filled 2017-05-09 (×4): qty 1

## 2017-05-09 MED ORDER — HEPARIN SODIUM (PORCINE) 5000 UNIT/ML IJ SOLN
5000.0000 [IU] | Freq: Three times a day (TID) | INTRAMUSCULAR | Status: DC
  Administered 2017-05-09 – 2017-05-11 (×7): 5000 [IU] via SUBCUTANEOUS
  Filled 2017-05-09 (×7): qty 1

## 2017-05-09 MED ORDER — B COMPLEX-C PO TABS
1.0000 | ORAL_TABLET | Freq: Every day | ORAL | Status: DC
  Administered 2017-05-10 – 2017-05-11 (×2): 1 via ORAL
  Filled 2017-05-09 (×3): qty 1

## 2017-05-09 MED ORDER — BISACODYL 10 MG RE SUPP: 10.0000 mg | Freq: Every day | RECTAL | Status: DC | PRN

## 2017-05-09 MED ORDER — SODIUM CHLORIDE 0.9 % IV SOLN
INTRAVENOUS | Status: DC
  Administered 2017-05-09 – 2017-05-10 (×2): via INTRAVENOUS

## 2017-05-09 MED ORDER — ONDANSETRON HCL 4 MG PO TABS: 4.0000 mg | ORAL_TABLET | Freq: Four times a day (QID) | ORAL | Status: DC | PRN

## 2017-05-09 MED ORDER — TRAMADOL HCL 50 MG PO TABS: 50.0000 mg | ORAL_TABLET | Freq: Four times a day (QID) | ORAL | Status: DC | PRN

## 2017-05-09 MED ORDER — ONDANSETRON HCL 4 MG/2ML IJ SOLN: 4.0000 mg | Freq: Four times a day (QID) | INTRAMUSCULAR | Status: DC | PRN

## 2017-05-09 MED ORDER — ACETAMINOPHEN 650 MG RE SUPP: 650.0000 mg | Freq: Four times a day (QID) | RECTAL | Status: DC | PRN

## 2017-05-09 MED ORDER — ACETAMINOPHEN 325 MG PO TABS: 650.0000 mg | ORAL_TABLET | Freq: Four times a day (QID) | ORAL | Status: DC | PRN

## 2017-05-09 MED ORDER — DOCUSATE SODIUM 100 MG PO CAPS
100.0000 mg | ORAL_CAPSULE | Freq: Two times a day (BID) | ORAL | Status: DC
  Administered 2017-05-09 – 2017-05-11 (×4): 100 mg via ORAL
  Filled 2017-05-09 (×4): qty 1

## 2017-05-09 MED ORDER — SODIUM CHLORIDE 0.9 % IV SOLN
1000.0000 mL | Freq: Once | INTRAVENOUS | Status: AC
  Administered 2017-05-09: 1000 mL via INTRAVENOUS

## 2017-05-09 MED ORDER — METHYLPREDNISOLONE SODIUM SUCC 125 MG IJ SOLR
60.0000 mg | Freq: Two times a day (BID) | INTRAMUSCULAR | Status: DC
  Administered 2017-05-09 – 2017-05-11 (×5): 60 mg via INTRAVENOUS
  Filled 2017-05-09 (×5): qty 2

## 2017-05-09 MED ORDER — FOLIC ACID 1 MG PO TABS
1.0000 mg | ORAL_TABLET | Freq: Every day | ORAL | Status: DC
  Administered 2017-05-10 – 2017-05-11 (×2): 1 mg via ORAL
  Filled 2017-05-09 (×2): qty 1

## 2017-05-09 MED ORDER — PANTOPRAZOLE SODIUM 40 MG PO TBEC
40.0000 mg | DELAYED_RELEASE_TABLET | Freq: Every day | ORAL | Status: DC
  Administered 2017-05-09 – 2017-05-11 (×3): 40 mg via ORAL
  Filled 2017-05-09 (×3): qty 1

## 2017-05-09 MED ORDER — AMLODIPINE BESYLATE 5 MG PO TABS
5.0000 mg | ORAL_TABLET | Freq: Every day | ORAL | Status: DC
  Administered 2017-05-10: 09:00:00 5 mg via ORAL
  Filled 2017-05-09 (×2): qty 1

## 2017-05-09 MED ORDER — LISINOPRIL 10 MG PO TABS
10.0000 mg | ORAL_TABLET | Freq: Every day | ORAL | Status: DC
  Administered 2017-05-10: 10 mg via ORAL
  Filled 2017-05-09 (×2): qty 1

## 2017-05-09 NOTE — H&P (Signed)
History and Physical    Troy Shepherd SJG:[id number] DOB: [birth year] DOA: 05/09/2017  Referring physician: Dr. Corky Shepherd PCP: Troy Aliment Howell Rucks, MD  Specialists: none  Chief Complaint: weakness and fever  HPI: Troy Shepherd is a 71 y.o. male has a past medical history significant for HTN and OA with tick bite to groin 1 week ago now with fever, N/V, and weakness. Joints hurt. Cannot walk due to weakness and diffuse pain. Denies CP or SOB. No cough. No neck stiffness. Denies diarrhea. In ER, pt was weak and unable to ambulate. He is now admitted.  Review of Systems: The patient denies anorexia, weight loss,, vision loss, decreased hearing, hoarseness, chest pain, syncope, dyspnea on exertion, peripheral edema, balance deficits, hemoptysis, abdominal pain, melena, hematochezia, severe indigestion/heartburn, hematuria, incontinence, genital sores, suspicious skin lesions, transient blindness, depression, unusual weight change, abnormal bleeding, enlarged lymph nodes, angioedema, and breast masses.   Past Medical History:  Diagnosis Date  . Alcohol abuse    quit 09/2014  . Arthritis   . Hypertension   . Leukopenia    resolved when stopped drinking 09/2014   Past Surgical History:  Procedure Laterality Date  . APPENDECTOMY    . COLONOSCOPY N/A 11/09/2015   Procedure: COLONOSCOPY;  Surgeon: Troy Class, MD;  Location: [facility] ENDOSCOPY;  Service: Endoscopy;  Laterality: N/A;   Social History:  reports that he has been smoking Cigarettes.  He has a 4.00 pack-year smoking history. He has never used smokeless tobacco. He reports that he drinks about 1.8 oz of alcohol per week . He reports that he does not use drugs.  No Known Allergies  Family History  Problem Relation Age of Onset  . COPD Sister     Prior to Admission medications   Medication Sig Start Date End Date Taking? Authorizing Provider  amLODipine (NORVASC) 5 MG tablet Take by mouth daily.    Yes [provider]   B Complex Vitamins (VITAMIN B COMPLEX PO) Take by mouth.   Yes [provider]  folic acid (FOLVITE) 0.5 MG tablet Take by mouth.   Yes [provider]  lisinopril (PRINIVIL,ZESTRIL) 10 MG tablet Take 1 tablet (10 mg total) by mouth daily. 02/14/17 02/14/18 Yes Shepherd, Troy L, PA-C  ondansetron (ZOFRAN ODT) 4 MG disintegrating tablet Take 1 tablet (4 mg total) by mouth every 8 (eight) hours as needed for nausea or vomiting. 05/07/17  Yes Alfred Levins, [place], MD  traMADol (ULTRAM) 50 MG tablet Take 1 tablet (50 mg total) by mouth every 6 (six) hours as needed. 05/07/17 05/07/18 Yes Veronese, [place], MD  predniSONE (DELTASONE) 10 MG tablet Take 1 tablet (10 mg total) by mouth daily. Day 1-3: take 4 tablets PO daily Day 4-6: take 3 tablets PO daily Day 7-9: take 2 tablets PO daily Day 10-12: take 1 tablet PO daily Patient not taking: Reported on 05/09/2017 03/29/17   Harvest Dark, MD   Physical Exam: Vitals:   05/09/17 1111 05/09/17 1112 05/09/17 1200  BP: 125/65  124/66  Pulse: 89  88  Resp: 18  (!) 21  Temp: 99 F (37.2 C)    TempSrc: Oral    SpO2: 96%  96%  Weight:  63.5 kg (140 lb)   Height:  '5\' 7"'$  (1.702 m)      General:  No apparent distress, WDWN, [place]/AT  Eyes: PERRL, EOMI, no scleral icterus, conjunctiva clear  ENT: moist oropharynx without exudate, TM's benign, dentition fair  Neck: supple, no lymphadenopathy. No thyromegaly or bruits  Cardiovascular: regular rate with 2/6 systolic murmur noted. No rubs or gallops; 2+ peripheral pulses, no JVD, no peripheral edema  Respiratory: CTA biL, good air movement without wheezing, rhonchi or crackled. Respiratory effort normal  Abdomen: soft, non tender to palpation, positive bowel sounds, no guarding, no rebound  Skin: no rashes or lesions  Musculoskeletal: normal bulk and tone, no joint swelling  Psychiatric: normal mood and affect, A&OX3  Neurologic: CN 2-12 grossly intact, Motor strength 5/5 in all  4 groups with symmetric DTR's and non-focal sensory exam  Labs on Admission:  Basic Metabolic Panel:  Recent Labs Lab 05/07/17 1219 05/09/17 1131  NA 134* 133*  K 4.3 4.4  CL 104 102  CO2 24 21*  GLUCOSE 97 108*  BUN 39* 41*  CREATININE 1.37* 1.47*  CALCIUM 9.7 9.9   Liver Function Tests:  Recent Labs Lab 05/09/17 1131  AST 17  ALT 8*  ALKPHOS 48  BILITOT 0.8  PROT 8.2*  ALBUMIN 3.4*   No results for input(s): LIPASE, AMYLASE in the last 168 hours. No results for input(s): AMMONIA in the last 168 hours. CBC:  Recent Labs Lab 05/07/17 1219 05/09/17 1131  WBC 5.8 5.1  NEUTROABS  --  3.9  HGB 11.2* 10.6*  HCT 32.3* 30.7*  MCV 99.9 100.5*  PLT 337 407   Cardiac Enzymes:  Recent Labs Lab 05/07/17 1219  TROPONINI <0.03    BNP (last 3 results) No results for input(s): BNP in the last 8760 hours.  ProBNP (last 3 results) No results for input(s): PROBNP in the last 8760 hours.  CBG: No results for input(s): GLUCAP in the last 168 hours.  Radiological Exams on Admission: Dg Chest 1 View  Result Date: 05/09/2017 CLINICAL DATA:  Severe weakness.  Fever. EXAM: CHEST 1 VIEW COMPARISON:  January 06, 2016 FINDINGS: The heart size and mediastinal contours are within normal limits. Both lungs are clear. The visualized skeletal structures are unremarkable. IMPRESSION: No active disease. Electronically Signed   By: Dorise Bullion III M.D   On: 05/09/2017 11:37   Dg Knee Complete 4 Views Right  Result Date: 05/07/2017 CLINICAL DATA:  Views RIGHT knee pain for 3-4 days, worse with weight-bearing or moving EXAM: RIGHT KNEE - COMPLETE 4+ VIEW COMPARISON:  None FINDINGS: Osseous demineralization. Joint spaces preserved. No acute fracture, dislocation, or bone destruction. Question mild anterior infrapatellar soft tissue swelling. No knee joint effusion. Scattered atherosclerotic calcifications. IMPRESSION: No acute osseous abnormalities. Electronically Signed   By: Lavonia Dana M.D.   On: 05/07/2017 15:44    EKG: Independently reviewed.  Assessment/Plan Principal Problem:   Acute viral syndrome Active Problems:   Tick bite of groin   Generalized weakness   Ambulatory dysfunction   Will observe on floor with IV fluids, IV steroids, and po Doxycycline. Consult Rheumatology and Neurology. Consult PT and CM. Repeat labs in AM. Echo ordered due to fever and murmur.  Diet: clear liquids Fluids: NS'@100'$  DVT Prophylaxis: SQ Heparin  Code Status: FULL  Family Communication: none  Disposition Plan: home  Time spent: 50 min

## 2017-05-09 NOTE — ED Triage Notes (Signed)
Patient presents to the ED with severe weakness for the past 2-3 days.  Patient has had a fever since yesterday and vomiting x 1 week.  Patient reports vomiting 3-4 times today.  Patient was prescribed nausea medication and pain medication when seen in the ED two days ago but wife states, "we haven't had time to get those filled."  Patient reports being bit by a tick approx. 1 week ago to his groin area.

## 2017-05-09 NOTE — ED Provider Notes (Signed)
[facility] Emergency Department Provider Note   ____________________________________________    I have reviewed the triage vital signs and the nursing notes.   HISTORY  Chief Complaint Weakness; Leg Swelling; and Emesis     HPI Troy Shepherd is a 70 y.o. male who presents to the emergency department with complaints of diffuse weakness as well as swelling in the ankles. Patient was seen in the emergency department yesterday primarily for knee pain and weakness, the swelling apparently worsened after he left. Patient recently found a tick on him and was started on doxycycline by urgent care which he has been taking. He reports fever last night. Intermittent mild cough but no shortness of breath. No abdominal pain. Does describe some nausea but no diarrhea or vomiting. No recent travels. No history of the same. Tick bit him in the right groin and denies any redness or swelling in that area   Past Medical History:  Diagnosis Date  . Alcohol abuse    quit 09/2014  . Arthritis   . Hypertension   . Leukopenia    resolved when stopped drinking 09/2014    Patient Active Problem List   Diagnosis Date Noted  . Alcohol dependence ([place]) 03/05/2017  . Sepsis ([place]) 01/06/2016  . Hypokalemia 01/06/2016  . HTN (hypertension) 01/06/2016  . Nausea and vomiting 01/06/2016  . Dehydration 01/06/2016    Past Surgical History:  Procedure Laterality Date  . APPENDECTOMY    . COLONOSCOPY N/A 11/09/2015   Procedure: COLONOSCOPY;  Surgeon: Josefine Class, MD;  Location: [facility] ENDOSCOPY;  Service: Endoscopy;  Laterality: N/A;    Prior to Admission medications   Medication Sig Start Date End Date Taking? Authorizing Provider  amLODipine (NORVASC) 5 MG tablet Take by mouth daily.     [provider]  B Complex Vitamins (VITAMIN B COMPLEX PO) Take by mouth.    [provider]  folic acid (FOLVITE) 0.5 MG tablet Take by mouth.    [provider]  lisinopril (PRINIVIL,ZESTRIL) 10 MG tablet Take 1 tablet (10 mg total) by mouth daily. 02/14/17 02/14/18  Johnn Hai, PA-C  ondansetron (ZOFRAN ODT) 4 MG disintegrating tablet Take 1 tablet (4 mg total) by mouth every 8 (eight) hours as needed for nausea or vomiting. 05/07/17   Alfred Levins, [place], MD  predniSONE (DELTASONE) 10 MG tablet Take 1 tablet (10 mg total) by mouth daily. Day 1-3: take 4 tablets PO daily Day 4-6: take 3 tablets PO daily Day 7-9: take 2 tablets PO daily Day 10-12: take 1 tablet PO daily 03/29/17   Harvest Dark, MD  traMADol (ULTRAM) 50 MG tablet Take 1 tablet (50 mg total) by mouth every 6 (six) hours as needed. 05/07/17 05/07/18  Rudene Re, MD     Allergies Patient has no known allergies.  Family History  Problem Relation Age of Onset  . COPD Sister     Social History Social History  Substance Use Topics  . Smoking status: Current Every Day Smoker    Packs/day: 0.10    Years: 40.00    Types: Cigarettes  . Smokeless tobacco: Never Used  . Alcohol use 1.8 oz/week    3 Shots of liquor per week     Comment: per pt drinks twice/d 5 days a wk    Review of Systems  Constitutional: Positive fever last night Eyes: No visual changes.  ENT: No sore throat. Cardiovascular: Denies chest pain. Respiratory: Denies shortness of breath. Intermittent mild cough Gastrointestinal:  No abdominal pain.  No diarrhea Genitourinary: Negative for dysuria. No frequency Musculoskeletal: Negative for back pain. No neck pain. Swelling in the feet Skin: Negative for rash. Neurological: Negative for headaches   ____________________________________________   PHYSICAL EXAM:  VITAL SIGNS: ED Triage Vitals  Enc Vitals Group     BP 05/09/17 1111 125/65     Pulse Rate 05/09/17 1111 89     Resp 05/09/17 1111 18     Temp 05/09/17 1111 99 F (37.2 C)     Temp Source 05/09/17 1111 Oral     SpO2 05/09/17 1111 96 %     Weight 05/09/17 1112 140 lb  (63.5 kg)     Height 05/09/17 1112 '5\' 7"'$  (1.702 m)     Head Circumference --      Peak Flow --      Pain Score --      Pain Loc --      Pain Edu? --      Excl. in [place]? --     Constitutional: Alert and oriented. No acute distress.  Eyes: Conjunctivae are normal.   Nose: No congestion/rhinnorhea. Mouth/Throat: Mucous membranes are moist.   Cardiovascular: Normal rate, regular rhythm. Grossly normal heart sounds.  Good peripheral circulation. Respiratory: Normal respiratory effort.  No retractions. Lungs CTAB. Gastrointestinal: Soft and nontender. No distention.  No CVA tenderness. Genitourinary: deferred Musculoskeletal: Both feet are diffusely edematous, warm and well perfused, normal pulses. No evidence of cellulitis or infection. Nontender. Minimal swelling of the right knee, full range of motion without difficulty. Neurologic:  Normal speech and language. No gross focal neurologic deficits are appreciated.  Skin:  Skin is warm, dry and intact. No rash noted. Psychiatric: Mood and affect are normal. Speech and behavior are normal.  ____________________________________________   LABS (all labs ordered are listed, but only abnormal results are displayed)  Labs Reviewed  CBC WITH DIFFERENTIAL/PLATELET - Abnormal; Notable for the following:       Result Value   RBC 3.06 (*)    Hemoglobin 10.6 (*)    HCT 30.7 (*)    MCV 100.5 (*)    MCH 34.8 (*)    RDW 16.0 (*)    Lymphs Abs 0.5 (*)    All other components within normal limits  COMPREHENSIVE METABOLIC PANEL - Abnormal; Notable for the following:    Sodium 133 (*)    CO2 21 (*)    Glucose, Bld 108 (*)    BUN 41 (*)    Creatinine, Ser 1.47 (*)    Total Protein 8.2 (*)    Albumin 3.4 (*)    ALT 8 (*)    GFR calc non Af Amer 46 (*)    GFR calc Af Amer 54 (*)    All other components within normal limits  SEDIMENTATION RATE - Abnormal; Notable for the following:    Sed Rate 106 (*)    All other components within normal  limits  URINALYSIS, COMPLETE (UACMP) WITH MICROSCOPIC - Abnormal; Notable for the following:    Color, Urine YELLOW (*)    APPearance CLEAR (*)    Ketones, ur 5 (*)    Squamous Epithelial / LPF 0-5 (*)    All other components within normal limits  URINE CULTURE  LACTIC ACID, PLASMA  LACTIC ACID, PLASMA  C-REACTIVE PROTEIN   ____________________________________________  EKG  ED ECG REPORT I, Lavonia Drafts, the attending physician, personally viewed and interpreted this ECG.  Date: 05/09/2017 Rate: 95 Rhythm: normal sinus rhythm  QRS Axis: normal Intervals: normal ST/T Wave abnormalities: non specific Conduction Disturbances: none   ____________________________________________  RADIOLOGY  Chest x-ray unremarkable ____________________________________________   PROCEDURES  Procedure(s) performed: No    Critical Care performed: No ____________________________________________   INITIAL IMPRESSION / ASSESSMENT AND PLAN / ED COURSE  Pertinent labs & imaging results that were available during my care of the patient were reviewed by me and considered in my medical decision making (see chart for details).  Patient presents with diffuse weakness, bilateral foot edema. Recent tick bite, unclear if this is related at all however the patient is diffusely weak and unable to stand on his own. IV fluids given, labs overall unremarkable although he is clearly dehydrated. Feel he will require admission for further workup, IV fluids.    ____________________________________________   FINAL CLINICAL IMPRESSION(S) / ED DIAGNOSES  Final diagnoses:  Dehydration  Leg swelling  Weakness      NEW MEDICATIONS STARTED DURING THIS VISIT:  New Prescriptions   No medications on file     Note:  This document was prepared using Dragon voice recognition software and may include unintentional dictation errors.    Lavonia Drafts, MD 05/09/17 1322

## 2017-05-09 NOTE — Care Management Obs Status (Signed)
[place] NOTIFICATION   Patient Details  Name: Troy Shepherd MRN: [id number] Date of Birth: [birth year]   Medicare Observation Status Notification Given:  Yes [facility] letter given)    Mardene Speak, RN 05/09/2017, 4:08 PM

## 2017-05-09 NOTE — Evaluation (Signed)
Physical Therapy Evaluation Patient Details Name: Troy Shepherd MRN: [id number] DOB: 11/26/1946 Today's Date: 05/09/2017   History of Present Illness  Patient is a 71 y/o male with recent onset of R knee pain. He was taken to ED after a tick bite last week and fluid was taken from medial side of R knee. Unable to find results of that sample. X-rays are negative for fracture or abnormality. Patient is a very difficult historian and unable to provide much insight. He still works part time. Recent onset of swelling in feet and ankles.   Clinical Impression  Patient has a complex recent medical history with R knee pain of unclear etiology. He has excellent passive ROM though active ROM was limited by pain. Notable for mild swelling on medial joint line but no tenderness to palpation. With no trauma history ligamentous testing deferred. His pain is really only present when he flexes his knee, thus he is unable to perform sit to stand independently currently. Once in standing he is able to ambulate with and without RW with no buckling or loss of balance. He is able to perform a SLR on RLE, though quad lag noted. Would recommend SNF at this time at discharge due to difficulty with standing independently .    Follow Up Recommendations SNF    Equipment Recommendations       Recommendations for Other Services       Precautions / Restrictions Precautions Precautions: Fall Restrictions Weight Bearing Restrictions: No      Mobility  Bed Mobility Overal bed mobility: Needs Assistance Bed Mobility: Supine to Sit     Supine to sit: Min assist;Mod assist     General bed mobility comments: Patient requires assistance for trunk support to come to upright sitting.   Transfers Overall transfer level: Needs assistance Equipment used: Rolling walker (2 wheeled) Transfers: Sit to/from Stand Sit to Stand: Mod assist         General transfer comment: Patient reports both weakness and pain in R  knee with it flexed trying to stand.   Ambulation/Gait Ambulation/Gait assistance: Min guard Ambulation Distance (Feet): 125 Feet Assistive device: Rolling walker (2 wheeled) Gait Pattern/deviations: WFL(Within Functional Limits)   Gait velocity interpretation: Below normal speed for age/gender General Gait Details: Once upright patient has no apparent pain, gait speed is normal. He is able to ambulate without RW in the room, step lengths decreased. No loss of balance or buckling.   Stairs            Wheelchair Mobility    Modified Rankin (Stroke Patients Only)       Balance Overall balance assessment: Needs assistance Sitting-balance support: Bilateral upper extremity supported Sitting balance-Leahy Scale: Good     Standing balance support: No upper extremity supported Standing balance-Leahy Scale: Fair                               Pertinent Vitals/Pain Pain Assessment: Faces Faces Pain Scale: Hurts even more Pain Location: R knee (medial)  Pain Descriptors / Indicators: Aching Pain Intervention(s): Limited activity within patient's tolerance;Monitored during session;Repositioned    Home Living Family/patient expects to be discharged to:: Private residence Living Arrangements: Spouse/significant other Available Help at Discharge: Family Type of Home: House         Home Equipment: Gilford Rile - 2 wheels      Prior Function Level of Independence: Independent         Comments:  Patient is a difficult historian, he does say he occasionally uses a RW, but largely ambulates independently.      Hand Dominance        Extremity/Trunk Assessment        Lower Extremity Assessment Lower Extremity Assessment: RLE deficits/detail RLE Deficits / Details: Able to actively bend to 90 before painful, passively to 120 before pain. Full extension has no pain. Swelling and closure stitching on medial portion of R knee. L knee flexion to 130. Not tender to  palpation.        Communication   Communication: No difficulties  Cognition Arousal/Alertness: Awake/alert Behavior During Therapy: Flat affect Overall Cognitive Status: No family/caregiver present to determine baseline cognitive functioning                                 General Comments: Patient provides information that appears mostly accurate, but minimal detail/insight. Flat affect.       General Comments General comments (skin integrity, edema, etc.): Mild swelling on medial portion of R knee. Swelling in feet/ankles.     Exercises     Assessment/Plan    PT Assessment Patient needs continued PT services  PT Problem List Decreased strength;Decreased mobility;Pain;Decreased balance;Decreased knowledge of use of DME;Decreased activity tolerance;Decreased range of motion       PT Treatment Interventions DME instruction;Therapeutic activities;Therapeutic exercise;Gait training;Manual techniques;Neuromuscular re-education;Balance training;Stair training    PT Goals (Current goals can be found in the Care Plan section)  Acute Rehab PT Goals Patient Stated Goal: To havew less pain in his knee  PT Goal Formulation: With patient Time For Goal Achievement: 05/23/17 Potential to Achieve Goals: Good    Frequency Min 2X/week   Barriers to discharge        Co-evaluation               AM-PAC PT "6 Clicks" Daily Activity  Outcome Measure Difficulty turning over in bed (including adjusting bedclothes, sheets and blankets)?: A Little Difficulty moving from lying on back to sitting on the side of the bed? : A Little Difficulty sitting down on and standing up from a chair with arms (e.g., wheelchair, bedside commode, etc,.)?: A Lot Help needed moving to and from a bed to chair (including a wheelchair)?: A Little Help needed walking in hospital room?: A Little Help needed climbing 3-5 steps with a railing? : Total 6 Click Score: 15    End of Session  Equipment Utilized During Treatment: Gait belt Activity Tolerance: Patient tolerated treatment well Patient left: in chair;with chair alarm set;with call bell/phone within reach Nurse Communication: Mobility status PT Visit Diagnosis: Unsteadiness on feet (R26.81);Pain    Time: 1478-2956 PT Time Calculation (min) (ACUTE ONLY): 20 min   Charges:   PT Evaluation $PT Eval Moderate Complexity: 1 Procedure     PT G Codes:   PT G-Codes **NOT FOR INPATIENT CLASS** Functional Assessment Tool Used: AM-PAC 6 Clicks Basic Mobility Functional Limitation: Mobility: Walking and moving around Mobility: Walking and Moving Around Current Status (O1308): At least 40 percent but less than 60 percent impaired, limited or restricted Mobility: Walking and Moving Around Goal Status [phone]): At least 1 percent but less than 20 percent impaired, limited or restricted   Royce Macadamia PT, DPT, CSCS    05/09/2017, 5:10 PM

## 2017-05-09 NOTE — Progress Notes (Signed)
Pt gives limited history recall. Thinks his feet/ankle edema began about 2 weeks ago. Reports had tick bite approximately 1 week ago in right groin area with no abnormal site noted with pt placed on antibiotics. Reports had fluid drawn out of his right knee maybe "Thursday" with onset of fever, multiple joint pain/muscle weakness to where "I'm having trouble getting up". Pt reported pain/weakness in arms upon pushing to assess strength;reported right knee pain with movement. Reports he works partime on a [employer]. Declined pain med at this time.Reports he had to start using a walker at home last night.

## 2017-05-10 ENCOUNTER — Observation Stay
Admit: 2017-05-10 | Discharge: 2017-05-10 | Disposition: A | Discharge status: Home / Self Care | Payer: Medicare PPO | Attending: Internal Medicine | Admitting: Internal Medicine

## 2017-05-10 DIAGNOSIS — B349 Viral infection, unspecified: Secondary | ICD-10-CM | POA: Diagnosis not present

## 2017-05-10 LAB — COMPREHENSIVE METABOLIC PANEL
ALT: 8 U/L — AB (ref 17–63)
ANION GAP: 8 (ref 5–15)
AST: 15 U/L (ref 15–41)
Albumin: 2.7 g/dL — ABNORMAL LOW (ref 3.5–5.0)
Alkaline Phosphatase: 39 U/L (ref 38–126)
BUN: 28 mg/dL — ABNORMAL HIGH (ref 6–20)
CHLORIDE: 104 mmol/L (ref 101–111)
CO2: 21 mmol/L — AB (ref 22–32)
CREATININE: 1.13 mg/dL (ref 0.61–1.24)
Calcium: 9 mg/dL (ref 8.9–10.3)
Glucose, Bld: 151 mg/dL — ABNORMAL HIGH (ref 65–99)
POTASSIUM: 4.3 mmol/L (ref 3.5–5.1)
Sodium: 133 mmol/L — ABNORMAL LOW (ref 135–145)
Total Bilirubin: 0.5 mg/dL (ref 0.3–1.2)
Total Protein: 6.9 g/dL (ref 6.5–8.1)

## 2017-05-10 LAB — URINE CULTURE: Culture: NO GROWTH

## 2017-05-10 LAB — CBC
HCT: 29.2 % — ABNORMAL LOW (ref 40.0–52.0)
HEMOGLOBIN: 10.1 g/dL — AB (ref 13.0–18.0)
MCH: 34.7 pg — AB (ref 26.0–34.0)
MCHC: 34.7 g/dL (ref 32.0–36.0)
MCV: 100.1 fL — AB (ref 80.0–100.0)
PLATELETS: 404 10*3/uL (ref 150–440)
RBC: 2.91 MIL/uL — AB (ref 4.40–5.90)
RDW: 16 % — ABNORMAL HIGH (ref 11.5–14.5)
WBC: 2 10*3/uL — AB (ref 3.8–10.6)

## 2017-05-10 LAB — GLUCOSE, CAPILLARY: Glucose-Capillary: 177 mg/dL — ABNORMAL HIGH (ref 65–99)

## 2017-05-10 LAB — VITAMIN B12: Vitamin B-12: 217 pg/mL (ref 180–914)

## 2017-05-10 LAB — FOLATE: FOLATE: 25 ng/mL (ref >5.9)

## 2017-05-10 MED ORDER — SODIUM CHLORIDE 0.9% FLUSH
3.0000 mL | Freq: Two times a day (BID) | INTRAVENOUS | Status: DC
  Administered 2017-05-10 – 2017-05-11 (×5): 3 mL via INTRAVENOUS

## 2017-05-10 MED ORDER — SODIUM CHLORIDE 0.9% FLUSH: 3.0000 mL | INTRAVENOUS | Status: DC | PRN

## 2017-05-10 NOTE — Progress Notes (Signed)
[facility] [place] NAME: Troy Shepherd    MR#:  [id number]  DATE OF BIRTH:  05/16/1946  SUBJECTIVE:  CHIEF COMPLAINT:   Chief Complaint  Patient presents with  . Weakness  . Leg Swelling  . Emesis   Afebrile. Feels stronger today. No nausea or vomiting. No rash. No dysuria or cough  REVIEW OF SYSTEMS:    Review of Systems  Constitutional: Positive for malaise/fatigue. Negative for chills and fever.  HENT: Negative for sore throat.   Eyes: Negative for blurred vision, double vision and pain.  Respiratory: Negative for cough, hemoptysis, shortness of breath and wheezing.   Cardiovascular: Negative for chest pain, palpitations, orthopnea and leg swelling.  Gastrointestinal: Negative for abdominal pain, constipation, diarrhea, heartburn, nausea and vomiting.  Genitourinary: Negative for dysuria and hematuria.  Musculoskeletal: Positive for joint pain and myalgias. Negative for back pain.  Skin: Negative for rash.  Neurological: Positive for weakness. Negative for sensory change, speech change, focal weakness and headaches.  Endo/Heme/Allergies: Does not bruise/bleed easily.  Psychiatric/Behavioral: Negative for depression. The patient is not nervous/anxious.     DRUG ALLERGIES:  No Known Allergies  VITALS:  Blood pressure 115/66, pulse 67, temperature 98 F (36.7 C), temperature source Oral, resp. rate 20, height '5\' 7"'$  (1.702 m), weight 58.8 kg (129 lb 11.2 oz), SpO2 97 %.  PHYSICAL EXAMINATION:   Physical Exam  GENERAL:  71 y.o.-year-old patient lying in the bed with no acute distress.  EYES: Pupils equal, round, reactive to light and accommodation. No scleral icterus. Extraocular muscles intact.  HEENT: Head atraumatic, normocephalic. Oropharynx and nasopharynx clear.  NECK:  Supple, no jugular venous distention. No thyroid enlargement, no tenderness.  LUNGS: Normal breath sounds bilaterally, no wheezing, rales, rhonchi. No use  of accessory muscles of respiration.  CARDIOVASCULAR: S1, S2 normal. No murmurs, rubs, or gallops.  ABDOMEN: Soft, nontender, nondistended. Bowel sounds present. No organomegaly or mass.  EXTREMITIES: No cyanosis, clubbing or edema b/l.    NEUROLOGIC: Cranial nerves II through XII are intact. No focal Motor or sensory deficits b/l.   PSYCHIATRIC: The patient is alert and oriented x 3.  SKIN: No obvious rash, lesion, or ulcer.   LABORATORY PANEL:   CBC  Recent Labs Lab 05/10/17 0421  WBC 2.0*  HGB 10.1*  HCT 29.2*  PLT 404   ------------------------------------------------------------------------------------------------------------------ Chemistries   Recent Labs Lab 05/10/17 0421  NA 133*  K 4.3  CL 104  CO2 21*  GLUCOSE 151*  BUN 28*  CREATININE 1.13  CALCIUM 9.0  AST 15  ALT 8*  ALKPHOS 39  BILITOT 0.5   ------------------------------------------------------------------------------------------------------------------  Cardiac Enzymes  Recent Labs Lab 05/07/17 1219  TROPONINI <0.03   ------------------------------------------------------------------------------------------------------------------  RADIOLOGY:  Dg Chest 1 View  Result Date: 05/09/2017 CLINICAL DATA:  Severe weakness.  Fever. EXAM: CHEST 1 VIEW COMPARISON:  January 06, 2016 FINDINGS: The heart size and mediastinal contours are within normal limits. Both lungs are clear. The visualized skeletal structures are unremarkable. IMPRESSION: No active disease. Electronically Signed   By: Dorise Bullion III M.D   On: 05/09/2017 11:37     ASSESSMENT AND PLAN:   * Tick borne illness On Doxycycline Check for lyme disease Consult ID Elevated CRP  * Leucopenia likely from acute illness  * HTN Home meds  * Generalized weakness Due to acute illness. Consult physical therapy. Slowly improving.  * Right knee arthritis. Had arthrocentesis. Septic arthritis ruled out.  All the records  are reviewed  and case discussed with Care Management/Social Worker Management plans discussed with the patient, family and they are in agreement.  CODE STATUS: FULL CODE  DVT Prophylaxis: SCDs  TOTAL TIME TAKING CARE OF THIS PATIENT: 35 minutes.   POSSIBLE D/C IN 1-2 DAYS, DEPENDING ON CLINICAL CONDITION.  Hillary Bow R M.D on 05/10/2017 at 10:56 AM  Between 7am to 6pm - Pager - [phone]  After 6pm go to [email or web address] - password [email or web address] [facility]  Office  [phone]  CC: Primary care physician; Herminio Commons, MD  Note: This dictation was prepared with Dragon dictation along with smaller phrase technology. Any transcriptional errors that result from this process are unintentional.

## 2017-05-10 NOTE — NC FL2 (Signed)
[place] LEVEL OF CARE SCREENING TOOL     IDENTIFICATION  Patient Name: Troy Shepherd Birthdate: 07/27/1946 Sex: male Admission Date (Current Location): 05/09/2017  [place] and [place] Number:  Engineering geologist and Address:  [facility], [address] [place], [place] [address]      Provider Number: [id number]  Attending Physician Name and Address:  Hillary Bow, MD  Relative Name and Phone Number:       Current Level of Care: Hospital Recommended Level of Care: [place] Prior Approval Number:    Date Approved/Denied: 05/10/17 PASRR Number: [id number]  Discharge Plan: SNF    Current Diagnoses: Patient Active Problem List   Diagnosis Date Noted  . Acute viral syndrome 05/09/2017  . Tick bite of groin 05/09/2017  . Generalized weakness 05/09/2017  . Ambulatory dysfunction 05/09/2017  . Alcohol dependence ([place]) 03/05/2017  . Sepsis ([place]) 01/06/2016  . Hypokalemia 01/06/2016  . HTN (hypertension) 01/06/2016  . Nausea and vomiting 01/06/2016  . Dehydration 01/06/2016    Orientation RESPIRATION BLADDER Height & Weight     Self, Situation, Time, Place  Normal Continent Weight: 129 lb 11.2 oz (58.8 kg) Height:  '5\' 7"'$  (170.2 cm)  BEHAVIORAL SYMPTOMS/MOOD NEUROLOGICAL BOWEL NUTRITION STATUS      Continent    AMBULATORY STATUS COMMUNICATION OF NEEDS Skin   Extensive Assist Verbally Normal                       Personal Care Assistance Level of Assistance  Bathing, Feeding, Dressing Bathing Assistance: Limited assistance Feeding assistance: Independent Dressing Assistance: Limited assistance     Functional Limitations Info             SPECIAL CARE FACTORS FREQUENCY  PT (By licensed PT)     PT Frequency: Up to 5X per day, 5 days per week              Contractures      Additional Factors Info                  Current Medications (05/10/2017):  This is the current hospital  active medication list Current Facility-Administered Medications  Medication Dose Route Frequency Provider Last Rate Last Dose  . acetaminophen (TYLENOL) tablet 650 mg  650 mg Oral Q6H PRN Idelle Crouch, MD       Or  . acetaminophen (TYLENOL) suppository 650 mg  650 mg Rectal Q6H PRN Idelle Crouch, MD      . amLODipine (NORVASC) tablet 5 mg  5 mg Oral Daily Idelle Crouch, MD   5 mg at 05/10/17 [phone]  . aspirin EC tablet 81 mg  81 mg Oral Daily Idelle Crouch, MD   81 mg at 05/10/17 0840  . B-complex with vitamin C tablet 1 tablet  1 tablet Oral Daily Idelle Crouch, MD   1 tablet at 05/10/17 0841  . bisacodyl (DULCOLAX) suppository 10 mg  10 mg Rectal Daily PRN Idelle Crouch, MD      . docusate sodium (COLACE) capsule 100 mg  100 mg Oral BID Idelle Crouch, MD   100 mg at 05/10/17 0839  . doxycycline (VIBRA-TABS) tablet 100 mg  100 mg Oral Q12H Idelle Crouch, MD   100 mg at 05/10/17 0844  . folic acid (FOLVITE) tablet 1 mg  1 mg Oral Daily Idelle Crouch, MD   1 mg at 05/10/17 0841  . heparin  injection 5,000 Units  5,000 Units Subcutaneous Q8H Idelle Crouch, MD   5,000 Units at 05/10/17 0504  . lisinopril (PRINIVIL,ZESTRIL) tablet 10 mg  10 mg Oral Daily Idelle Crouch, MD   10 mg at 05/10/17 0840  . methylPREDNISolone sodium succinate (SOLU-MEDROL) 125 mg/2 mL injection 60 mg  60 mg Intravenous Q12H Idelle Crouch, MD   60 mg at 05/10/17 0103  . ondansetron (ZOFRAN) tablet 4 mg  4 mg Oral Q6H PRN Idelle Crouch, MD       Or  . ondansetron (ZOFRAN) injection 4 mg  4 mg Intravenous Q6H PRN Idelle Crouch, MD      . pantoprazole (PROTONIX) EC tablet 40 mg  40 mg Oral Daily Idelle Crouch, MD   40 mg at 05/10/17 0840  . sodium chloride flush (NS) 0.9 % injection 3 mL  3 mL Intravenous Q12H Sudini, Srikar, MD   3 mL at 05/10/17 1017  . traMADol (ULTRAM) tablet 50 mg  50 mg Oral Q6H PRN Idelle Crouch, MD         Discharge Medications: Please  see discharge summary for a list of discharge medications.  Relevant Imaging Results:  Relevant Lab Results:   Additional Information SS# [id number]  Zettie Pho, LCSW

## 2017-05-10 NOTE — Consult Note (Signed)
Reason for Consult:Weakness Referring Physician: Sudini  CC: Weakness  HPI: Troy Shepherd is an 71 y.o. male who reports that about a week ago he noted a tick on him.  After than he reports that he has been weak.  Reports at times the weakness is so bad that he is unable to get up from a seated position.  Also reports that he has had difficulty walking and felt off balance. He feels the difficulty walking is due to pain and swelling in his joints.  Noticed a fever on one occasion during the week.    Past Medical History:  Diagnosis Date  . Alcohol abuse    quit 09/2014  . Arthritis   . Hypertension   . Leukopenia    resolved when stopped drinking 09/2014    Past Surgical History:  Procedure Laterality Date  . APPENDECTOMY    . COLONOSCOPY N/A 11/09/2015   Procedure: COLONOSCOPY;  Surgeon: Josefine Class, MD;  Location: [facility] ENDOSCOPY;  Service: Endoscopy;  Laterality: N/A;    Family History  Problem Relation Age of Onset  . COPD Sister     Social History:  reports that he has been smoking Cigarettes.  He has a 4.00 pack-year smoking history. He has never used smokeless tobacco. He reports that he drinks about 1.8 oz of alcohol per week . He reports that he does not use drugs.  No Known Allergies  Medications:  I have reviewed the patient's current medications. Prior to Admission:  Prescriptions Prior to Admission  Medication Sig Dispense Refill Last Dose  . amLODipine (NORVASC) 5 MG tablet Take by mouth daily.    05/09/2017 at 0900  . B Complex Vitamins (VITAMIN B COMPLEX PO) Take by mouth.   05/09/2017 at 0900  . folic acid (FOLVITE) 0.5 MG tablet Take by mouth.   05/09/2017 at 0900  . lisinopril (PRINIVIL,ZESTRIL) 10 MG tablet Take 1 tablet (10 mg total) by mouth daily. 7 tablet 0 05/09/2017 at 0900  . ondansetron (ZOFRAN ODT) 4 MG disintegrating tablet Take 1 tablet (4 mg total) by mouth every 8 (eight) hours as needed for nausea or vomiting. 20 tablet 0 prn at prn  .  traMADol (ULTRAM) 50 MG tablet Take 1 tablet (50 mg total) by mouth every 6 (six) hours as needed. 20 tablet 0 prn at prn  . predniSONE (DELTASONE) 10 MG tablet Take 1 tablet (10 mg total) by mouth daily. Day 1-3: take 4 tablets PO daily Day 4-6: take 3 tablets PO daily Day 7-9: take 2 tablets PO daily Day 10-12: take 1 tablet PO daily (Patient not taking: Reported on 05/09/2017) 30 tablet 0 Not Taking at Unknown time   Scheduled: . amLODipine  5 mg Oral Daily  . aspirin EC  81 mg Oral Daily  . B-complex with vitamin C  1 tablet Oral Daily  . docusate sodium  100 mg Oral BID  . doxycycline  100 mg Oral Q12H  . folic acid  1 mg Oral Daily  . heparin  5,000 Units Subcutaneous Q8H  . lisinopril  10 mg Oral Daily  . methylPREDNISolone (SOLU-MEDROL) injection  60 mg Intravenous Q12H  . pantoprazole  40 mg Oral Daily  . sodium chloride flush  3 mL Intravenous Q12H    ROS: History obtained from the patient  General ROS: as noted in HPI Psychological ROS: negative for - behavioral disorder, hallucinations, memory difficulties, mood swings or suicidal ideation Ophthalmic ROS: negative for - blurry vision, double vision, eye  pain or loss of vision ENT ROS: negative for - epistaxis, nasal discharge, oral lesions, sore throat, tinnitus or vertigo Allergy and Immunology ROS: negative for - hives or itchy/watery eyes Hematological and Lymphatic ROS: negative for - bleeding problems, bruising or swollen lymph nodes Endocrine ROS: negative for - galactorrhea, hair pattern changes, polydipsia/polyuria or temperature intolerance Respiratory ROS: negative for - cough, hemoptysis, shortness of breath or wheezing Cardiovascular ROS: negative for - chest pain, dyspnea on exertion, edema or irregular heartbeat Gastrointestinal ROS: negative for - abdominal pain, diarrhea, hematemesis, nausea/vomiting or stool incontinence Genito-Urinary ROS: negative for - dysuria, hematuria, incontinence or urinary  frequency/urgency Musculoskeletal ROS: as noted in HPI Neurological ROS: as noted in HPI Dermatological ROS: negative for rash and skin lesion changes  Physical Examination: Blood pressure 115/66, pulse 67, temperature 98 F (36.7 C), temperature source Oral, resp. rate 20, height '5\' 7"'$  (1.702 m), weight 58.8 kg (129 lb 11.2 oz), SpO2 97 %.  HEENT-  Normocephalic, no lesions, without obvious abnormality.  Normal external eye and conjunctiva.  Normal TM's bilaterally.  Normal auditory canals and external ears. Normal external nose, mucus membranes and septum.  Normal pharynx. Cardiovascular- S1, S2 normal, pulses palpable throughout   Lungs- chest clear, no wheezing, rales, normal symmetric air entry Abdomen- soft, non-tender; bowel sounds normal; no masses,  no organomegaly Extremities- no edema Lymph-no adenopathy palpable Musculoskeletal-no joint tenderness, deformity or swelling Skin-warm and dry, no hyperpigmentation, vitiligo, or suspicious lesions  Neurological Examination   Mental Status: Alert, oriented, thought content appropriate.  Speech fluent without evidence of aphasia.  Able to follow 3 step commands without difficulty. Cranial Nerves: II: Discs flat bilaterally; Visual fields grossly normal, pupils equal, round, reactive to light and accommodation III,IV, VI: ptosis not present, extra-ocular motions intact bilaterally V,VII: smile symmetric, facial light touch sensation normal bilaterally VIII: hearing normal bilaterally IX,X: gag reflex present XI: bilateral shoulder shrug XII: midline tongue extension Motor: Right : Upper extremity   5/5    Left:     Upper extremity   5/5  Lower extremity   5/5     Lower extremity   5/5 Tone and bulk:normal tone throughout; no atrophy noted Sensory: Pinprick and light touch intact throughout, bilaterally Deep Tendon Reflexes: 2+ in the upper extremities, 1+ at the knees and absent at the ankles Plantars: Right: downgoing   Left:  downgoing Cerebellar: Normal finger-to-nose and normal heel-to-shin testing bilaterally Gait: Gait mildly wide based.  Patient able to rise from the bed unassisted   Laboratory Studies:   Basic Metabolic Panel:  Recent Labs Lab 05/07/17 1219 05/09/17 1131 05/10/17 0421  NA 134* 133* 133*  K 4.3 4.4 4.3  CL 104 102 104  CO2 24 21* 21*  GLUCOSE 97 108* 151*  BUN 39* 41* 28*  CREATININE 1.37* 1.47* 1.13  CALCIUM 9.7 9.9 9.0    Liver Function Tests:  Recent Labs Lab 05/09/17 1131 05/10/17 0421  AST 17 15  ALT 8* 8*  ALKPHOS 48 39  BILITOT 0.8 0.5  PROT 8.2* 6.9  ALBUMIN 3.4* 2.7*   No results for input(s): LIPASE, AMYLASE in the last 168 hours. No results for input(s): AMMONIA in the last 168 hours.  CBC:  Recent Labs Lab 05/07/17 1219 05/09/17 1131 05/10/17 0421  WBC 5.8 5.1 2.0*  NEUTROABS  --  3.9  --   HGB 11.2* 10.6* 10.1*  HCT 32.3* 30.7* 29.2*  MCV 99.9 100.5* 100.1*  PLT 337 407 404    Cardiac  Enzymes:  Recent Labs Lab 05/07/17 1219  TROPONINI <0.03    BNP: Invalid input(s): POCBNP  CBG:  Recent Labs Lab 05/10/17 0740  GLUCAP 177*    Microbiology: Results for orders placed or performed during the hospital encounter of 05/07/17  Body fluid culture     Status: None (Preliminary result)   Collection Time: 05/07/17  4:54 PM  Result Value Ref Range Status   Specimen Description KNEE  Final   Special Requests NONE  Final   Gram Stain   Final    FEW WBC PRESENT,BOTH PMN AND MONONUCLEAR NO ORGANISMS SEEN    Culture   Final    NO GROWTH 3 DAYS Performed at [facility], [address], [place], [place] [address]    Report Status PENDING  Incomplete  Chlamydia/NGC rt PCR ([place] only)     Status: None   Collection Time: 05/07/17  5:30 PM  Result Value Ref Range Status   Specimen source GC/Chlam URINE, RANDOM  Final   Chlamydia Tr NOT DETECTED NOT DETECTED Final   N gonorrhoeae NOT DETECTED NOT DETECTED Final    Comment:  (NOTE) 100  This methodology has not been evaluated in pregnant women or in 200  patients with a history of hysterectomy. 300 400  This methodology will not be performed on patients less than 80  years of age.     Coagulation Studies: No results for input(s): LABPROT, INR in the last 72 hours.  Urinalysis:  Recent Labs Lab 05/07/17 1219 05/09/17 1133  COLORURINE YELLOW* YELLOW*  LABSPEC 1.015 1.014  PHURINE 5.0 5.0  GLUCOSEU NEGATIVE NEGATIVE  HGBUR NEGATIVE NEGATIVE  BILIRUBINUR NEGATIVE NEGATIVE  KETONESUR 5* 5*  PROTEINUR NEGATIVE NEGATIVE  NITRITE NEGATIVE NEGATIVE  LEUKOCYTESUR NEGATIVE NEGATIVE    Lipid Panel:  No results found for: CHOL, TRIG, HDL, CHOLHDL, VLDL, LDLCALC  HgbA1C: No results found for: HGBA1C  Urine Drug Screen:  No results found for: LABOPIA, COCAINSCRNUR, LABBENZ, AMPHETMU, THCU, LABBARB  Alcohol Level: No results for input(s): ETH in the last 168 hours.  Other results: EKG: sinus rhythm at 95 bpm.  Imaging: Dg Chest 1 View  Result Date: 05/09/2017 CLINICAL DATA:  Severe weakness.  Fever. EXAM: CHEST 1 VIEW COMPARISON:  January 06, 2016 FINDINGS: The heart size and mediastinal contours are within normal limits. Both lungs are clear. The visualized skeletal structures are unremarkable. IMPRESSION: No active disease. Electronically Signed   By: Dorise Bullion III M.D   On: 05/09/2017 11:37     Assessment/Plan: Patient much improved.  Able to get out of bed without assistance.  Reports gait is at baseline as well.  On Doxycycline.  May want to get lyme and RMSF titers.    No further neurologic intervention is recommended at this time.  If further questions arise, please call or page at that time.  Thank you for allowing neurology to participate in the care of this patient.   Alexis Goodell, MD Neurology [phone] 05/10/2017, 11:08 AM

## 2017-05-10 NOTE — Clinical Social Work Note (Signed)
Clinical Social Work Assessment  Patient Details  Name: Troy Shepherd MRN: [id number] Date of Birth: 1946/12/25  Date of referral:  05/10/17               Reason for consult:  Facility Placement                Permission sought to share information with:  Chartered certified accountant granted to share information::  Yes, Verbal Permission Granted  Name::        Agency::     Relationship::     Contact Information:     Housing/Transportation Living arrangements for the past 2 months:  Single Family Home Source of Information:  Patient Patient Interpreter Needed:  None Criminal Activity/Legal Involvement Pertinent to Current Situation/Hospitalization:  No - Comment as needed Significant Relationships:  Adult Children, Spouse Lives with:  Spouse Do you feel safe going back to the place where you live?  Yes Need for family participation in patient care:  No (Coment)  Care giving concerns: PT recommendation for SNF   Social Worker assessment / plan: CSW met with the patient at bedside to discuss PT recommendation and discharge planning. The patient gave verbal permission to conduct a bed search in the [place] area. The patient indicated that he may require transportation to a facility via wheelchair van at time of discharge.  At baseline, the patient is independent with ADLs and IADLs, and he reports that his wife assists during times of non-baseline weakness. The patient lives with his wife and reports a strong relationship with his family.  CSW will continue to assist with discharge facilitation as bed offers are available.  Employment status:  Retired Nurse, adult PT Recommendations:  [place] / Referral to community resources:  [place]  Patient/Family's Response to care:  The patient thanked the CSW for assistance.  Patient/Family's Understanding of and Emotional Response to Diagnosis,  Current Treatment, and Prognosis:  The patient understands the level of care needs and is in agreement with the current discharge plan to SNF.  Emotional Assessment Appearance:  Appears younger than stated age Attitude/Demeanor/Rapport:  Apprehensive (Pleasant) Affect (typically observed):  Apprehensive, Appropriate, Pleasant Orientation:  Oriented to Self, Oriented to Place, Oriented to  Time, Oriented to Situation Alcohol / Substance use:  Never Used Psych involvement (Current and /or in the community):  No (Comment)  Discharge Needs  Concerns to be addressed:  Care Coordination, Discharge Planning Concerns Readmission within the last 30 days:  No Current discharge risk:  None Barriers to Discharge:  Continued Medical Work up   [employer], LCSW 05/10/2017, 3:41 PM

## 2017-05-10 NOTE — Progress Notes (Addendum)
Reports feeling much better with no fever with minimal right knee pain with flexion. Continued bilateral foot/ankle edema. Cardiac ECHO done. Neuro consult completed. Additional labs being ordered. WBC down to 2.0, creatinine improved. Neuro checks stable and in normal limits-discontinued. IVF's discontinued with pt diet advanced to carb modified which he is tolerating well. Anticipate discharge tomorrow or next day. Muscle weakness/multiple joint pain resolved. Bilateral tremors of hands/arms much improved.

## 2017-05-10 NOTE — Clinical Social Work Placement (Signed)
   CLINICAL SOCIAL WORK PLACEMENT  NOTE  Date:  05/10/2017  Patient Details  Name: Troy Shepherd MRN: [id number] Date of Birth: 06/24/1946  Clinical Social Work is seeking post-discharge placement for this patient at the [place] level of care (*CSW will initial, date and re-position this form in  chart as items are completed):  Yes   Patient/family provided with [place] Work Department's list of facilities offering this level of care within the geographic area requested by the patient (or if unable, by the patient's family).  Yes   Patient/family informed of their freedom to choose among providers that offer the needed level of care, that participate in Medicare, Medicaid or managed care program needed by the patient, have an available bed and are willing to accept the patient.  Yes   Patient/family informed of 's ownership interest in [facility] and [facility], as well as of the fact that they are under no obligation to receive care at these facilities.  PASRR submitted to EDS on       PASRR number received on       Existing PASRR number confirmed on 05/10/17     FL2 transmitted to all facilities in geographic area requested by pt/family on 05/10/17     FL2 transmitted to all facilities within larger geographic area on       Patient informed that his/her managed care company has contracts with or will negotiate with certain facilities, including the following:            Patient/family informed of bed offers received.  Patient chooses bed at       Physician recommends and patient chooses bed at      Patient to be transferred to   on  .  Patient to be transferred to facility by       Patient family notified on   of transfer.  Name of family member notified:        PHYSICIAN       Additional Comment:    _______________________________________________ Zettie Pho, LCSW 05/10/2017, 3:44 PM

## 2017-05-11 ENCOUNTER — Telehealth: Payer: Self-pay | Admitting: *Deleted

## 2017-05-11 ENCOUNTER — Observation Stay: Payer: Medicare PPO

## 2017-05-11 LAB — CBC WITH DIFFERENTIAL/PLATELET
BASOS ABS: 0 10*3/uL (ref 0–0.1)
Basophils Relative: 0 %
EOS PCT: 0 %
Eosinophils Absolute: 0 10*3/uL (ref 0–0.7)
HCT: 28.1 % — ABNORMAL LOW (ref 40.0–52.0)
Hemoglobin: 9.8 g/dL — ABNORMAL LOW (ref 13.0–18.0)
LYMPHS ABS: 0.6 10*3/uL — AB (ref 1.0–3.6)
Lymphocytes Relative: 7 %
MCH: 35 pg — ABNORMAL HIGH (ref 26.0–34.0)
MCHC: 34.9 g/dL (ref 32.0–36.0)
MCV: 100.4 fL — ABNORMAL HIGH (ref 80.0–100.0)
Monocytes Absolute: 0.4 10*3/uL (ref 0.2–1.0)
Monocytes Relative: 4 %
Neutro Abs: 8.2 10*3/uL — ABNORMAL HIGH (ref 1.4–6.5)
Neutrophils Relative %: 89 %
Platelets: 420 10*3/uL (ref 150–440)
RBC: 2.79 MIL/uL — AB (ref 4.40–5.90)
RDW: 15.9 % — AB (ref 11.5–14.5)
WBC: 9.3 10*3/uL (ref 3.8–10.6)

## 2017-05-11 LAB — BODY FLUID CULTURE: Culture: NO GROWTH

## 2017-05-11 LAB — RAPID HIV SCREEN (HIV 1/2 AB+AG)
HIV 1/2 Antibodies: NONREACTIVE
HIV-1 P24 ANTIGEN - HIV24: NONREACTIVE

## 2017-05-11 LAB — ECHOCARDIOGRAM COMPLETE
Height: 67 in
Weight: 2075.2 oz

## 2017-05-11 LAB — URIC ACID: URIC ACID, SERUM: 5.6 mg/dL (ref 4.4–7.6)

## 2017-05-11 LAB — GLUCOSE, CAPILLARY: Glucose-Capillary: 110 mg/dL — ABNORMAL HIGH (ref 65–99)

## 2017-05-11 MED ORDER — PANTOPRAZOLE SODIUM 40 MG PO TBEC: 40.0000 mg | DELAYED_RELEASE_TABLET | Freq: Every day | ORAL | 0 refills | Status: DC

## 2017-05-11 MED ORDER — DOXYCYCLINE MONOHYDRATE 100 MG PO TABS: 100.0000 mg | ORAL_TABLET | Freq: Two times a day (BID) | ORAL | 0 refills | Status: AC

## 2017-05-11 MED ORDER — PREDNISONE 10 MG (21) PO TBPK: ORAL_TABLET | ORAL | 0 refills | Status: DC

## 2017-05-11 NOTE — Consult Note (Signed)
Reason for Consult :arthritis  Referring Physician: Hospitalist  Troy Shepherd   HPI: 71 year old African-American male. Works part-time as Sports coach. Past history of hypertension. Had recent evaluation for macrocytic anemia. Drinks alcohol on  A regular basis For the last several months he's had episodic joint pain. His been emergency room on several occasions as well as acute care. We'll have pain in left shoulder which will come and go. We'll have pain in right elbow and it hurt when he extends it. Occasionally would have pain and swelling in the saline. Some pain in the right knee. Was seen in the emergency room recently. Thought to be osteoarthritis. Given tramadol. One week ago had tick bite. Pulled out of the groin. No erythema. After that right foot swelled. Almost had a limp on it. He's never had podagra. Then had more swelling and pain in the right knee. He was seen in the emergency room. Arthrocentesis showed 900 white cells. Negative crystals. Negative culture. After that felt more pain in his knee and his foot was admitted. He was given 120 mg of IV Solu-Medrol. Was seen by neurology and physical therapy with no deficits noted. Joint pain is resolved. Had low-grade fever initially. Did have mild neutropenia but his had prior neutropenia in the past. Recent albumin low at 2.7. Hemoglobin 9.3. Normal creatinine at 1.1. Sedimentation rate 106. Elevated immunoglobulins.  PMH: Diverticulosis. Colon polyp. Neutropenia. Macrocytic anemia. Hypertension. Kidney cyst. Prior evaluation for hilar adenopathy and pulmonary nodule. Had abnormal PET scan with uptake of hilar nodes and mediastinal nodes nodule is negative. No recent follow-up of that  SURGICAL HISTORY: Colon polyp removal. No joint surgery.  Family History: Negative for gout or rheumatoid arthritis  Social History: Smokes. Occasional alcohol  Allergies: No Known Allergies  Medications:  Scheduled: . amLODipine  5 mg Oral Daily  .  aspirin EC  81 mg Oral Daily  . B-complex with vitamin C  1 tablet Oral Daily  . docusate sodium  100 mg Oral BID  . doxycycline  100 mg Oral Q12H  . folic acid  1 mg Oral Daily  . heparin  5,000 Units Subcutaneous Q8H  . lisinopril  10 mg Oral Daily  . methylPREDNISolone (SOLU-MEDROL) injection  60 mg Intravenous Q12H  . pantoprazole  40 mg Oral Daily  . sodium chloride flush  3 mL Intravenous Q12H        ROS:No photosensitivity. No Raynaud's. No history of pleurisy. No history of podagra. No weight change. No chills. No GI upset. No blood per rectum.   PHYSICAL EXAM: Blood pressure (!) 113/54, pulse (!) 56, temperature 97.8 F (36.6 C), temperature source Oral, resp. rate 19, height '5\' 7"'$  (1.702 m), weight 59.6 kg (131 lb 4.8 oz), SpO2 97 %. Pleasant male. No skin rashes. No psoriasis. Sitting on bedside chair. No distress. No rash. No discoid lesions. Sclera clear. Clear oropharynx. No thyromegaly. Good carotid upstroke. Distant lung sounds. No significant murmur. No visceromegaly. No significant edema Good range of motion neck and shoulders. Elbows with mild thickening of the right elbow. No definite olecranon bursa swelling. No nodules. Wrists MCPs PIPs without synovitis or tophi. Hips move well. Small amount of thickening of the right knee compared to the left. Good range of motion of his ankles. MTPs nontender. No dactylitis.  Assessment: Recent onset of swelling and right knee and dorsum of the foot preceded by several months of joint pain oligoarticular. Usually resolves without significant intervention. Cannot rule out crystalline disease or early onset  of inflammatory arthritis. He has no synovitis on exam today but is on 120 mg of IV steroids. Prior sedimentation rate CRP were elevated indicating inflammatory arthritis  Recent tick bite. Not sure that it correlates with his ongoing arthritic symptoms.  Macrocytic anemia. Unclear etiology. Polyclonal gammopathy Low  albumin. History of mediastinal and hilar adenopathy. Question reactive versus sarcoid versus malignancy Cigarettes Some alcohol  Recommendations: Acute symptoms have resolved. Would switch him over to oral prednisone and taper off over the next week.  I ordered rheumatoid factor, uric acid, Ace level, Lyme antibody and chest x-ray   Troy Shepherd 05/11/2017, 12:46 PM

## 2017-05-11 NOTE — Care Management Note (Signed)
Case Management Note  Patient Details  Name: Troy Shepherd MRN: [id number] Date of Birth: 14-May-1946  Subjective/Objective:                   Admitted to this facility with the diagnosis of acute viral syndrome under observation status. Lives with wife, Lorenso Courier [phone]). Last seen Dr. Gwynneth Aliment a couple of weeks ago. Prescriptions are filled at Dr. Andree Elk office. Home Health many years ago, doesn't remember name of agency. No skilled facility. No home oxygen. No equipment in the home. Works part time Education administrator at [employer]. [employer] care of all basic and instrumental activities of daily living himself, drives, but car is in the shop. Wife will get a friend to transport. No falls. Fair-good appetite  Action/Plan:  Physical therapy evaluation completed. No follow-up physical therapy needs.    Expected Discharge Date:  05/12/17               Expected Discharge Plan:     In-House Referral:   yes  Discharge planning Services   none recommended  Post Acute Care Choice:    Choice offered to:     DME Arranged:   none recommended DME Agency:     HH Arranged:   none recommended [place]:     Status of Service:     If discussed at [place] of Stay Meetings, dates discussed:    Additional Comments:  No follow-up needs  Shelbie Ammons, RN MSN CCM Care Management [phone] 05/11/2017, 11:27 AM

## 2017-05-11 NOTE — Clinical Social Work Note (Signed)
Pt is ready for discharge today and will return home. PT's recommendation has changed to no PT follow up. RNCM following for discharge planning needs. CSW is signing off as no further needs identified.   Darden Dates, MSW, LCSW  Clinical Social Worker  [phone]

## 2017-05-11 NOTE — Discharge Summary (Addendum)
[facility] NAME: Troy Shepherd    MR#:  [id number]  DATE OF BIRTH:  1946-06-04  DATE OF ADMISSION:  05/09/2017 ADMITTING PHYSICIAN: Idelle Crouch, MD  DATE OF DISCHARGE: 05/11/2017  PRIMARY CARE PHYSICIAN: Herminio Commons, MD    ADMISSION DIAGNOSIS:  Dehydration [E86.0] Weakness [R53.1] Leg swelling [M79.89]  DISCHARGE DIAGNOSIS:  Principal Problem:   Acute viral syndrome Active Problems:   Tick bite of groin   Generalized weakness   Ambulatory dysfunction  SECONDARY DIAGNOSIS:   Past Medical History:  Diagnosis Date  . Alcohol abuse    quit 09/2014  . Arthritis   . Hypertension   . Leukopenia    resolved when stopped drinking 09/2014    HOSPITAL COURSE:   * Tick borne illness, arthritis On Doxycycline Sent Antibodies for lyme disease Consulted ID- suggested 10 days of oral doxy. Elevated CRP, had some LN pathy in past too. 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   Ordered work ups, and advised to give oral tapering steroids on d/c.  * Leucopenia likely from acute illness  * HTN Home meds  * Generalized weakness Due to acute illness. Consult physical therapy. Slowly improving.   Baseline now, after Iv sterids.  * Right knee arthritis. Had arthrocentesis. Septic arthritis ruled out.  DISCHARGE CONDITIONS:   Stable.  CONSULTS OBTAINED:  Treatment Team:  Alexis Goodell, MD [name]., MD Leonel Ramsay, MD  DRUG ALLERGIES:  No Known Allergies  DISCHARGE MEDICATIONS:   Current Discharge Medication List    START taking these medications   Details  doxycycline (ADOXA) 100 MG tablet Take 1 tablet (100 mg total) by mouth 2 (two) times daily. Qty: 20 tablet, Refills: 0    pantoprazole (PROTONIX) 40 MG tablet Take 1 tablet (40 mg total) by mouth daily. Qty: 15 tablet, Refills: 0    predniSONE (STERAPRED UNI-PAK 21 TAB) 10 MG (21)  TBPK tablet Take 6 tabs first day, 5 tab on day 2, then 4 on day 3rd, 3 tabs on day 4th , 2 tab on day 5th, and 1 tab on 6th day. Qty: 21 tablet, Refills: 0      CONTINUE these medications which have NOT CHANGED   Details  amLODipine (NORVASC) 5 MG tablet Take by mouth daily.     B Complex Vitamins (VITAMIN B COMPLEX PO) Take by mouth.    folic acid (FOLVITE) 0.5 MG tablet Take by mouth.    ondansetron (ZOFRAN ODT) 4 MG disintegrating tablet Take 1 tablet (4 mg total) by mouth every 8 (eight) hours as needed for nausea or vomiting. Qty: 20 tablet, Refills: 0    traMADol (ULTRAM) 50 MG tablet Take 1 tablet (50 mg total) by mouth every 6 (six) hours as needed. Qty: 20 tablet, Refills: 0      STOP taking these medications     lisinopril (PRINIVIL,ZESTRIL) 10 MG tablet      predniSONE (DELTASONE) 10 MG tablet          DISCHARGE INSTRUCTIONS:    Follow with rheumatology clinic.  If you experience worsening of your admission symptoms, develop shortness of breath, life threatening emergency, suicidal or homicidal thoughts you must seek medical attention immediately by calling 911 or calling your MD immediately  if symptoms less severe.  You Must read complete instructions/literature along with all the possible adverse reactions/side effects for all the Medicines you take and that have been prescribed to you.  Take any new Medicines after you have completely understood and accept all the possible adverse reactions/side effects.   Please note  You were cared for by a hospitalist during your hospital stay. If you have any questions about your discharge medications or the care you received while you were in the hospital after you are discharged, you can call the unit and asked to speak with the hospitalist on call if the hospitalist that took care of you is not available. Once you are discharged, your primary care physician will handle any further medical issues. Please note that NO  REFILLS for any discharge medications will be authorized once you are discharged, as it is imperative that you return to your primary care physician (or establish a relationship with a primary care physician if you do not have one) for your aftercare needs so that they can reassess your need for medications and monitor your lab values.    Today   CHIEF COMPLAINT:   Chief Complaint  Patient presents with  . Weakness  . Leg Swelling  . Emesis    HISTORY OF PRESENT ILLNESS:  Troy Shepherd  is a 71 y.o. male with a known history of HTN and OA with tick bite to groin 1 week ago now with fever, N/V, and weakness. Joints hurt. Cannot walk due to weakness and diffuse pain. Denies CP or SOB. No cough. No neck stiffness. Denies diarrhea. In ER, pt was weak and unable to ambulate. He is now admitted.   VITAL SIGNS:  Blood pressure 115/63, pulse (!) 58, temperature 97.8 F (36.6 C), temperature source Oral, resp. rate 18, height '5\' 7"'$  (1.702 m), weight 59.6 kg (131 lb 4.8 oz), SpO2 100 %.  I/O:    Intake/Output Summary (Last 24 hours) at 05/11/17 1445 Last data filed at 05/11/17 1408  Gross per 24 hour  Intake              480 ml  Output              900 ml  Net             -420 ml    PHYSICAL EXAMINATION:  GENERAL:  71 y.o.-year-old patient lying in the bed with no acute distress.  EYES: Pupils equal, round, reactive to light and accommodation. No scleral icterus. Extraocular muscles intact.  HEENT: Head atraumatic, normocephalic. Oropharynx and nasopharynx clear.  NECK:  Supple, no jugular venous distention. No thyroid enlargement, no tenderness.  LUNGS: Normal breath sounds bilaterally, no wheezing, rales,rhonchi or crepitation. No use of accessory muscles of respiration.  CARDIOVASCULAR: S1, S2 normal. No murmurs, rubs, or gallops.  ABDOMEN: Soft, non-tender, non-distended. Bowel sounds present. No organomegaly or mass.  EXTREMITIES: No pedal edema, cyanosis, or clubbing.   NEUROLOGIC: Cranial nerves II through XII are intact. Muscle strength 5/5 in all extremities. Sensation intact. Gait not checked.  PSYCHIATRIC: The patient is alert and oriented x 3.  SKIN: No obvious rash, lesion, or ulcer.   DATA REVIEW:   CBC  Recent Labs Lab 05/11/17 0520  WBC 9.3  HGB 9.8*  HCT 28.1*  PLT 420    Chemistries   Recent Labs Lab 05/10/17 0421  NA 133*  K 4.3  CL 104  CO2 21*  GLUCOSE 151*  BUN 28*  CREATININE 1.13  CALCIUM 9.0  AST 15  ALT 8*  ALKPHOS 39  BILITOT 0.5    Cardiac Enzymes  Recent Labs Lab 05/07/17 1219  TROPONINI <0.03  Microbiology Results  Results for orders placed or performed during the hospital encounter of 05/09/17  Urine culture     Status: None   Collection Time: 05/09/17 11:33 AM  Result Value Ref Range Status   Specimen Description URINE, RANDOM  Final   Special Requests NONE  Final   Culture   Final    NO GROWTH Performed at [facility], [place] [address] [place], [place] [address]    Report Status 05/10/2017 FINAL  Final    RADIOLOGY:  Dg Chest 2 View  Result Date: 05/11/2017 CLINICAL DATA:  Fever after tick bite.  Joint pain and stiffness. EXAM: CHEST  2 VIEW COMPARISON:  05/09/2017 and 01/06/2016 FINDINGS: The heart size and mediastinal contours are within normal limits. There are tiny bilateral pleural effusions. The lungs are clear. No acute bone abnormality. IMPRESSION: Tiny bilateral pleural effusions, new.  Otherwise, normal exam. Electronically Signed   By: Lorriane Shire M.D.   On: 05/11/2017 14:21    EKG:   Orders placed or performed during the hospital encounter of 05/09/17  . EKG 12-Lead  . EKG 12-Lead      Management plans discussed with the patient, family and they are in agreement.  CODE STATUS:     Code Status Orders        Start     Ordered   05/09/17 1456  Full code  Continuous     05/09/17 1455    Code Status History    Date Active Date Inactive Code Status  Order ID Comments User Context   01/06/2016 11:30 PM 01/07/2016  7:18 PM Full Code [id number]  Lance Coon, MD Inpatient      TOTAL TIME TAKING CARE OF THIS PATIENT: 35 minutes.    Vaughan Basta M.D on 05/11/2017 at 2:45 PM  Between 7am to 6pm - Pager - [phone]  After 6pm go to [email or web address] - password [email or web address] [facility]  Office  [phone]  CC: Primary care physician; Herminio Commons, MD   Note: This dictation was prepared with Dragon dictation along with smaller phrase technology. Any transcriptional errors that result from this process are unintentional.

## 2017-05-11 NOTE — Consult Note (Signed)
Troy Shepherd     Reason for Consult: Tick bite, arthritis     Referring Physician: Dolores Frame Date of Admission:  05/09/2017   Principal Problem:   Acute viral syndrome Active Problems:   Tick bite of groin   Generalized weakness   Ambulatory dysfunction   HPI: Troy Shepherd is a 71 y.o. male admitted with complaints of tick bite on week prior, then NV fever and weakness. He also had joint pain and stiffness off and on for several months.  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 Lives with wife.  No prior F/c/ns/wt loss.    Past Medical History:  Diagnosis Date  . Alcohol abuse    quit 09/2014  . Arthritis   . Hypertension   . Leukopenia    resolved when stopped drinking 09/2014   Past Surgical History:  Procedure Laterality Date  . APPENDECTOMY    . COLONOSCOPY N/A 11/09/2015   Procedure: COLONOSCOPY;  Surgeon: Josefine Class, MD;  Location: [facility]COPY;  Service: Endoscopy;  Laterality: N/A;   Social History  Substance Use Topics  . Smoking status: Current Every Day Smoker    Packs/day: 0.10    Years: 40.00    Types: Cigarettes  . Smokeless tobacco: Never Used  . Alcohol use 1.8 oz/week    3 Shots of liquor per week     Comment: per pt drinks twice/d 5 days a wk   Family History  Problem Relation Age of Onset  . COPD Sister     Allergies: No Known Allergies  Current antibiotics: Antibiotics Given (last 72 hours)    Date/Time Action Medication Dose   05/09/17 1410 Given   doxycycline (VIBRA-TABS) tablet 100 mg 100 mg   05/09/17 2056 Given  [patient preference]   doxycycline (VIBRA-TABS) tablet 100 mg 100 mg   05/10/17 0844 Given   doxycycline (VIBRA-TABS) tablet 100 mg 100 mg   05/10/17 2016 Given  [patient preference]   doxycycline (VIBRA-TABS) tablet 100 mg 100 mg   05/11/17 1052 Given   doxycycline (VIBRA-TABS) tablet 100 mg 100 mg      MEDICATIONS: . amLODipine  5 mg Oral Daily  . aspirin EC  81 mg Oral Daily  . B-complex  with vitamin C  1 tablet Oral Daily  . docusate sodium  100 mg Oral BID  . doxycycline  100 mg Oral Q12H  . folic acid  1 mg Oral Daily  . heparin  5,000 Units Subcutaneous Q8H  . lisinopril  10 mg Oral Daily  . methylPREDNISolone (SOLU-MEDROL) injection  60 mg Intravenous Q12H  . pantoprazole  40 mg Oral Daily  . sodium chloride flush  3 mL Intravenous Q12H    Review of Systems - 11 systems reviewed and negative per HPI   OBJECTIVE: Temp:  [97.8 F (36.6 C)-98.1 F (36.7 C)] 97.8 F (36.6 C) (05/14 0352) Pulse Rate:  [44-71] 58 (05/14 1316) Resp:  [18-20] 18 (05/14 1302) BP: (87-115)/(37-64) 115/63 (05/14 1302) SpO2:  [97 %-100 %] 100 % (05/14 1316) Weight:  [59.6 kg (131 lb 4.8 oz)] 59.6 kg (131 lb 4.8 oz) (05/14 0500) Physical Exam  Constitutional: He is oriented to person, place, and time. He appears thin, a little disheveled HENT: anicteric, muddy sclear Mouth/Throat: Oropharynx is clear and dry. No oropharyngeal exudate.  Cardiovascular: Normal rate, regular rhythm and normal heart sounds.  Pulmonary/Chest: Effort normal and breath sounds normal. No respiratory distress. He has no wheezes.  Abdominal: Soft. Bowel sounds are normal.  He exhibits no distension. There is no tenderness.  Lymphadenopathy: He has no cervical adenopathy.  Neurological: He is alert and oriented to person, place, and time.  Skin: Skin is warm and dry. No rash noted. No erythema.  Psychiatric: He has a normal mood and affect. His behavior is normal.   LABS: Results for orders placed or performed during the hospital encounter of 05/09/17 (from the past 48 hour(s))  Comprehensive metabolic panel     Status: Abnormal   Collection Time: 05/10/17  4:21 AM  Result Value Ref Range   Sodium 133 (L) 135 - 145 mmol/L   Potassium 4.3 3.5 - 5.1 mmol/L   Chloride 104 101 - 111 mmol/L   CO2 21 (L) 22 - 32 mmol/L   Glucose, Bld 151 (H) 65 - 99 mg/dL   BUN 28 (H) 6 - 20 mg/dL   Creatinine, Ser 1.13 0.61 -  1.24 mg/dL   Calcium 9.0 8.9 - 10.3 mg/dL   Total Protein 6.9 6.5 - 8.1 g/dL   Albumin 2.7 (L) 3.5 - 5.0 g/dL   AST 15 15 - 41 U/L   ALT 8 (L) 17 - 63 U/L   Alkaline Phosphatase 39 38 - 126 U/L   Total Bilirubin 0.5 0.3 - 1.2 mg/dL   GFR calc non Af Amer >60 >60 mL/min   GFR calc Af Amer >60 >60 mL/min    Comment: (NOTE) The eGFR has been calculated using the CKD EPI equation. This calculation has not been validated in all clinical situations. eGFR's persistently <60 mL/min signify possible Chronic Kidney Shepherd.    Anion gap 8 5 - 15  CBC     Status: Abnormal   Collection Time: 05/10/17  4:21 AM  Result Value Ref Range   WBC 2.0 (L) 3.8 - 10.6 K/uL   RBC 2.91 (L) 4.40 - 5.90 MIL/uL   Hemoglobin 10.1 (L) 13.0 - 18.0 g/dL   HCT 29.2 (L) 40.0 - 52.0 %   MCV 100.1 (H) 80.0 - 100.0 fL   MCH 34.7 (H) 26.0 - 34.0 pg   MCHC 34.7 32.0 - 36.0 g/dL   RDW 16.0 (H) 11.5 - 14.5 %   Platelets 404 150 - 440 K/uL  Glucose, capillary     Status: Abnormal   Collection Time: 05/10/17  7:40 AM  Result Value Ref Range   Glucose-Capillary 177 (H) 65 - 99 mg/dL  Vitamin B12     Status: None   Collection Time: 05/10/17 12:21 PM  Result Value Ref Range   Vitamin B-12 217 180 - 914 pg/mL    Comment: (NOTE) This assay is not validated for testing neonatal or myeloproliferative syndrome specimens for Vitamin B12 levels. Performed at [facility], [place] [address] [place], [place] [address]   Folate     Status: None   Collection Time: 05/10/17 12:21 PM  Result Value Ref Range   Folate 25.0 >5.9 ng/mL  CBC with Differential/Platelet     Status: Abnormal   Collection Time: 05/11/17  5:20 AM  Result Value Ref Range   WBC 9.3 3.8 - 10.6 K/uL   RBC 2.79 (L) 4.40 - 5.90 MIL/uL   Hemoglobin 9.8 (L) 13.0 - 18.0 g/dL   HCT 28.1 (L) 40.0 - 52.0 %   MCV 100.4 (H) 80.0 - 100.0 fL   MCH 35.0 (H) 26.0 - 34.0 pg   MCHC 34.9 32.0 - 36.0 g/dL   RDW 15.9 (H) 11.5 - 14.5 %   Platelets 420  150 - 440  K/uL   Neutrophils Relative % 89 %   Neutro Abs 8.2 (H) 1.4 - 6.5 K/uL   Lymphocytes Relative 7 %   Lymphs Abs 0.6 (L) 1.0 - 3.6 K/uL   Monocytes Relative 4 %   Monocytes Absolute 0.4 0.2 - 1.0 K/uL   Eosinophils Relative 0 %   Eosinophils Absolute 0.0 0 - 0.7 K/uL   Basophils Relative 0 %   Basophils Absolute 0.0 0 - 0.1 K/uL  Glucose, capillary     Status: Abnormal   Collection Time: 05/11/17  7:38 AM  Result Value Ref Range   Glucose-Capillary 110 (H) 65 - 99 mg/dL   No components found for: ESR, C REACTIVE PROTEIN MICRO: Recent Results (from the past 720 hour(s))  Body fluid culture     Status: None   Collection Time: 05/07/17  4:54 PM  Result Value Ref Range Status   Specimen Description KNEE  Final   Special Requests NONE  Final   Gram Stain   Final    FEW WBC PRESENT,BOTH PMN AND MONONUCLEAR NO ORGANISMS SEEN    Culture   Final    NO GROWTH 3 DAYS Performed at [facility], [place] [address], [place], [place] [address]    Report Status 05/11/2017 FINAL  Final  Chlamydia/NGC rt PCR ([facility] only)     Status: None   Collection Time: 05/07/17  5:30 PM  Result Value Ref Range Status   Specimen source GC/Chlam URINE, RANDOM  Final   Chlamydia Tr NOT DETECTED NOT DETECTED Final   N gonorrhoeae NOT DETECTED NOT DETECTED Final    Comment: (NOTE) 100  This methodology has not been evaluated in pregnant women or in 200  patients with a history of hysterectomy. 300 400  This methodology will not be performed on patients less than 86  years of age.   Urine culture     Status: None   Collection Time: 05/09/17 11:33 AM  Result Value Ref Range Status   Specimen Description URINE, RANDOM  Final   Special Requests NONE  Final   Culture   Final    NO GROWTH Performed at [facility], [address], [place], [place] [address]    Report Status 05/10/2017 FINAL  Final    IMAGING: Dg Chest 1 View  Result Date: 05/09/2017 CLINICAL DATA:  Severe weakness.  Fever.  EXAM: CHEST 1 VIEW COMPARISON:  January 06, 2016 FINDINGS: The heart size and mediastinal contours are within normal limits. Both lungs are clear. The visualized skeletal structures are unremarkable. IMPRESSION: No active Shepherd. Electronically Signed   By: Dorise Bullion III M.D   On: 05/09/2017 11:37   Dg Knee Complete 4 Views Right  Result Date: 05/07/2017 CLINICAL DATA:  Views RIGHT knee pain for 3-4 days, worse with weight-bearing or moving EXAM: RIGHT KNEE - COMPLETE 4+ VIEW COMPARISON:  None FINDINGS: Osseous demineralization. Joint spaces preserved. No acute fracture, dislocation, or bone destruction. Question mild anterior infrapatellar soft tissue swelling. No knee joint effusion. Scattered atherosclerotic calcifications. IMPRESSION: No acute osseous abnormalities. Electronically Signed   By: Lavonia Dana M.D.   On: 05/07/2017 15:44    Assessment:   Eyden Dobie is a 71 y.o. male admitted with fevers and weakness following a tick bite in setting of prior recurrent arthritis symptoms. He has macrocytic anemia and elevated ESR.  UA neg, cxr neg.  Lyme and RMSF serology pending. 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 With the difficulty arising out of chair which he reported, could have PMR?  He has no travel to Lyme endemic areas so unlikely Lyme. Would however treat with doxy for tick borne illnesses given recent tick bit.e  Recommendations Treat with doxy for 10 days  - we discussed this with him and advised re side effects and photosensitivity. Continue steroids per Dr Jefm Bryant. Fu Rheum - no need to fu with me unless Dr Jefm Bryant requests. Check HIV Hep C as well Thank you very much for allowing me to participate in the care of this patient. Please call with questions.   Cheral Marker. Ola Spurr, MD

## 2017-05-11 NOTE — Progress Notes (Signed)
[place], [place].   05/11/2017  Patient: Troy Shepherd   Date of Birth:  06/05/1946  Date of admission:  05/09/2017  Date of Discharge  05/11/2017    To Whom it May Concern:   Troy Shepherd  may return to work on 05/12/17.  PHYSICAL ACTIVITY:  Full  If you have any questions or concerns, please don't hesitate to call.  Sincerely,   Vaughan Basta M.D Pager [phone] Office : [phone]   .

## 2017-05-11 NOTE — Progress Notes (Signed)
Pt is d/ced home.  Will be on doxycycline for 10 days and prednisone taper.  Will f/u w/PCP.  Lab work pending - Lyme and rheumatoid serology. HIV non-reactive. Chest xray negative.  Pt worked with PT again today and determined he's able to go home instead of SNF. BP and HR have been low today.  BP meds held and his lisinopril has been d/ced.  IV removed.  D/c instructions and f/u appts reviewed and pt will go home with a friend.  Lives at home with his wife.

## 2017-05-11 NOTE — Progress Notes (Signed)
Physical Therapy Treatment Patient Details Name: Troy Shepherd MRN: [id number] DOB: Nov 27, 1946 Today's Date: 05/11/2017    History of Present Illness Patient is a 71 y/o male with recent onset of R knee pain. He was taken to ED after a tick bite last week and fluid was taken from medial side of R knee. Unable to find results of that sample. X-rays are negative for fracture or abnormality. Patient is a very difficult historian and unable to provide much insight. He still works part time. Recent onset of swelling in feet and ankles.     PT Comments    Pt has no voiced complaints. Pt independent in all mobility; supervision with ambulation/stair climbing for assessment purposes, but no deficits noted for ambulation and only mild weakness with stair climbing, which is noted to be pt's baseline subjectively. Suggested step to pattern for optimal safety. Pt voices no foreseen concerns going home. Pt has met all PT goals with no needs seen for PT follow up at this time. Discussed with nursing and CM. Complete PT needs.    Follow Up Recommendations  No PT follow up     Equipment Recommendations       Recommendations for Other Services       Precautions / Restrictions Precautions Precautions: Fall Restrictions Weight Bearing Restrictions: No    Mobility  Bed Mobility Overal bed mobility: Independent             General bed mobility comments: No issues  Transfers Overall transfer level: Independent Equipment used: None             General transfer comment: No safey or balance concerns noted  Ambulation/Gait Ambulation/Gait assistance: Supervision Ambulation Distance (Feet): 370 Feet Assistive device: None Gait Pattern/deviations: WFL(Within Functional Limits);Drifts right/left (Mild drift)   Gait velocity interpretation: at or above normal speed for age/gender (subjectively baseline) General Gait Details: Mild drift, no LOB; subjectively at baseline   Stairs Stairs:  Yes   Stair Management: One rail Right;Alternating pattern Number of Stairs: 6 General stair comments: Mild weakness noted on stair climb/descend, with mild pulling on rail for ascent and decreased eccentric control with descent, but no buckling/LOB; suggested step to pattern for optimal safety.   Wheelchair Mobility    Modified Rankin (Stroke Patients Only)       Balance Overall balance assessment: Independent                                          Cognition Arousal/Alertness: Awake/alert Behavior During Therapy: WFL for tasks assessed/performed Overall Cognitive Status: Within Functional Limits for tasks assessed                                 General Comments: Conversational, laughs       Exercises      General Comments        Pertinent Vitals/Pain Pain Assessment: No/denies pain    Home Living                      Prior Function            PT Goals (current goals can now be found in the care plan section) Progress towards PT goals: Goals met/education completed, patient discharged from PT    Frequency    Min 2X/week  PT Plan Discharge plan needs to be updated    Co-evaluation              AM-PAC PT "6 Clicks" Daily Activity  Outcome Measure  Difficulty turning over in bed (including adjusting bedclothes, sheets and blankets)?: None Difficulty moving from lying on back to sitting on the side of the bed? : None Difficulty sitting down on and standing up from a chair with arms (e.g., wheelchair, bedside commode, etc,.)?: None Help needed moving to and from a bed to chair (including a wheelchair)?: None Help needed walking in hospital room?: None Help needed climbing 3-5 steps with a railing? : None 6 Click Score: 24    End of Session Equipment Utilized During Treatment: Gait belt Activity Tolerance: Patient tolerated treatment well Patient left: in chair;with call bell/phone within  reach Nurse Communication: Mobility status;Other (comment) (discharge recommendations; SW also) PT Visit Diagnosis: Unsteadiness on feet (R26.81);Pain     Time: 1121-6244 PT Time Calculation (min) (ACUTE ONLY): 20 min  Charges:  $Gait Training: 8-22 mins                    G Codes:        Larae Grooms, PTA 05/11/2017, 10:44 AM

## 2017-05-12 ENCOUNTER — Inpatient Hospital Stay: Payer: Medicare PPO | Attending: Oncology | Admitting: Oncology

## 2017-05-12 ENCOUNTER — Other Ambulatory Visit: Payer: Self-pay | Admitting: *Deleted

## 2017-05-12 DIAGNOSIS — D509 Iron deficiency anemia, unspecified: Secondary | ICD-10-CM

## 2017-05-12 LAB — ROCKY MTN SPOTTED FVR ABS PNL(IGG+IGM)
RMSF IGG: NEGATIVE
RMSF IGM: 0.36 {index} (ref 0.00–0.89)

## 2017-05-12 LAB — LYME DISEASE DNA BY PCR(BORRELIA BURG): LYME DISEASE(B. BURGDORFERI) PCR: NEGATIVE

## 2017-05-12 LAB — ANGIOTENSIN CONVERTING ENZYME

## 2017-05-12 LAB — HEPATITIS C ANTIBODY: HCV Ab: <0.1 s/co ratio (ref 0.0–0.9)

## 2017-05-12 LAB — B. BURGDORFI ANTIBODIES: B burgdorferi Ab IgG+IgM: <0.91 {ISR} (ref 0.00–0.90)

## 2017-05-12 LAB — RHEUMATOID FACTOR: RHEUMATOID FACTOR: 192.2 [IU]/mL — AB (ref 0.0–13.9)

## 2017-05-21 ENCOUNTER — Inpatient Hospital Stay: Payer: Medicare PPO | Admitting: Oncology

## 2017-05-21 ENCOUNTER — Encounter: Payer: Self-pay | Admitting: *Deleted

## 2017-05-26 ENCOUNTER — Other Ambulatory Visit: Payer: Self-pay | Admitting: Internal Medicine

## 2017-05-26 DIAGNOSIS — R911 Solitary pulmonary nodule: Secondary | ICD-10-CM

## 2017-05-26 DIAGNOSIS — R590 Localized enlarged lymph nodes: Secondary | ICD-10-CM

## 2017-06-01 ENCOUNTER — Ambulatory Visit
Admission: RE | Admit: 2017-06-01 | Discharge: 2017-06-01 | Disposition: A | Discharge status: Home / Self Care | Payer: Medicare PPO | Source: Ambulatory Visit | Attending: Internal Medicine | Admitting: Internal Medicine

## 2017-06-01 ENCOUNTER — Ambulatory Visit: Payer: Medicare PPO

## 2017-06-01 DIAGNOSIS — J438 Other emphysema: Secondary | ICD-10-CM | POA: Diagnosis not present

## 2017-06-01 DIAGNOSIS — J432 Centrilobular emphysema: Secondary | ICD-10-CM | POA: Diagnosis not present

## 2017-06-01 DIAGNOSIS — R911 Solitary pulmonary nodule: Secondary | ICD-10-CM | POA: Insufficient documentation

## 2017-06-01 DIAGNOSIS — I700 Atherosclerosis of aorta: Secondary | ICD-10-CM | POA: Insufficient documentation

## 2017-06-01 DIAGNOSIS — I2510 Atherosclerotic heart disease of native coronary artery without angina pectoris: Secondary | ICD-10-CM | POA: Insufficient documentation

## 2017-06-01 DIAGNOSIS — K7689 Other specified diseases of liver: Secondary | ICD-10-CM | POA: Diagnosis not present

## 2017-06-01 DIAGNOSIS — R590 Localized enlarged lymph nodes: Secondary | ICD-10-CM | POA: Diagnosis not present

## 2017-06-02 ENCOUNTER — Emergency Department
Admission: EM | Admit: 2017-06-02 | Discharge: 2017-06-03 | Disposition: A | Discharge status: Home / Self Care | Payer: Medicare PPO | Attending: Emergency Medicine | Admitting: Emergency Medicine

## 2017-06-02 DIAGNOSIS — Z779 Other contact with and (suspected) exposures hazardous to health: Secondary | ICD-10-CM | POA: Insufficient documentation

## 2017-06-02 DIAGNOSIS — E860 Dehydration: Secondary | ICD-10-CM | POA: Diagnosis not present

## 2017-06-02 DIAGNOSIS — I10 Essential (primary) hypertension: Secondary | ICD-10-CM | POA: Insufficient documentation

## 2017-06-02 DIAGNOSIS — Z79899 Other long term (current) drug therapy: Secondary | ICD-10-CM | POA: Diagnosis not present

## 2017-06-02 DIAGNOSIS — M2550 Pain in unspecified joint: Secondary | ICD-10-CM | POA: Diagnosis not present

## 2017-06-02 DIAGNOSIS — F17210 Nicotine dependence, cigarettes, uncomplicated: Secondary | ICD-10-CM | POA: Diagnosis not present

## 2017-06-02 DIAGNOSIS — R531 Weakness: Secondary | ICD-10-CM | POA: Insufficient documentation

## 2017-06-02 LAB — COMPREHENSIVE METABOLIC PANEL
ALBUMIN: 2.8 g/dL — AB (ref 3.5–5.0)
ALK PHOS: 53 U/L (ref 38–126)
ALT: 6 U/L — AB (ref 17–63)
ANION GAP: 11 (ref 5–15)
AST: 14 U/L — ABNORMAL LOW (ref 15–41)
BILIRUBIN TOTAL: 0.8 mg/dL (ref 0.3–1.2)
BUN: 23 mg/dL — ABNORMAL HIGH (ref 6–20)
CALCIUM: 9.4 mg/dL (ref 8.9–10.3)
CO2: 26 mmol/L (ref 22–32)
CREATININE: 1.33 mg/dL — AB (ref 0.61–1.24)
Chloride: 98 mmol/L — ABNORMAL LOW (ref 101–111)
GFR calc non Af Amer: 52 mL/min — ABNORMAL LOW (ref >60)
Glucose, Bld: 92 mg/dL (ref 65–99)
Potassium: 4.4 mmol/L (ref 3.5–5.1)
Sodium: 135 mmol/L (ref 135–145)
TOTAL PROTEIN: 7.4 g/dL (ref 6.5–8.1)

## 2017-06-02 LAB — CBC
HEMATOCRIT: 29.8 % — AB (ref 40.0–52.0)
HEMOGLOBIN: 10.1 g/dL — AB (ref 13.0–18.0)
MCH: 33 pg (ref 26.0–34.0)
MCHC: 34 g/dL (ref 32.0–36.0)
MCV: 97.2 fL (ref 80.0–100.0)
Platelets: 547 10*3/uL — ABNORMAL HIGH (ref 150–440)
RBC: 3.07 MIL/uL — AB (ref 4.40–5.90)
RDW: 16.8 % — ABNORMAL HIGH (ref 11.5–14.5)
WBC: 6.5 10*3/uL (ref 3.8–10.6)

## 2017-06-02 LAB — LACTIC ACID, PLASMA: Lactic Acid, Venous: 1.1 mmol/L (ref 0.5–1.9)

## 2017-06-02 LAB — TROPONIN I: Troponin I: <0.03 ng/mL (ref <0.03)

## 2017-06-02 MED ORDER — SODIUM CHLORIDE 0.9 % IV BOLUS (SEPSIS)
500.0000 mL | Freq: Once | INTRAVENOUS | Status: AC
  Administered 2017-06-02: 500 mL via INTRAVENOUS

## 2017-06-02 MED ORDER — METHYLPREDNISOLONE SODIUM SUCC 125 MG IJ SOLR
125.0000 mg | Freq: Once | INTRAMUSCULAR | Status: AC
  Administered 2017-06-02: 125 mg via INTRAVENOUS
  Filled 2017-06-02: qty 2

## 2017-06-02 MED ORDER — MORPHINE SULFATE (PF) 2 MG/ML IV SOLN
2.0000 mg | Freq: Once | INTRAVENOUS | Status: AC
  Administered 2017-06-02: 2 mg via INTRAVENOUS
  Filled 2017-06-02: qty 1

## 2017-06-02 NOTE — ED Triage Notes (Signed)
Pt in with co generalized body aches states started today but states has been hospitalized recently for ankle edema and has been on antibiotics for a tick bite that he already finished.

## 2017-06-02 NOTE — ED Provider Notes (Signed)
[facility] Emergency Department Provider Note   ____________________________________________   First MD Initiated Contact with Patient 06/02/17 2257     (approximate)  I have reviewed the triage vital signs and the nursing notes.   HISTORY  Chief Complaint Weakness    HPI Troy Shepherd is a 71 y.o. male who presents to the ED from home with a chief complaint of generalized body aches, multiple joint painand generalized weakness. Patient was hospitalized for same 2 weeks ago; discharged home on prednisone and doxycycline for tick bites. Has an upcoming appointment with rheumatology on 6/7; in the hospital patient had rheumatological testing suspicious for rheumatoid arthritis. Denies fever, chills, chest pain, shortness of breath, abdominal pain, nausea, vomiting, diarrhea. Denies recent travel or trauma. Nothing makes his symptoms better or worse. Ambulates with cane at baseline.   Past Medical History:  Diagnosis Date  . Alcohol abuse    quit 09/2014  . Arthritis   . Hypertension   . Leukopenia    resolved when stopped drinking 09/2014    Patient Active Problem List   Diagnosis Date Noted  . Acute viral syndrome 05/09/2017  . Tick bite of groin 05/09/2017  . Generalized weakness 05/09/2017  . Ambulatory dysfunction 05/09/2017  . Alcohol dependence ([place]) 03/05/2017  . Sepsis ([place]) 01/06/2016  . Hypokalemia 01/06/2016  . HTN (hypertension) 01/06/2016  . Nausea and vomiting 01/06/2016  . Dehydration 01/06/2016    Past Surgical History:  Procedure Laterality Date  . APPENDECTOMY    . COLONOSCOPY N/A 11/09/2015   Procedure: COLONOSCOPY;  Surgeon: Josefine Class, MD;  Location: [facility] ENDOSCOPY;  Service: Endoscopy;  Laterality: N/A;    Prior to Admission medications   Medication Sig Start Date End Date Taking? Authorizing Provider  amLODipine (NORVASC) 5 MG tablet Take by mouth daily.    Yes [provider]  lisinopril  (PRINIVIL,ZESTRIL) 10 MG tablet Take 10 mg by mouth daily. 02/14/17 02/14/18 Yes [provider]  methylPREDNISolone (MEDROL DOSEPAK) 4 MG TBPK tablet Take as directed 06/03/17   Paulette Blanch, MD  oxyCODONE-acetaminophen (ROXICET) 5-325 MG tablet Take 1 tablet by mouth every 6 (six) hours as needed for severe pain. 06/03/17   Paulette Blanch, MD    Allergies Patient has no known allergies.  Family History  Problem Relation Age of Onset  . COPD Sister     Social History Social History  Substance Use Topics  . Smoking status: Current Every Day Smoker    Packs/day: 0.10    Years: 40.00    Types: Cigarettes  . Smokeless tobacco: Never Used  . Alcohol use 1.8 oz/week    3 Shots of liquor per week     Comment: per pt drinks twice/d 5 days a wk    Review of Systems  Constitutional: Positive for generalized weakness and body aches. No fever/chills. Eyes: No visual changes. ENT: No sore throat. Cardiovascular: Denies chest pain. Respiratory: Denies shortness of breath. Gastrointestinal: No abdominal pain.  No nausea, no vomiting.  No diarrhea.  No constipation. Genitourinary: Negative for dysuria. Musculoskeletal: Positive for multiple joint pain. Skin: Negative for rash. Neurological: Negative for headaches, focal weakness or numbness.   ____________________________________________   PHYSICAL EXAM:  VITAL SIGNS: ED Triage Vitals  Enc Vitals Group     BP 06/02/17 2213 97/74     Pulse Rate 06/02/17 2213 (!) 112     Resp 06/02/17 2213 20     Temp 06/02/17 2213 99.7 F (37.6 C)  Temp Source 06/02/17 2213 Oral     SpO2 06/02/17 2213 98 %     Weight 06/02/17 2214 131 lb (59.4 kg)     Height --      Head Circumference --      Peak Flow --      Pain Score 06/02/17 2213 7     Pain Loc --      Pain Edu? --      Excl. in [place]? --     Constitutional: Alert and oriented. Chronically-ill appearing and in no acute distress. Eyes: Conjunctivae are normal. PERRL.  EOMI. Head: Atraumatic. Nose: No congestion/rhinnorhea. Mouth/Throat: Mucous membranes are moist.  Oropharynx non-erythematous. Neck: No stridor.  No cervical spine tenderness to palpation.  Supple neck without meningismus. Cardiovascular: Normal rate, regular rhythm. Grossly normal heart sounds.  Good peripheral circulation. Respiratory: Normal respiratory effort.  No retractions. Lungs CTAB. Gastrointestinal: Soft and nontender. No distention. No abdominal bruits. No CVA tenderness. Musculoskeletal: Right hand swelling. Bilateral shoulder, elbow, wrist and knee pain. Right knee with moderate effusion. Right knee without warmth or erythema. Limited range of motion secondary to pain. Neurologic:  Normal speech and language. No gross focal neurologic deficits are appreciated.  Skin:  Skin is warm, dry and intact. No rash noted. No petechiae. Psychiatric: Mood and affect are normal. Speech and behavior are normal.  ____________________________________________   LABS (all labs ordered are listed, but only abnormal results are displayed)  Labs Reviewed  CBC - Abnormal; Notable for the following:       Result Value   RBC 3.07 (*)    Hemoglobin 10.1 (*)    HCT 29.8 (*)    RDW 16.8 (*)    Platelets 547 (*)    All other components within normal limits  COMPREHENSIVE METABOLIC PANEL - Abnormal; Notable for the following:    Chloride 98 (*)    BUN 23 (*)    Creatinine, Ser 1.33 (*)    Albumin 2.8 (*)    AST 14 (*)    ALT 6 (*)    GFR calc non Af Amer 52 (*)    All other components within normal limits  SEDIMENTATION RATE - Abnormal; Notable for the following:    Sed Rate 59 (*)    All other components within normal limits  TROPONIN I  LACTIC ACID, PLASMA  URINALYSIS, COMPLETE (UACMP) WITH MICROSCOPIC  LACTIC ACID, PLASMA  C-REACTIVE PROTEIN   ____________________________________________  EKG  ED ECG REPORT I, SUNG,JADE J, the attending physician, personally viewed and  interpreted this ECG.   Date: 06/02/2017  EKG Time: 2216  Rate: 110  Rhythm: sinus tachycardia  Axis: Normal  Intervals:none  ST&T Change: Nonspecific  ____________________________________________  RADIOLOGY  No results found.  ____________________________________________   PROCEDURES  Procedure(s) performed: None  Procedures  Critical Care performed: No  ____________________________________________   INITIAL IMPRESSION / ASSESSMENT AND PLAN / ED COURSE  Pertinent labs & imaging results that were available during my care of the patient were reviewed by me and considered in my medical decision making (see chart for details).  71 year old male who presents with generalized weakness and multiple joint pain, especially in his right knee. Repeat oral temperature is 99.8F. Slight bump in creatinine from discharge 3 weeks ago. Will check lactate and sedimentation rate. IV fluid hydration, initiate steroid and ambulate patient to determine disposition.  Clinical Course as of Jun 03 532  Wed Jun 03, 2017  0204 Patient ambulated well with steady gait. Sedimentation rate  is half where it was on his prior admission. Updated patient and spouse of normal lactate. Strict return precautions given. Both verbalize understanding and agree with plan of care.  [JS]    Clinical Course User Index [JS] Paulette Blanch, MD     ____________________________________________   FINAL CLINICAL IMPRESSION(S) / ED DIAGNOSES  Final diagnoses:  Generalized weakness  Arthralgia, unspecified joint  Dehydration      NEW MEDICATIONS STARTED DURING THIS VISIT:  Discharge Medication List as of 06/03/2017  2:04 AM    START taking these medications   Details  methylPREDNISolone (MEDROL DOSEPAK) 4 MG TBPK tablet Take as directed, Print    oxyCODONE-acetaminophen (ROXICET) 5-325 MG tablet Take 1 tablet by mouth every 6 (six) hours as needed for severe pain., Starting Wed 06/03/2017, Print          Note:  This document was prepared using Dragon voice recognition software and may include unintentional dictation errors.    Paulette Blanch, MD 06/03/17 [phone]

## 2017-06-03 LAB — SEDIMENTATION RATE: Sed Rate: 59 mm/hr — ABNORMAL HIGH (ref 0–20)

## 2017-06-03 LAB — C-REACTIVE PROTEIN: CRP: 14.2 mg/dL — ABNORMAL HIGH (ref <1.0)

## 2017-06-03 MED ORDER — METHYLPREDNISOLONE 4 MG PO TBPK: ORAL_TABLET | ORAL | 0 refills | Status: DC

## 2017-06-03 MED ORDER — OXYCODONE-ACETAMINOPHEN 5-325 MG PO TABS: 1.0000 | ORAL_TABLET | Freq: Four times a day (QID) | ORAL | 0 refills | Status: DC | PRN

## 2017-06-03 MED ORDER — OXYCODONE-ACETAMINOPHEN 5-325 MG PO TABS
1.0000 | ORAL_TABLET | Freq: Once | ORAL | Status: AC
  Administered 2017-06-03: 1 via ORAL
  Filled 2017-06-03: qty 1

## 2017-06-03 NOTE — ED Notes (Signed)
Pt discharged to home.  Family member driving.  Discharge instructions reviewed.  Verbalized understanding.  No questions or concerns at this time.  Teach back verified.  Pt in NAD.  No items left in ED.   

## 2017-06-03 NOTE — Discharge Instructions (Signed)
1. Start steroid taper as prescribed. 2. You may take Percocet as needed for pain instead of tramadol. 3. Return to the ER for worsened symptoms, persistent vomiting, difficulty breathing or other concerns.

## 2017-06-03 NOTE — ED Notes (Signed)
Pt ambulated well at this time.

## 2017-06-20 ENCOUNTER — Emergency Department
Admission: EM | Admit: 2017-06-20 | Discharge: 2017-06-21 | Disposition: A | Discharge status: Home / Self Care | Payer: Medicare PPO | Attending: Emergency Medicine | Admitting: Emergency Medicine

## 2017-06-20 ENCOUNTER — Encounter: Payer: Self-pay | Admitting: *Deleted

## 2017-06-20 DIAGNOSIS — R52 Pain, unspecified: Secondary | ICD-10-CM

## 2017-06-20 DIAGNOSIS — Z79899 Other long term (current) drug therapy: Secondary | ICD-10-CM | POA: Insufficient documentation

## 2017-06-20 DIAGNOSIS — I10 Essential (primary) hypertension: Secondary | ICD-10-CM | POA: Insufficient documentation

## 2017-06-20 DIAGNOSIS — M059 Rheumatoid arthritis with rheumatoid factor, unspecified: Secondary | ICD-10-CM | POA: Diagnosis not present

## 2017-06-20 DIAGNOSIS — M791 Myalgia: Secondary | ICD-10-CM | POA: Diagnosis not present

## 2017-06-20 DIAGNOSIS — F17210 Nicotine dependence, cigarettes, uncomplicated: Secondary | ICD-10-CM | POA: Insufficient documentation

## 2017-06-20 DIAGNOSIS — M069 Rheumatoid arthritis, unspecified: Secondary | ICD-10-CM

## 2017-06-20 LAB — BASIC METABOLIC PANEL
Anion gap: 11 (ref 5–15)
BUN: 33 mg/dL — AB (ref 6–20)
CALCIUM: 9.2 mg/dL (ref 8.9–10.3)
CHLORIDE: 100 mmol/L — AB (ref 101–111)
CO2: 23 mmol/L (ref 22–32)
Creatinine, Ser: 1.85 mg/dL — ABNORMAL HIGH (ref 0.61–1.24)
GFR calc non Af Amer: 35 mL/min — ABNORMAL LOW (ref >60)
GFR, EST AFRICAN AMERICAN: 41 mL/min — AB (ref >60)
Glucose, Bld: 80 mg/dL (ref 65–99)
Potassium: 4.4 mmol/L (ref 3.5–5.1)
SODIUM: 134 mmol/L — AB (ref 135–145)

## 2017-06-20 LAB — CBC
HCT: 31.9 % — ABNORMAL LOW (ref 40.0–52.0)
Hemoglobin: 11 g/dL — ABNORMAL LOW (ref 13.0–18.0)
MCH: 33.7 pg (ref 26.0–34.0)
MCHC: 34.5 g/dL (ref 32.0–36.0)
MCV: 97.6 fL (ref 80.0–100.0)
PLATELETS: 434 10*3/uL (ref 150–440)
RBC: 3.27 MIL/uL — AB (ref 4.40–5.90)
RDW: 16.6 % — AB (ref 11.5–14.5)
WBC: 5.8 10*3/uL (ref 3.8–10.6)

## 2017-06-20 LAB — ETHANOL: ALCOHOL ETHYL (B): 100 mg/dL — AB (ref <5)

## 2017-06-20 NOTE — ED Triage Notes (Signed)
Pt to triage via wheelchair.  Pt reports he has RA.  Pt states he has bodyaches.  Last steroid injection was 3 weeks ago.  Pt alert.  Pt admits to etoh use today.

## 2017-06-20 NOTE — ED Notes (Signed)
Pt states having generalized body pains that started about 5pm today.

## 2017-06-20 NOTE — ED Notes (Signed)
Wife Quenten Nawaz [phone] would like RN and/or MD to call with any questions.

## 2017-06-21 MED ORDER — ETODOLAC 200 MG PO CAPS: 200.0000 mg | ORAL_CAPSULE | Freq: Three times a day (TID) | ORAL | 0 refills | Status: DC

## 2017-06-21 MED ORDER — METHYLPREDNISOLONE SODIUM SUCC 125 MG IJ SOLR
125.0000 mg | Freq: Once | INTRAMUSCULAR | Status: AC
  Administered 2017-06-21: 125 mg via INTRAVENOUS
  Filled 2017-06-21: qty 2

## 2017-06-21 MED ORDER — SODIUM CHLORIDE 0.9 % IV BOLUS (SEPSIS)
1000.0000 mL | Freq: Once | INTRAVENOUS | Status: AC
  Administered 2017-06-21: 1000 mL via INTRAVENOUS

## 2017-06-21 MED ORDER — KETOROLAC TROMETHAMINE 30 MG/ML IJ SOLN
30.0000 mg | Freq: Once | INTRAMUSCULAR | Status: AC
  Administered 2017-06-21: 30 mg via INTRAVENOUS
  Filled 2017-06-21: qty 1

## 2017-06-21 MED ORDER — PREDNISONE 20 MG PO TABS: 60.0000 mg | ORAL_TABLET | Freq: Every day | ORAL | 0 refills | Status: DC

## 2017-06-21 NOTE — ED Provider Notes (Signed)
[facility] Emergency Department Provider Note   ____________________________________________   First MD Initiated Contact with Patient 06/20/17 2358     (approximate)  I have reviewed the triage vital signs and the nursing notes.   HISTORY  Chief Complaint Generalized Body Aches    HPI Troy Shepherd is a 71 y.o. male who comes into the hospital today with pain all over. The pain started around 5 PM. He didn't take anything for pain at home. He felt like it wasn't going to last. He started feeling nauseous as well. He felt warm but didn't take his temperature. The patient denies any abdominal pain chest pain or vomiting. He did feel a little dizzy. He ate around noon and states that he drank a little bit of alcohol. He reports that he drank a bottle of water today and had a CT which for dinner. The patient denies any specific joint pain but his left ankle is mildly swollen. The patient rates his pain a 7 out of 10 in intensity. He has been here for similar symptoms in the past and was treated with steroids. The patient states his doctor took him off of steroids about 2 months ago. He is here today for evaluation. He does have a history of rheumatoid arthritis.   Past Medical History:  Diagnosis Date  . Alcohol abuse    quit 09/2014  . Arthritis   . Hypertension   . Leukopenia    resolved when stopped drinking 09/2014    Patient Active Problem List   Diagnosis Date Noted  . Acute viral syndrome 05/09/2017  . Tick bite of groin 05/09/2017  . Generalized weakness 05/09/2017  . Ambulatory dysfunction 05/09/2017  . Alcohol dependence ([place]) 03/05/2017  . Sepsis ([place]) 01/06/2016  . Hypokalemia 01/06/2016  . HTN (hypertension) 01/06/2016  . Nausea and vomiting 01/06/2016  . Dehydration 01/06/2016    Past Surgical History:  Procedure Laterality Date  . APPENDECTOMY    . COLONOSCOPY N/A 11/09/2015   Procedure: COLONOSCOPY;  Surgeon: Josefine Class, MD;  Location: [facility] ENDOSCOPY;  Service: Endoscopy;  Laterality: N/A;    Prior to Admission medications   Medication Sig Start Date End Date Taking? Authorizing Provider  amLODipine (NORVASC) 5 MG tablet Take by mouth daily.     [provider]  etodolac (LODINE) 200 MG capsule Take 1 capsule (200 mg total) by mouth every 8 (eight) hours. 06/21/17   Loney Hering, MD  lisinopril (PRINIVIL,ZESTRIL) 10 MG tablet Take 10 mg by mouth daily. 02/14/17 02/14/18  [provider]  methylPREDNISolone (MEDROL DOSEPAK) 4 MG TBPK tablet Take as directed 06/03/17   Paulette Blanch, MD  oxyCODONE-acetaminophen (ROXICET) 5-325 MG tablet Take 1 tablet by mouth every 6 (six) hours as needed for severe pain. 06/03/17   Paulette Blanch, MD  predniSONE (DELTASONE) 20 MG tablet Take 3 tablets (60 mg total) by mouth daily. 06/21/17   Loney Hering, MD    Allergies Patient has no known allergies.  Family History  Problem Relation Age of Onset  . COPD Sister     Social History Social History  Substance Use Topics  . Smoking status: Current Every Day Smoker    Packs/day: 0.10    Years: 40.00    Types: Cigarettes  . Smokeless tobacco: Never Used  . Alcohol use 1.8 oz/week    3 Shots of liquor per week     Comment: per pt drinks twice/d 5 days a wk  Review of Systems  Constitutional: Feeling warm Eyes: No visual changes. ENT: No sore throat. Cardiovascular: Denies chest pain. Respiratory: Denies shortness of breath. Gastrointestinal: No abdominal pain.  No nausea, no vomiting.  No diarrhea.  No constipation. Genitourinary: Negative for dysuria. Musculoskeletal: Body aches, left ankle swelling Skin: Negative for rash. Neurological: Negative for headaches, focal weakness or numbness.   ____________________________________________   PHYSICAL EXAM:  VITAL SIGNS: ED Triage Vitals  Enc Vitals Group     BP 06/20/17 2045 (!) 100/56     Pulse Rate 06/20/17 2045 84     Resp  06/20/17 2045 16     Temp 06/20/17 2045 98.5 F (36.9 C)     Temp Source 06/20/17 2045 Oral     SpO2 06/20/17 2045 96 %     Weight 06/20/17 2107 120 lb (54.4 kg)     Height 06/20/17 2107 5\' 8"  (1.727 m)     Head Circumference --      Peak Flow --      Pain Score 06/20/17 2107 7     Pain Loc --      Pain Edu? --      Excl. in [place]? --     Constitutional: Alert and oriented. Well appearing and in Mild distress. Eyes: Conjunctivae are normal. PERRL. EOMI. Head: Atraumatic. Nose: No congestion/rhinnorhea. Mouth/Throat: Mucous membranes are moist.  Oropharynx non-erythematous. Cardiovascular: Normal rate, regular rhythm. Grossly normal heart sounds.  Good peripheral circulation. Respiratory: Normal respiratory effort.  No retractions. Lungs CTAB. Gastrointestinal: Soft and nontender. No distention. Positive bowel sounds Musculoskeletal: No lower extremity tenderness nor edema.  Mild swelling in left ankle Neurologic:  Normal speech and language.  Skin:  Skin is warm, dry and intact.  Psychiatric: Mood and affect are normal.   ____________________________________________   LABS (all labs ordered are listed, but only abnormal results are displayed)  Labs Reviewed  BASIC METABOLIC PANEL - Abnormal; Notable for the following:       Result Value   Sodium 134 (*)    Chloride 100 (*)    BUN 33 (*)    Creatinine, Ser 1.85 (*)    GFR calc non Af Amer 35 (*)    GFR calc Af Amer 41 (*)    All other components within normal limits  CBC - Abnormal; Notable for the following:    RBC 3.27 (*)    Hemoglobin 11.0 (*)    HCT 31.9 (*)    RDW 16.6 (*)    All other components within normal limits  ETHANOL - Abnormal; Notable for the following:    Alcohol, Ethyl (B) 100 (*)    All other components within normal limits   ____________________________________________  EKG  none ____________________________________________  RADIOLOGY  No results  found.  ____________________________________________   PROCEDURES  Procedure(s) performed: None  Procedures  Critical Care performed: No  ____________________________________________   INITIAL IMPRESSION / ASSESSMENT AND PLAN / ED COURSE  Pertinent labs & imaging results that were available during my care of the patient were reviewed by me and considered in my medical decision making (see chart for details).  This is a 71 year old who comes into the hospital today hurting all over. The patient did not take anything for pain. He does have rheumatoid arthritis and has not been taking anything for his disease. I will give the patient dose of Solu-Medrol and a dose of Toradol. I will also give the patient a liter of normal saline as he did not drink much  today. I will reassess the patient once he is received his medications.     The patient feels improved after his medication. He will be discharged to home to follow-up with his primary care physician. I feel that the patient's symptoms are likely due to his rheumatoid arthritis. The patient should follow-up and return if he has any worsening concerns or conditions. ____________________________________________   FINAL CLINICAL IMPRESSION(S) / ED DIAGNOSES  Final diagnoses:  Body aches  Rheumatoid arthritis, involving unspecified site, unspecified rheumatoid factor presence (HCC)      NEW MEDICATIONS STARTED DURING THIS VISIT:  New Prescriptions   ETODOLAC (LODINE) 200 MG CAPSULE    Take 1 capsule (200 mg total) by mouth every 8 (eight) hours.   PREDNISONE (DELTASONE) 20 MG TABLET    Take 3 tablets (60 mg total) by mouth daily.     Note:  This document was prepared using Dragon voice recognition software and may include unintentional dictation errors.    Loney Hering, MD 06/21/17 [phone]

## 2017-06-21 NOTE — Discharge Instructions (Signed)
PLease follow up with your primary care provider for further evaluation of your symptoms

## 2017-06-30 NOTE — Telephone Encounter (Signed)
done

## 2017-08-02 ENCOUNTER — Emergency Department: Payer: Medicare PPO

## 2017-08-02 ENCOUNTER — Emergency Department
Admission: EM | Admit: 2017-08-02 | Discharge: 2017-08-02 | Disposition: A | Discharge status: Home / Self Care | Payer: Medicare PPO | Attending: Emergency Medicine | Admitting: Emergency Medicine

## 2017-08-02 DIAGNOSIS — R0789 Other chest pain: Secondary | ICD-10-CM

## 2017-08-02 DIAGNOSIS — M79604 Pain in right leg: Secondary | ICD-10-CM | POA: Insufficient documentation

## 2017-08-02 DIAGNOSIS — Z79899 Other long term (current) drug therapy: Secondary | ICD-10-CM | POA: Insufficient documentation

## 2017-08-02 DIAGNOSIS — I10 Essential (primary) hypertension: Secondary | ICD-10-CM | POA: Diagnosis not present

## 2017-08-02 DIAGNOSIS — M79605 Pain in left leg: Secondary | ICD-10-CM | POA: Diagnosis not present

## 2017-08-02 DIAGNOSIS — R079 Chest pain, unspecified: Secondary | ICD-10-CM | POA: Diagnosis present

## 2017-08-02 DIAGNOSIS — F17210 Nicotine dependence, cigarettes, uncomplicated: Secondary | ICD-10-CM | POA: Insufficient documentation

## 2017-08-02 LAB — COMPREHENSIVE METABOLIC PANEL
ALBUMIN: 3.3 g/dL — AB (ref 3.5–5.0)
ALK PHOS: 63 U/L (ref 38–126)
ALT: <5 U/L — ABNORMAL LOW (ref 17–63)
AST: 15 U/L (ref 15–41)
Anion gap: 10 (ref 5–15)
BILIRUBIN TOTAL: 0.7 mg/dL (ref 0.3–1.2)
BUN: 26 mg/dL — AB (ref 6–20)
CALCIUM: 9.5 mg/dL (ref 8.9–10.3)
CO2: 23 mmol/L (ref 22–32)
Chloride: 102 mmol/L (ref 101–111)
Creatinine, Ser: 1.3 mg/dL — ABNORMAL HIGH (ref 0.61–1.24)
GFR calc Af Amer: >60 mL/min (ref >60)
GFR, EST NON AFRICAN AMERICAN: 54 mL/min — AB (ref >60)
Glucose, Bld: 80 mg/dL (ref 65–99)
Potassium: 4.7 mmol/L (ref 3.5–5.1)
SODIUM: 135 mmol/L (ref 135–145)
TOTAL PROTEIN: 8.5 g/dL — AB (ref 6.5–8.1)

## 2017-08-02 LAB — CBC WITH DIFFERENTIAL/PLATELET
BASOS PCT: 1 %
Basophils Absolute: 0.1 10*3/uL (ref 0–0.1)
EOS ABS: 0.2 10*3/uL (ref 0–0.7)
Eosinophils Relative: 3 %
HCT: 36 % — ABNORMAL LOW (ref 40.0–52.0)
Hemoglobin: 12.1 g/dL — ABNORMAL LOW (ref 13.0–18.0)
Lymphocytes Relative: 11 %
Lymphs Abs: 0.6 10*3/uL — ABNORMAL LOW (ref 1.0–3.6)
MCH: 31.3 pg (ref 26.0–34.0)
MCHC: 33.6 g/dL (ref 32.0–36.0)
MCV: 93 fL (ref 80.0–100.0)
MONO ABS: 0.7 10*3/uL (ref 0.2–1.0)
MONOS PCT: 12 %
Neutro Abs: 4.2 10*3/uL (ref 1.4–6.5)
Neutrophils Relative %: 73 %
Platelets: 536 10*3/uL — ABNORMAL HIGH (ref 150–440)
RBC: 3.87 MIL/uL — ABNORMAL LOW (ref 4.40–5.90)
RDW: 16.5 % — AB (ref 11.5–14.5)
WBC: 5.8 10*3/uL (ref 3.8–10.6)

## 2017-08-02 LAB — BRAIN NATRIURETIC PEPTIDE: B NATRIURETIC PEPTIDE 5: 24 pg/mL (ref 0.0–100.0)

## 2017-08-02 LAB — TROPONIN I: Troponin I: <0.03 ng/mL (ref <0.03)

## 2017-08-02 NOTE — ED Notes (Signed)
Patients IV was discontinued.

## 2017-08-02 NOTE — ED Provider Notes (Addendum)
[facility] Emergency Department Provider Note  ____________________________________________   I have reviewed the triage vital signs and the nursing notes.   HISTORY  Chief Complaint Chest Pain    HPI Troy Shepherd is a 71 y.o. male who presents today complaining of left-sided chest pain since yesterday. Very vaguely described. States it's sharp achy kind of pain, comes and goes. His been there most of the time since yesterday. Took some Tylenol and went away. He is not having pain right now. He does a fever or chills denies any shortness of breath or leg swelling, denies any cough. This persistent vaguely described chest discomfort on the left side. Did not seem to radiate. Does have diffuse body aches as well but does not seem to have any significant discomfort at this moment. I was not exertional pain. It was not pleuritic pain.Pain is very poorly described appears to been gradual onset at some point in the afternoon yesterday, denies trauma or fall  Past Medical History:  Diagnosis Date  . Alcohol abuse    quit 09/2014  . Arthritis   . Hypertension   . Leukopenia    resolved when stopped drinking 09/2014    Patient Active Problem List   Diagnosis Date Noted  . Acute viral syndrome 05/09/2017  . Tick bite of groin 05/09/2017  . Generalized weakness 05/09/2017  . Ambulatory dysfunction 05/09/2017  . Alcohol dependence ([place]) 03/05/2017  . Sepsis ([place]) 01/06/2016  . Hypokalemia 01/06/2016  . HTN (hypertension) 01/06/2016  . Nausea and vomiting 01/06/2016  . Dehydration 01/06/2016    Past Surgical History:  Procedure Laterality Date  . APPENDECTOMY    . COLONOSCOPY N/A 11/09/2015   Procedure: COLONOSCOPY;  Surgeon: Josefine Class, MD;  Location: [facility] ENDOSCOPY;  Service: Endoscopy;  Laterality: N/A;    Prior to Admission medications   Medication Sig Start Date End Date Taking? Authorizing Provider  amLODipine (NORVASC) 5 MG tablet  Take by mouth daily.     [provider]  etodolac (LODINE) 200 MG capsule Take 1 capsule (200 mg total) by mouth every 8 (eight) hours. 06/21/17   Loney Hering, MD  lisinopril (PRINIVIL,ZESTRIL) 10 MG tablet Take 10 mg by mouth daily. 02/14/17 02/14/18  [provider]  methylPREDNISolone (MEDROL DOSEPAK) 4 MG TBPK tablet Take as directed 06/03/17   Paulette Blanch, MD  oxyCODONE-acetaminophen (ROXICET) 5-325 MG tablet Take 1 tablet by mouth every 6 (six) hours as needed for severe pain. 06/03/17   Paulette Blanch, MD  predniSONE (DELTASONE) 20 MG tablet Take 3 tablets (60 mg total) by mouth daily. 06/21/17   Loney Hering, MD    Allergies Patient has no known allergies.  Family History  Problem Relation Age of Onset  . COPD Sister     Social History Social History  Substance Use Topics  . Smoking status: Current Every Day Smoker    Packs/day: 0.10    Years: 40.00    Types: Cigarettes  . Smokeless tobacco: Never Used  . Alcohol use 1.8 oz/week    3 Shots of liquor per week     Comment: per pt drinks twice/d 5 days a wk    Review of Systems Constitutional: No fever/chills Eyes: No visual changes. ENT: No sore throat. No stiff neck no neck pain Cardiovascular:  + chest pain. Respiratory: Denies shortness of breath. Gastrointestinal:   no vomiting.  No diarrhea.  No constipation. Genitourinary: Negative for dysuria. Musculoskeletal: Negative lower extremity swelling Skin:  Negative for rash. Neurological: Negative for severe headaches, focal weakness or numbness.   ____________________________________________   PHYSICAL EXAM:  VITAL SIGNS: ED Triage Vitals [08/02/17 0816]  Enc Vitals Group     BP 125/68     Pulse Rate 78     Resp 14     Temp 98 F (36.7 C)     Temp Source Oral     SpO2 100 %     Weight 115 lb (52.2 kg)     Height 5\' 11"  (1.803 m)     Head Circumference      Peak Flow      Pain Score 0     Pain Loc      Pain Edu?      Excl.  in [place]?     Constitutional: Alert and oriented. Well appearing and in no acute distress. Eyes: Conjunctivae are normal Head: Atraumatic HEENT: No congestion/rhinnorhea. Mucous membranes are moist.  Oropharynx non-erythematous Neck:   Nontender with no meningismus, no masses, no stridor Cardiovascular: Normal rate, regular rhythm. Grossly normal heart sounds.  Good peripheral circulation. Respiratory: Normal respiratory effort.  No retractions. Lungs CTAB. Chest: Minimal tenderness to palpation left chest wall no crepitus or fall chest Abdominal: Soft and nontender. No distention. No guarding no rebound Back:  There is no focal tenderness or step off.  there is no midline tenderness there are no lesions noted. there is no CVA tenderness Musculoskeletal: No lower extremity tenderness, no upper extremity tenderness. No joint effusions, no DVT signs strong distal pulses no edema Neurologic:  Normal speech and language. No gross focal neurologic deficits are appreciated.  Skin:  Skin is warm, dry and intact. No rash noted. Psychiatric: Mood and affect are normal. Speech and behavior are normal.  ____________________________________________   LABS (all labs ordered are listed, but only abnormal results are displayed)  Labs Reviewed  TROPONIN I  CBC WITH DIFFERENTIAL/PLATELET  BRAIN NATRIURETIC PEPTIDE  COMPREHENSIVE METABOLIC PANEL   ____________________________________________  EKG  I personally interpreted any EKGs ordered by me or triage Sinus rhythm rate 81 bpm normal axis no acute ST elevation or depression, repolarization abnormality likely in the anterior leads. ____________________________________________  DPOEUMPNT  I reviewed any imaging ordered by me or triage that were performed during my shift and, if possible, patient and/or family made aware of any abnormal findings. ____________________________________________   PROCEDURES  Procedure(s) performed:  None  Procedures  Critical Care performed: None  ____________________________________________   INITIAL IMPRESSION / ASSESSMENT AND PLAN / ED COURSE  Pertinent labs & imaging results that were available during my care of the patient were reviewed by me and considered in my medical decision making (see chart for details).  Patient is pain-free here, he has some poorly described chest pain earlier. He denies any fever or chills. He is not coughing. There is some reproducibility to the pain. We will evaluate him cardiac enzymes. Pain started yesterday hopefully the enzymes will give [place] some information EKG is reassuring and there is no evidence of PE or dissection  ----------------------------------------- 11:01 AM on 08/02/2017 -----------------------------------------  Patient states that now his legs are hurting. He has a history of diffuse myalgias for which she is already had a CT scan of his chest in the last month that he states is actually going on a daily basis for several months. We'll send a second troponin, as he does have risk factors of CAD but have very low suspicion for that being the actual cause of his  diffuse chronic aches and pains for which she is following up with rheumatology. Patient's only complaint right now is that he would like to be breakfast, which we will provide him.   ----------------------------------------- 1:20 PM on 08/02/2017 -----------------------------------------  Patient remains with no ongoing symptoms or complaints. He has had off-and-on diffuse pain of this variety for for 5 months. No evidence of acute cardiac or other issue today. We will discharge him with close outpatient follow-up. ____________________________________________   FINAL CLINICAL IMPRESSION(S) / ED DIAGNOSES  Final diagnoses:  None      This chart was dictated using voice recognition software.  Despite best efforts to proofread,  errors can occur which can change  meaning.      Schuyler Amor, MD 08/02/17 0017    Schuyler Amor, MD 08/02/17 1101    Schuyler Amor, MD 08/02/17 [phone]

## 2017-08-02 NOTE — ED Triage Notes (Signed)
Pt presents via EMS c/o left sided chest pain beginning yesterday at apx 1400 per pt report. Pt denies chest pain currently. Denies SOB. Report some nausea, no emesis.

## 2017-08-02 NOTE — ED Notes (Signed)
Patient transported to X-ray 

## 2018-03-27 ENCOUNTER — Observation Stay
Admission: EM | Admit: 2018-03-27 | Discharge: 2018-03-29 | Disposition: A | Discharge status: Home / Self Care | Payer: Medicare HMO | Attending: Internal Medicine | Admitting: Internal Medicine

## 2018-03-27 ENCOUNTER — Other Ambulatory Visit: Payer: Self-pay

## 2018-03-27 ENCOUNTER — Emergency Department: Payer: Medicare HMO

## 2018-03-27 DIAGNOSIS — F1020 Alcohol dependence, uncomplicated: Secondary | ICD-10-CM | POA: Diagnosis present

## 2018-03-27 DIAGNOSIS — F10920 Alcohol use, unspecified with intoxication, uncomplicated: Secondary | ICD-10-CM

## 2018-03-27 DIAGNOSIS — D649 Anemia, unspecified: Secondary | ICD-10-CM | POA: Insufficient documentation

## 2018-03-27 DIAGNOSIS — Y906 Blood alcohol level of 120-199 mg/100 ml: Secondary | ICD-10-CM | POA: Insufficient documentation

## 2018-03-27 DIAGNOSIS — F10220 Alcohol dependence with intoxication, uncomplicated: Secondary | ICD-10-CM | POA: Diagnosis not present

## 2018-03-27 DIAGNOSIS — R197 Diarrhea, unspecified: Secondary | ICD-10-CM | POA: Insufficient documentation

## 2018-03-27 DIAGNOSIS — I129 Hypertensive chronic kidney disease with stage 1 through stage 4 chronic kidney disease, or unspecified chronic kidney disease: Secondary | ICD-10-CM | POA: Insufficient documentation

## 2018-03-27 DIAGNOSIS — R509 Fever, unspecified: Principal | ICD-10-CM | POA: Insufficient documentation

## 2018-03-27 DIAGNOSIS — R9431 Abnormal electrocardiogram [ECG] [EKG]: Secondary | ICD-10-CM | POA: Diagnosis present

## 2018-03-27 DIAGNOSIS — R079 Chest pain, unspecified: Secondary | ICD-10-CM

## 2018-03-27 DIAGNOSIS — R0789 Other chest pain: Secondary | ICD-10-CM | POA: Diagnosis not present

## 2018-03-27 DIAGNOSIS — F17210 Nicotine dependence, cigarettes, uncomplicated: Secondary | ICD-10-CM | POA: Insufficient documentation

## 2018-03-27 DIAGNOSIS — D6959 Other secondary thrombocytopenia: Secondary | ICD-10-CM | POA: Diagnosis not present

## 2018-03-27 DIAGNOSIS — Z7952 Long term (current) use of systemic steroids: Secondary | ICD-10-CM | POA: Insufficient documentation

## 2018-03-27 DIAGNOSIS — M069 Rheumatoid arthritis, unspecified: Secondary | ICD-10-CM | POA: Insufficient documentation

## 2018-03-27 DIAGNOSIS — K8020 Calculus of gallbladder without cholecystitis without obstruction: Secondary | ICD-10-CM | POA: Diagnosis not present

## 2018-03-27 DIAGNOSIS — N189 Chronic kidney disease, unspecified: Secondary | ICD-10-CM | POA: Insufficient documentation

## 2018-03-27 DIAGNOSIS — Z23 Encounter for immunization: Secondary | ICD-10-CM | POA: Insufficient documentation

## 2018-03-27 LAB — URINE DRUG SCREEN, QUALITATIVE (ARMC ONLY)
Amphetamines, Ur Screen: NOT DETECTED
BARBITURATES, UR SCREEN: NOT DETECTED
BENZODIAZEPINE, UR SCRN: NOT DETECTED
CANNABINOID 50 NG, UR ~~LOC~~: NOT DETECTED
Cocaine Metabolite,Ur ~~LOC~~: NOT DETECTED
MDMA (ECSTASY) UR SCREEN: NOT DETECTED
Methadone Scn, Ur: NOT DETECTED
Opiate, Ur Screen: NOT DETECTED
PHENCYCLIDINE (PCP) UR S: NOT DETECTED
Tricyclic, Ur Screen: NOT DETECTED

## 2018-03-27 LAB — BASIC METABOLIC PANEL
ANION GAP: 17 — AB (ref 5–15)
BUN: 14 mg/dL (ref 6–20)
CO2: 22 mmol/L (ref 22–32)
CREATININE: 1.1 mg/dL (ref 0.61–1.24)
Calcium: 8.7 mg/dL — ABNORMAL LOW (ref 8.9–10.3)
Chloride: 103 mmol/L (ref 101–111)
Glucose, Bld: 86 mg/dL (ref 65–99)
Potassium: 3.5 mmol/L (ref 3.5–5.1)
SODIUM: 142 mmol/L (ref 135–145)

## 2018-03-27 LAB — CBC
HEMATOCRIT: 37.7 % — AB (ref 40.0–52.0)
Hemoglobin: 12.6 g/dL — ABNORMAL LOW (ref 13.0–18.0)
MCH: 34.6 pg — ABNORMAL HIGH (ref 26.0–34.0)
MCHC: 33.4 g/dL (ref 32.0–36.0)
MCV: 103.6 fL — ABNORMAL HIGH (ref 80.0–100.0)
Platelets: 203 10*3/uL (ref 150–440)
RBC: 3.64 MIL/uL — ABNORMAL LOW (ref 4.40–5.90)
RDW: 16.1 % — AB (ref 11.5–14.5)
WBC: 6.6 10*3/uL (ref 3.8–10.6)

## 2018-03-27 LAB — TROPONIN I: Troponin I: <0.03 ng/mL (ref <0.03)

## 2018-03-27 LAB — ETHANOL: ALCOHOL ETHYL (B): 133 mg/dL — AB (ref <10)

## 2018-03-27 MED ORDER — DOCUSATE SODIUM 100 MG PO CAPS
100.0000 mg | ORAL_CAPSULE | Freq: Two times a day (BID) | ORAL | Status: DC
  Administered 2018-03-27 – 2018-03-28 (×2): 100 mg via ORAL
  Filled 2018-03-27 (×2): qty 1

## 2018-03-27 MED ORDER — ACETAMINOPHEN 650 MG RE SUPP: 650.0000 mg | Freq: Four times a day (QID) | RECTAL | Status: DC | PRN

## 2018-03-27 MED ORDER — POTASSIUM CHLORIDE IN NACL 20-0.9 MEQ/L-% IV SOLN
INTRAVENOUS | Status: DC
  Administered 2018-03-27: 23:00:00 via INTRAVENOUS
  Filled 2018-03-27 (×2): qty 1000

## 2018-03-27 MED ORDER — ONDANSETRON HCL 4 MG/2ML IJ SOLN: 4.0000 mg | Freq: Four times a day (QID) | INTRAMUSCULAR | Status: DC | PRN

## 2018-03-27 MED ORDER — BISACODYL 10 MG RE SUPP: 10.0000 mg | Freq: Every day | RECTAL | Status: DC | PRN

## 2018-03-27 MED ORDER — METOPROLOL TARTRATE 25 MG PO TABS
25.0000 mg | ORAL_TABLET | Freq: Two times a day (BID) | ORAL | Status: DC
  Administered 2018-03-27 – 2018-03-29 (×4): 25 mg via ORAL
  Filled 2018-03-27 (×4): qty 1

## 2018-03-27 MED ORDER — ONDANSETRON HCL 4 MG PO TABS: 4.0000 mg | ORAL_TABLET | Freq: Four times a day (QID) | ORAL | Status: DC | PRN

## 2018-03-27 MED ORDER — ASPIRIN 81 MG PO CHEW
CHEWABLE_TABLET | ORAL | Status: AC
  Administered 2018-03-27: 324 mg via ORAL
  Filled 2018-03-27: qty 4

## 2018-03-27 MED ORDER — PANTOPRAZOLE SODIUM 40 MG IV SOLR
40.0000 mg | Freq: Two times a day (BID) | INTRAVENOUS | Status: DC
Start: 2018-03-27 — End: 2018-03-29
  Administered 2018-03-27 – 2018-03-29 (×4): 40 mg via INTRAVENOUS
  Filled 2018-03-27 (×4): qty 40

## 2018-03-27 MED ORDER — PNEUMOCOCCAL VAC POLYVALENT 25 MCG/0.5ML IJ INJ
0.5000 mL | INJECTION | INTRAMUSCULAR | Status: AC
  Administered 2018-03-28: 0.5 mL via INTRAMUSCULAR
  Filled 2018-03-27: qty 0.5

## 2018-03-27 MED ORDER — SODIUM CHLORIDE 0.9 % IV SOLN: INTRAVENOUS | Status: DC

## 2018-03-27 MED ORDER — ASPIRIN 81 MG PO CHEW
324.0000 mg | CHEWABLE_TABLET | Freq: Once | ORAL | Status: AC
  Administered 2018-03-27: 324 mg via ORAL

## 2018-03-27 MED ORDER — HEPARIN SODIUM (PORCINE) 5000 UNIT/ML IJ SOLN
5000.0000 [IU] | Freq: Three times a day (TID) | INTRAMUSCULAR | Status: DC
  Administered 2018-03-27 – 2018-03-29 (×5): 5000 [IU] via SUBCUTANEOUS
  Filled 2018-03-27 (×5): qty 1

## 2018-03-27 MED ORDER — ACETAMINOPHEN 325 MG PO TABS
650.0000 mg | ORAL_TABLET | Freq: Four times a day (QID) | ORAL | Status: DC | PRN
  Administered 2018-03-28 (×2): 650 mg via ORAL
  Filled 2018-03-27 (×2): qty 2

## 2018-03-27 MED ORDER — ASPIRIN EC 81 MG PO TBEC
81.0000 mg | DELAYED_RELEASE_TABLET | Freq: Every day | ORAL | Status: DC
  Administered 2018-03-28 – 2018-03-29 (×2): 81 mg via ORAL
  Filled 2018-03-27 (×2): qty 1

## 2018-03-27 MED ORDER — NITROGLYCERIN 0.4 MG SL SUBL: 0.4000 mg | SUBLINGUAL_TABLET | SUBLINGUAL | Status: DC | PRN

## 2018-03-27 MED ORDER — LORAZEPAM 2 MG/ML IJ SOLN: 1.0000 mg | INTRAMUSCULAR | Status: DC | PRN

## 2018-03-27 MED ORDER — LEFLUNOMIDE 20 MG PO TABS
20.0000 mg | ORAL_TABLET | Freq: Every day | ORAL | Status: DC
  Administered 2018-03-28 – 2018-03-29 (×2): 20 mg via ORAL
  Filled 2018-03-27 (×3): qty 1

## 2018-03-27 MED ORDER — ALUM & MAG HYDROXIDE-SIMETH 200-200-20 MG/5ML PO SUSP: 30.0000 mL | ORAL | Status: DC | PRN

## 2018-03-27 MED ORDER — OXYCODONE-ACETAMINOPHEN 5-325 MG PO TABS: 1.0000 | ORAL_TABLET | Freq: Four times a day (QID) | ORAL | Status: DC | PRN

## 2018-03-27 MED ORDER — AMLODIPINE BESYLATE 5 MG PO TABS
5.0000 mg | ORAL_TABLET | Freq: Every day | ORAL | Status: DC
  Administered 2018-03-27 – 2018-03-28 (×2): 5 mg via ORAL
  Filled 2018-03-27 (×2): qty 1

## 2018-03-27 NOTE — ED Notes (Signed)
Patient reported to this RN that he would like to leave AMA. Patient cautioned about the potential risks of refusing further care and treatment. Patient verbalized understanding of these risks. Patient continues to report he would like to leave AMA. MD informed.

## 2018-03-27 NOTE — ED Provider Notes (Signed)
[facility] Emergency Department Provider Note  ____________________________________________  Time seen: Approximately 7:30 PM  I have reviewed the triage vital signs and the nursing notes.   HISTORY  Chief Complaint Chest Pain and Alcohol Intoxication   HPI Troy Shepherd is a 72 y.o. male with a history of alcohol abuse, chronic kidney disease, rheumatoid arthritis who presents for evaluation of chest pain.  According to EMS patient was driving under the influence of alcohol when he was pulled over by police and when his breathalyzer turned positive, the passenger of the car convinced the patient to complain of chest pain so he would not go to jail.  The police then called EMS.  Patient reports that the pain started as soon as he was making an illegal turn and saw the police car behind him.  He says that the pain was dull, he is unable to give any more description of the pain other than it was located in both of his arms in the center of his chest.  Patient was brought straight into the emergency room.  He reports that the pain lasted for one hour although between the illegal turn and arrival to the ED less than 30 min passed. He reports no pain at this time. He reports drinking "two fingers of liquor at 3PM." Denies drug use. He smokes 1 pack per 2 weeks. Denies personal or family history of heart attacks.  He denies any pain at this time.  He denies any associated symptoms such as dizziness, shortness of breath, nausea.  Patient does report that he vomited one time earlier today while he was drinking alcohol.  Past Medical History:  Diagnosis Date  . Alcohol abuse    quit 09/2014  . Arthritis   . Hypertension   . Leukopenia    resolved when stopped drinking 09/2014    Patient Active Problem List   Diagnosis Date Noted  . Acute viral syndrome 05/09/2017  . Tick bite of groin 05/09/2017  . Generalized weakness 05/09/2017  . Ambulatory dysfunction 05/09/2017    . Alcohol dependence ([place]) 03/05/2017  . Sepsis ([place]) 01/06/2016  . Hypokalemia 01/06/2016  . HTN (hypertension) 01/06/2016  . Nausea and vomiting 01/06/2016  . Dehydration 01/06/2016    Past Surgical History:  Procedure Laterality Date  . APPENDECTOMY    . COLONOSCOPY N/A 11/09/2015   Procedure: COLONOSCOPY;  Surgeon: Josefine Class, MD;  Location: [facility] ENDOSCOPY;  Service: Endoscopy;  Laterality: N/A;    Prior to Admission medications   Medication Sig Start Date End Date Taking? Authorizing Provider  amLODipine (NORVASC) 5 MG tablet Take by mouth daily.     [provider]  etodolac (LODINE) 200 MG capsule Take 1 capsule (200 mg total) by mouth every 8 (eight) hours. 06/21/17   Loney Hering, MD  methylPREDNISolone (MEDROL DOSEPAK) 4 MG TBPK tablet Take as directed 06/03/17   Paulette Blanch, MD  oxyCODONE-acetaminophen (ROXICET) 5-325 MG tablet Take 1 tablet by mouth every 6 (six) hours as needed for severe pain. 06/03/17   Paulette Blanch, MD  predniSONE (DELTASONE) 20 MG tablet Take 3 tablets (60 mg total) by mouth daily. 06/21/17   Loney Hering, MD    Allergies Patient has no known allergies.  Family History  Problem Relation Age of Onset  . COPD Sister     Social History Social History   Tobacco Use  . Smoking status: Current Every Day Smoker    Packs/day: 0.10  Years: 40.00    Pack years: 4.00    Types: Cigarettes  . Smokeless tobacco: Never Used  Substance Use Topics  . Alcohol use: Yes    Alcohol/week: 1.8 oz    Types: 3 Shots of liquor per week    Comment: per pt drinks twice/d 5 days a wk  . Drug use: No    Review of Systems  Constitutional: Negative for fever. Eyes: Negative for visual changes. ENT: Negative for sore throat. Neck: No neck pain  Cardiovascular: +chest pain. Respiratory: Negative for shortness of breath. Gastrointestinal: Negative for abdominal pain, vomiting or diarrhea. Genitourinary: Negative for  dysuria. Musculoskeletal: Negative for back pain. Skin: Negative for rash. Neurological: Negative for headaches, weakness or numbness. Psych: No SI or HI  ____________________________________________   PHYSICAL EXAM:  VITAL SIGNS: Vitals:   03/27/18 1926 03/27/18 1932  BP:  (!) 173/78  Pulse:  93  Resp: 20 18  Temp:  100 F (37.8 C)  SpO2:  95%   Constitutional: Alert and oriented, looks intoxicated, slow to answer questions.  HEENT:      Head: Normocephalic and atraumatic.         Eyes: Conjunctivae are normal. Sclera is non-icteric.       Mouth/Throat: Mucous membranes are dry.       Neck: Supple with no signs of meningismus. Cardiovascular: Regular rate and rhythm. No murmurs, gallops, or rubs. 2+ symmetrical distal pulses are present in all extremities. No JVD. Respiratory: Normal respiratory effort. Lungs are clear to auscultation bilaterally. No wheezes, crackles, or rhonchi.  Gastrointestinal: Soft, non tender, and non distended with positive bowel sounds. No rebound or guarding. Musculoskeletal: Nontender with normal range of motion in all extremities. No edema, cyanosis, or erythema of extremities. Neurologic: Normal speech and language. Face is symmetric. Moving all extremities. No gross focal neurologic deficits are appreciated. Skin: Skin is warm, dry and intact. No rash noted. Psychiatric: Mood and affect are normal. Speech and behavior are normal.  ____________________________________________   LABS (all labs ordered are listed, but only abnormal results are displayed)  Labs Reviewed  CBC - Abnormal; Notable for the following components:      Result Value   RBC 3.64 (*)    Hemoglobin 12.6 (*)    HCT 37.7 (*)    MCV 103.6 (*)    MCH 34.6 (*)    RDW 16.1 (*)    All other components within normal limits  BASIC METABOLIC PANEL - Abnormal; Notable for the following components:   Calcium 8.7 (*)    Anion gap 17 (*)    All other components within normal  limits  ETHANOL - Abnormal; Notable for the following components:   Alcohol, Ethyl (B) 133 (*)    All other components within normal limits  TROPONIN I  URINE DRUG SCREEN, QUALITATIVE ([facility] ONLY)  TROPONIN I   ____________________________________________  EKG  ED ECG REPORT I, Rudene Re, the attending physician, personally viewed and interpreted this ECG.  Normal sinus rhythm, rate of 94, normal intervals, normal axis, T wave inversions in lateral leads with 63mm STE in V3. These changes are new when compared to prior  19:32 -normal sinus rhythm, rate of 94, LVH, normal axis, normal intervals, persistent T wave inversions in lateral leads with ST elevation in V3.  Unchanged from initial. ____________________________________________  RADIOLOGY  I have personally reviewed the images performed during this visit and I agree with the Radiologist's read.   Interpretation by Radiologist:  Dg Chest 2  View  Result Date: 03/27/2018 CLINICAL DATA:  Chest pain. EXAM: CHEST - 2 VIEW COMPARISON:  Radiographs of August 02, 2017. FINDINGS: The heart size and mediastinal contours are within normal limits. Both lungs are clear. No pneumothorax or pleural effusion is noted. The visualized skeletal structures are unremarkable. IMPRESSION: No active cardiopulmonary disease. Electronically Signed   By: Marijo Conception, M.D.   On: 03/27/2018 20:09      ____________________________________________   PROCEDURES  Procedure(s) performed: None Procedures Critical Care performed:  None ____________________________________________   INITIAL IMPRESSION / ASSESSMENT AND PLAN / ED COURSE   72 y.o. male with a history of alcohol abuse, chronic kidney disease, rheumatoid arthritis who presents for evaluation of chest pain.  Initially patient arrived with history provided by EMS saying that patient was driving under the influence and he was about to be arrested when the passenger in the car convince  patient to complain of chest pain to avoid going to jail.  Patient reports that the pain has resolved and at this time he has no chest pain, shortness of breath or dizziness.  However upon arrival to the emergency room patient does have an EKG showing T wave inversions in lateral leads with 1 mm ST elevation in V3.  EKG does not meet STEMI criteria at this time but it is concerning especially since these changes are new when compared to prior.  Will monitor patient closely for any signs of chest pain.  Will give a full dose of aspirin.  Will check labs including troponin.    _________________________ 8:40 PM on 03/27/2018 -----------------------------------------  First troponin is negative.  Patient remains asymptomatic.  Repeat EKG unchanged from prior.  Will discuss with the hospitalist for admission for chest pain with abnormal EKG.   As part of my medical decision making, I reviewed the following data within the [place] notes reviewed and incorporated, Labs reviewed , EKG interpreted , Old EKG reviewed, Radiograph reviewed , Discussed with admitting physician , Notes from prior ED visits and [place] Controlled Substance Database    Pertinent labs & imaging results that were available during my care of the patient were reviewed by me and considered in my medical decision making (see chart for details).    ____________________________________________   FINAL CLINICAL IMPRESSION(S) / ED DIAGNOSES  Final diagnoses:  Chest pain, unspecified type  Abnormal EKG  Alcoholic intoxication without complication ([place])      NEW MEDICATIONS STARTED DURING THIS VISIT:  ED Discharge Orders    None       Note:  This document was prepared using Dragon voice recognition software and may include unintentional dictation errors.    Rudene Re, MD 03/27/18 2041

## 2018-03-27 NOTE — ED Notes (Signed)
Patient now reports he will stay for further treatment and evaluation.

## 2018-03-27 NOTE — ED Notes (Signed)
[employer] officer at bedside

## 2018-03-27 NOTE — ED Triage Notes (Addendum)
Pt pulled over by police for impaired driving. Per ems pt began to have chest pain after encounter with police. md at bedside. fsbs 89. Pt is intoxicated. Pt denies pain currently.

## 2018-03-27 NOTE — H&P (Signed)
History and Physical    Troy Shepherd BHA:[id number] DOB: 01/19/1946 DOA: 03/27/2018  Referring physician: Dr. Alfred Levins PCP: Gwynneth Aliment Howell Rucks, MD  Specialists: none  Chief Complaint: chest pain  HPI: Troy Shepherd is a 72 y.o. male has a past medical history significant for HTN now brought to ER by police after being pulled over for DUI and complaining of CP. EKG in ER abnormal. Troponin # 1 normal. CP has now resolved. He is now admitted. ETOH level elevated. Platelets low and anemic as well. NN/V/D. Denies SOB  Review of Systems: The patient denies anorexia, fever, weight loss,, vision loss, decreased hearing, hoarseness,  syncope, dyspnea on exertion, peripheral edema, balance deficits, hemoptysis, abdominal pain, melena, hematochezia, severe indigestion/heartburn, hematuria, incontinence, genital sores, muscle weakness, suspicious skin lesions, transient blindness, difficulty walking, depression, unusual weight change, abnormal bleeding, enlarged lymph nodes, angioedema, and breast masses.   Past Medical History:  Diagnosis Date  . Alcohol abuse    quit 09/2014  . Arthritis   . Hypertension   . Leukopenia    resolved when stopped drinking 09/2014   Past Surgical History:  Procedure Laterality Date  . APPENDECTOMY    . COLONOSCOPY N/A 11/09/2015   Procedure: COLONOSCOPY;  Surgeon: Josefine Class, MD;  Location: [facility] ENDOSCOPY;  Service: Endoscopy;  Laterality: N/A;   Social History:  reports that he has been smoking cigarettes.  He has a 4.00 pack-year smoking history. He has never used smokeless tobacco. He reports that he drinks about 1.8 oz of alcohol per week. He reports that he does not use drugs.  No Known Allergies  Family History  Problem Relation Age of Onset  . COPD Sister     Prior to Admission medications   Medication Sig Start Date End Date Taking? Authorizing Provider  amLODipine (NORVASC) 5 MG tablet Take by mouth daily.     [provider]   etodolac (LODINE) 200 MG capsule Take 1 capsule (200 mg total) by mouth every 8 (eight) hours. 06/21/17   Loney Hering, MD  methylPREDNISolone (MEDROL DOSEPAK) 4 MG TBPK tablet Take as directed 06/03/17   Paulette Blanch, MD  oxyCODONE-acetaminophen (ROXICET) 5-325 MG tablet Take 1 tablet by mouth every 6 (six) hours as needed for severe pain. 06/03/17   Paulette Blanch, MD  predniSONE (DELTASONE) 20 MG tablet Take 3 tablets (60 mg total) by mouth daily. 06/21/17   Loney Hering, MD   Physical Exam: Vitals:   03/27/18 1926 03/27/18 1930 03/27/18 1932  BP:   (!) 173/78  Pulse:   93  Resp: 20  18  Temp:   100 F (37.8 C)  TempSrc: Oral  Oral  SpO2:   95%  Weight:  52.2 kg (115 lb)      General:  No apparent distress, WDWN, [place]/AT  Eyes: PERRL, EOMI, no scleral icterus, conjunctiva clear  ENT: moist oropharynx without exudate, TM's benign, dentition fair  Neck: supple, no lymphadenopathy. No bruits or thyromegaly  Cardiovascular: regular rate without MRG; 2+ peripheral pulses, no JVD, no peripheral edema  Respiratory: CTA biL, good air movement without wheezing, rhonchi or crackled. Respiratory effort normal  Abdomen: soft, non tender to palpation, positive bowel sounds, no guarding, no rebound  Skin: no rashes or lesions  Musculoskeletal: normal bulk and tone, no joint swelling  Psychiatric: normal mood and affect, A&OX3  Neurologic: CN 2-12 grossly intact, Motor strength 5/5 in all 4 groups with symmetric DTr's and non-focal sensory exam  Labs on  Admission:  Basic Metabolic Panel: Recent Labs  Lab 03/27/18 1933  NA 142  K 3.5  CL 103  CO2 22  GLUCOSE 86  BUN 14  CREATININE 1.10  CALCIUM 8.7*   Liver Function Tests: No results for input(s): AST, ALT, ALKPHOS, BILITOT, PROT, ALBUMIN in the last 168 hours. No results for input(s): LIPASE, AMYLASE in the last 168 hours. No results for input(s): AMMONIA in the last 168 hours. CBC: Recent Labs  Lab 03/27/18 1933   WBC 6.6  HGB 12.6*  HCT 37.7*  MCV 103.6*  PLT 203   Cardiac Enzymes: Recent Labs  Lab 03/27/18 1933  TROPONINI <0.03    BNP (last 3 results) Recent Labs    08/02/17 0838  BNP 24.0    ProBNP (last 3 results) No results for input(s): PROBNP in the last 8760 hours.  CBG: No results for input(s): GLUCAP in the last 168 hours.  Radiological Exams on Admission: Dg Chest 2 View  Result Date: 03/27/2018 CLINICAL DATA:  Chest pain. EXAM: CHEST - 2 VIEW COMPARISON:  Radiographs of August 02, 2017. FINDINGS: The heart size and mediastinal contours are within normal limits. Both lungs are clear. No pneumothorax or pleural effusion is noted. The visualized skeletal structures are unremarkable. IMPRESSION: No active cardiopulmonary disease. Electronically Signed   By: Marijo Conception, M.D.   On: 03/27/2018 20:09    EKG: Independently reviewed.  Assessment/Plan Principal Problem:   Chest pain Active Problems:   Alcohol dependence (HCC)   Abnormal EKG   Anemia   Will observe on telemetry and follow enzymes. Guaiac stools. Echo ordered. Consult Cardiology. Ativan as needed for ETOH abuse  Diet: clear liquids Fluids: NS@75  DVT Prophylaxis: SQ Heparin  Code Status: FULL  Family Communication: none  Disposition Plan: home  Time spent: 50 min

## 2018-03-27 NOTE — ED Notes (Signed)
This RN asked to perform forensic blood draw by [facility] [employer] officer Ottis Stain; Troy Shepherd. Troy voices good understanding of blood draw to be performed for forensic testing; Troy verifies identity with name and DOB; using sealed kit provided by officer, tourniquet applied to right upper arm; right antecubital region prepped with betadine swab and allowed to dry completely; needle inserted and 2 grey top blood tubes collected; tourniquet removed, needle removed & intact, dressing applied; tubes labeled, given to officer and placed in sealed container using chain of custody; Troy tolerated well. Troy signed consent for blood draw for this RN. Consent form placed in Troy chart.

## 2018-03-27 NOTE — ED Notes (Signed)
Patient transported to X-ray 

## 2018-03-28 ENCOUNTER — Observation Stay: Payer: Medicare HMO

## 2018-03-28 ENCOUNTER — Observation Stay: Admit: 2018-03-28 | Payer: Medicare HMO

## 2018-03-28 DIAGNOSIS — R0789 Other chest pain: Secondary | ICD-10-CM

## 2018-03-28 LAB — URINALYSIS, COMPLETE (UACMP) WITH MICROSCOPIC
BACTERIA UA: NONE SEEN
BILIRUBIN URINE: NEGATIVE
Glucose, UA: NEGATIVE mg/dL
Hgb urine dipstick: NEGATIVE
KETONES UR: NEGATIVE mg/dL
Leukocytes, UA: NEGATIVE
Nitrite: NEGATIVE
PROTEIN: NEGATIVE mg/dL
Specific Gravity, Urine: 1.024 (ref 1.005–1.030)
pH: 6 (ref 5.0–8.0)

## 2018-03-28 LAB — CBC
HCT: 34.5 % — ABNORMAL LOW (ref 40.0–52.0)
Hemoglobin: 12 g/dL — ABNORMAL LOW (ref 13.0–18.0)
MCH: 35.4 pg — AB (ref 26.0–34.0)
MCHC: 34.7 g/dL (ref 32.0–36.0)
MCV: 102.1 fL — AB (ref 80.0–100.0)
PLATELETS: 176 10*3/uL (ref 150–440)
RBC: 3.38 MIL/uL — AB (ref 4.40–5.90)
RDW: 16.1 % — AB (ref 11.5–14.5)
WBC: 10.3 10*3/uL (ref 3.8–10.6)

## 2018-03-28 LAB — INFLUENZA PANEL BY PCR (TYPE A & B)
INFLBPCR: NEGATIVE
Influenza A By PCR: NEGATIVE

## 2018-03-28 LAB — COMPREHENSIVE METABOLIC PANEL
ALK PHOS: 72 U/L (ref 38–126)
ALT: 7 U/L — AB (ref 17–63)
AST: 28 U/L (ref 15–41)
Albumin: 3.3 g/dL — ABNORMAL LOW (ref 3.5–5.0)
Anion gap: 10 (ref 5–15)
BUN: 15 mg/dL (ref 6–20)
CALCIUM: 8 mg/dL — AB (ref 8.9–10.3)
CHLORIDE: 101 mmol/L (ref 101–111)
CO2: 26 mmol/L (ref 22–32)
CREATININE: 1.1 mg/dL (ref 0.61–1.24)
Glucose, Bld: 114 mg/dL — ABNORMAL HIGH (ref 65–99)
Potassium: 4.1 mmol/L (ref 3.5–5.1)
SODIUM: 137 mmol/L (ref 135–145)
Total Bilirubin: 2 mg/dL — ABNORMAL HIGH (ref 0.3–1.2)
Total Protein: 7.3 g/dL (ref 6.5–8.1)

## 2018-03-28 LAB — TROPONIN I

## 2018-03-28 MED ORDER — LORAZEPAM 2 MG/ML IJ SOLN: 1.0000 mg | Freq: Four times a day (QID) | INTRAMUSCULAR | Status: DC | PRN

## 2018-03-28 MED ORDER — AMLODIPINE BESYLATE 5 MG PO TABS
5.0000 mg | ORAL_TABLET | Freq: Two times a day (BID) | ORAL | Status: DC
  Administered 2018-03-28 – 2018-03-29 (×2): 5 mg via ORAL
  Filled 2018-03-28 (×2): qty 1

## 2018-03-28 MED ORDER — ADULT MULTIVITAMIN W/MINERALS CH: 1.0000 | ORAL_TABLET | Freq: Every day | ORAL | 0 refills | Status: AC

## 2018-03-28 MED ORDER — VITAMIN B-1 100 MG PO TABS
100.0000 mg | ORAL_TABLET | Freq: Every day | ORAL | Status: DC
  Administered 2018-03-28 – 2018-03-29 (×2): 100 mg via ORAL
  Filled 2018-03-28 (×2): qty 1

## 2018-03-28 MED ORDER — FOLIC ACID 1 MG PO TABS: 1.0000 mg | ORAL_TABLET | Freq: Every day | ORAL | 0 refills | Status: AC

## 2018-03-28 MED ORDER — ADULT MULTIVITAMIN W/MINERALS CH
1.0000 | ORAL_TABLET | Freq: Every day | ORAL | Status: DC
  Administered 2018-03-28 – 2018-03-29 (×2): 1 via ORAL
  Filled 2018-03-28 (×2): qty 1

## 2018-03-28 MED ORDER — LORAZEPAM 1 MG PO TABS: 1.0000 mg | ORAL_TABLET | Freq: Four times a day (QID) | ORAL | Status: DC | PRN

## 2018-03-28 MED ORDER — ASPIRIN 81 MG PO TBEC: 81.0000 mg | DELAYED_RELEASE_TABLET | Freq: Every day | ORAL | 0 refills | Status: AC

## 2018-03-28 MED ORDER — SODIUM CHLORIDE 0.9 % IV SOLN
1.0000 g | INTRAVENOUS | Status: DC
  Administered 2018-03-28: 1 g via INTRAVENOUS
  Filled 2018-03-28 (×2): qty 10

## 2018-03-28 MED ORDER — THIAMINE HCL 100 MG/ML IJ SOLN: 100.0000 mg | Freq: Every day | INTRAMUSCULAR | Status: DC

## 2018-03-28 MED ORDER — FOLIC ACID 1 MG PO TABS
1.0000 mg | ORAL_TABLET | Freq: Every day | ORAL | Status: DC
  Administered 2018-03-28 – 2018-03-29 (×2): 1 mg via ORAL
  Filled 2018-03-28 (×2): qty 1

## 2018-03-28 MED ORDER — THIAMINE HCL 100 MG PO TABS: 100.0000 mg | ORAL_TABLET | Freq: Every day | ORAL | 0 refills | Status: DC

## 2018-03-28 MED ORDER — METOPROLOL TARTRATE 25 MG PO TABS: 25.0000 mg | ORAL_TABLET | Freq: Two times a day (BID) | ORAL | 0 refills | Status: DC

## 2018-03-28 NOTE — Progress Notes (Signed)
Patient ambulated around nurses station twice with standby assistance. No c/o pain, lightheadedness, or dizziness.

## 2018-03-28 NOTE — Consult Note (Signed)
Cardiology Consultation:   Patient ID: Troy Shepherd; [id number]; 02/23/1946   Admit date: 03/27/2018 Date of Consult: 03/28/2018  Primary Care Provider: Herminio Commons, MD Primary Cardiologist:New  Primary Electrophysiologist:     Patient Profile:   Troy Shepherd is a 73 y.o. male with a hx of  EtOH abuse and HTN  who is being seen today for the evaluation of CP at the request of Dr Doy Hutching   History of Present Illness:   Troy Shepherd is a 72 yo with history of HTN   Yesterday had DUI  Complained of CP     The patient has no known cardiac problem.   Yesterday he says he was driving when he developed mild chest pressure    Last for a little while then went away   He has never had before.   Denies SOB   No reflux    Discomfort was not pleuritic or positional     Past Medical History:  Diagnosis Date  . Alcohol abuse    quit 09/2014  . Arthritis   . Hypertension   . Leukopenia    resolved when stopped drinking 09/2014    Past Surgical History:  Procedure Laterality Date  . APPENDECTOMY    . COLONOSCOPY N/A 11/09/2015   Procedure: COLONOSCOPY;  Surgeon: Josefine Class, MD;  Location: [facility] ENDOSCOPY;  Service: Endoscopy;  Laterality: N/A;       Inpatient Medications: Scheduled Meds: . amLODipine  5 mg Oral Daily  . aspirin EC  81 mg Oral Daily  . docusate sodium  100 mg Oral BID  . folic acid  1 mg Oral Daily  . heparin  5,000 Units Subcutaneous Q8H  . leflunomide  20 mg Oral Daily  . metoprolol tartrate  25 mg Oral BID  . multivitamin with minerals  1 tablet Oral Daily  . pantoprazole (PROTONIX) IV  40 mg Intravenous Q12H  . thiamine  100 mg Oral Daily   Or  . thiamine  100 mg Intravenous Daily   Continuous Infusions:  PRN Meds:  Allergies:   No Known Allergies  Social History:   Social History   Socioeconomic History  . Marital status: Married    Spouse name: Not on file  . Number of children: Not on file  . Years of education: Not on file  .  Highest education level: Not on file  Occupational History  . Not on file  Social Needs  . Financial resource strain: Not on file  . Food insecurity:    Worry: Not on file    Inability: Not on file  . Transportation needs:    Medical: Not on file    Non-medical: Not on file  Tobacco Use  . Smoking status: Current Every Day Smoker    Packs/day: 0.10    Years: 40.00    Pack years: 4.00    Types: Cigarettes  . Smokeless tobacco: Never Used  Substance and Sexual Activity  . Alcohol use: Yes    Alcohol/week: 1.8 oz    Types: 3 Shots of liquor per week    Comment: per pt drinks twice/d 5 days a wk  . Drug use: No  . Sexual activity: Never  Lifestyle  . Physical activity:    Days per week: Not on file    Minutes per session: Not on file  . Stress: Not on file  Relationships  . Social connections:    Talks on phone: Not on file    Gets together:  Not on file    Attends religious service: Not on file    Active member of club or organization: Not on file    Attends meetings of clubs or organizations: Not on file    Relationship status: Not on file  . Intimate partner violence:    Fear of current or ex partner: Not on file    Emotionally abused: Not on file    Physically abused: Not on file    Forced sexual activity: Not on file  Other Topics Concern  . Not on file  Social History Narrative  . Not on file    Family History:    Family History  Problem Relation Age of Onset  . COPD Sister      ROS:  Please see the history of present illness.  All other ROS reviewed and negative.     Physical Exam/Data:   Vitals:   03/28/18 0238 03/28/18 0500 03/28/18 0643 03/28/18 0900  BP:   (!) 187/89 (!) 176/92  Pulse:   88 89  Resp:   18 18  Temp: 99.1 F (37.3 C)  99.9 F (37.7 C) 99.8 F (37.7 C)  TempSrc: Oral  Oral Oral  SpO2:   94% 96%  Weight:  120 lb 3.2 oz (54.5 kg)    Height:        Intake/Output Summary (Last 24 hours) at 03/28/2018 0939 Last data filed at  03/28/2018 4098 Gross per 24 hour  Intake 491.67 ml  Output 175 ml  Net 316.67 ml   Filed Weights   03/27/18 1930 03/27/18 2154 03/28/18 0500  Weight: 115 lb (52.2 kg) 120 lb 11.2 oz (54.7 kg) 120 lb 3.2 oz (54.5 kg)   Body mass index is 18.83 kg/m.  General:  Well nourished, well developed, in no acute distress HEENT: normal Lymph: no adenopathy Neck: no JVD Endocrine:  No thryomegaly Vascular: No carotid bruits; Cardiac:  normal S1, S2; RRR; no murmur  Lungs:  clear to auscultation bilaterally, no wheezing, rhonchi or rales  Abd: soft, nontender, no hepatomegaly  Ext: no edema Musculoskeletal:  No deformities, BUE and BLE strength normal and equal Skin: warm and dry  Neuro:  CNs 2-12 intact, no focal abnormalities noted Psych:  Normal affect   EKG:  The EKG was personally reviewed and demonstrates:   SR   LVH with strain pattern, cannot exclude ischemia   Telemetry:  Telemetry was personally reviewed and demonstrates:  SR  Laboratory Data:  Chemistry Recent Labs  Lab 03/27/18 1933 03/28/18 0517  NA 142 137  K 3.5 4.1  CL 103 101  CO2 22 26  GLUCOSE 86 114*  BUN 14 15  CREATININE 1.10 1.10  CALCIUM 8.7* 8.0*  GFRNONAA >60 >60  GFRAA >60 >60  ANIONGAP 17* 10    Recent Labs  Lab 03/28/18 0517  PROT 7.3  ALBUMIN 3.3*  AST 28  ALT 7*  ALKPHOS 72  BILITOT 2.0*   Hematology Recent Labs  Lab 03/27/18 1933 03/28/18 0517  WBC 6.6 10.3  RBC 3.64* 3.38*  HGB 12.6* 12.0*  HCT 37.7* 34.5*  MCV 103.6* 102.1*  MCH 34.6* 35.4*  MCHC 33.4 34.7  RDW 16.1* 16.1*  PLT 203 176   Cardiac Enzymes Recent Labs  Lab 03/27/18 1933 03/27/18 2242 03/28/18 0517  TROPONINI <0.03 <0.03 <0.03   No results for input(s): TROPIPOC in the last 168 hours.  BNPNo results for input(s): BNP, PROBNP in the last 168 hours.  DDimer No results for  input(s): DDIMER in the last 168 hours.  Radiology/Studies:  Dg Chest 2 View  Result Date: 03/27/2018 CLINICAL DATA:  Chest  pain. EXAM: CHEST - 2 VIEW COMPARISON:  Radiographs of August 02, 2017. FINDINGS: The heart size and mediastinal contours are within normal limits. Both lungs are clear. No pneumothorax or pleural effusion is noted. The visualized skeletal structures are unremarkable. IMPRESSION: No active cardiopulmonary disease. Electronically Signed   By: Marijo Conception, M.D.   On: 03/27/2018 20:09    Assessment and Plan:   1  Chest pain   Atypical for cardiac  Denies SOB or DOE prior to yesterday   Discomfort mild, not associated with activity    Exam unremarkable    Trop neg  I would follow.   EKG is not normal but consistent with LVH Would get echo (ordered)   I would not sched any further testing unless continues to have or echo shows abnormal LV function  2  HTN  BP is elevated   I would continue amlodipine   Increase to bid     3  Tob  Counselled on cesssation    For questions or updates, please contact [facility] Please consult [email or web address] for contact info under Cardiology/STEMI.   Signed, Dorris Carnes, MD  03/28/2018 9:39 AM

## 2018-03-28 NOTE — Progress Notes (Signed)
Patient ID: Troy Shepherd, male   DOB: 1946/08/18, 72 y.o.   MRN: [id number]  [employer] PROGRESS NOTE  Haseeb Fiallos MWU:[id number] DOB: 11-Dec-1946 DOA: 03/27/2018 PCP: Herminio Commons, MD  HPI/Subjective: Patient was feeling well and wanted to go home.  He had a temperature of 101.  Patient came in with chest pain after being pulled over by the police.  Offers no other complaints.  Couple episodes of diarrhea.  Objective: Vitals:   03/28/18 1102 03/28/18 1412  BP:  (!) 146/83  Pulse:  92  Resp:  16  Temp: (!) 101 F (38.3 C) 99.2 F (37.3 C)  SpO2:  98%    Filed Weights   03/27/18 1930 03/27/18 2154 03/28/18 0500  Weight: 52.2 kg (115 lb) 54.7 kg (120 lb 11.2 oz) 54.5 kg (120 lb 3.2 oz)    ROS: Review of Systems  Constitutional: Positive for fever. Negative for chills.  Eyes: Negative for blurred vision.  Respiratory: Negative for cough and shortness of breath.   Cardiovascular: Negative for chest pain.  Gastrointestinal: Positive for diarrhea. Negative for abdominal pain, constipation, nausea and vomiting.  Genitourinary: Negative for dysuria.  Musculoskeletal: Negative for joint pain.  Neurological: Negative for dizziness and headaches.   Exam: Physical Exam  HENT:  Nose: No mucosal edema.  Mouth/Throat: No oropharyngeal exudate or posterior oropharyngeal edema.  Eyes: Pupils are equal, round, and reactive to light. Conjunctivae, EOM and lids are normal.  Neck: No JVD present. Carotid bruit is not present. No edema present. No thyroid mass and no thyromegaly present.  Cardiovascular: S1 normal and S2 normal. Exam reveals no gallop.  No murmur heard. Pulses:      Dorsalis pedis pulses are 2+ on the right side, and 2+ on the left side.  Respiratory: No respiratory distress. He has no wheezes. He has no rhonchi. He has no rales.  GI: Soft. Bowel sounds are normal. There is no tenderness.  Musculoskeletal:       Right ankle: He exhibits no swelling.       Left  ankle: He exhibits no swelling.  Lymphadenopathy:    He has no cervical adenopathy.  Neurological: He is alert. No cranial nerve deficit.  Skin: Skin is warm. No rash noted. Nails show no clubbing.  Psychiatric: He has a normal mood and affect.      Data Reviewed: Basic Metabolic Panel: Recent Labs  Lab 03/27/18 1933 03/28/18 0517  NA 142 137  K 3.5 4.1  CL 103 101  CO2 22 26  GLUCOSE 86 114*  BUN 14 15  CREATININE 1.10 1.10  CALCIUM 8.7* 8.0*   Liver Function Tests: Recent Labs  Lab 03/28/18 0517  AST 28  ALT 7*  ALKPHOS 72  BILITOT 2.0*  PROT 7.3  ALBUMIN 3.3*   CBC: Recent Labs  Lab 03/27/18 1933 03/28/18 0517  WBC 6.6 10.3  HGB 12.6* 12.0*  HCT 37.7* 34.5*  MCV 103.6* 102.1*  PLT 203 176   Cardiac Enzymes: Recent Labs  Lab 03/27/18 1933 03/27/18 2242 03/28/18 0517  TROPONINI <0.03 <0.03 <0.03   BNP (last 3 results) Recent Labs    08/02/17 0838  BNP 24.0      Studies: Dg Chest 2 View  Result Date: 03/28/2018 CLINICAL DATA:  Fever EXAM: CHEST - 2 VIEW COMPARISON:  03/27/2018 FINDINGS: Large lung volumes with flat diaphragm. Chronic nodular opacity at the right base, reference chest CT 06/01/2017. There is no edema, consolidation, effusion, or pneumothorax. Normal heart size and  stable aortic tortuosity. IMPRESSION: No acute finding.  Stable from priors. Electronically Signed   By: Monte Fantasia M.D.   On: 03/28/2018 13:45   Dg Chest 2 View  Result Date: 03/27/2018 CLINICAL DATA:  Chest pain. EXAM: CHEST - 2 VIEW COMPARISON:  Radiographs of August 02, 2017. FINDINGS: The heart size and mediastinal contours are within normal limits. Both lungs are clear. No pneumothorax or pleural effusion is noted. The visualized skeletal structures are unremarkable. IMPRESSION: No active cardiopulmonary disease. Electronically Signed   By: Marijo Conception, M.D.   On: 03/27/2018 20:09    Scheduled Meds: . amLODipine  5 mg Oral Daily  . aspirin EC  81 mg  Oral Daily  . folic acid  1 mg Oral Daily  . heparin  5,000 Units Subcutaneous Q8H  . leflunomide  20 mg Oral Daily  . metoprolol tartrate  25 mg Oral BID  . multivitamin with minerals  1 tablet Oral Daily  . pantoprazole (PROTONIX) IV  40 mg Intravenous Q12H  . thiamine  100 mg Oral Daily   Or  . thiamine  100 mg Intravenous Daily   Continuous Infusions:  Assessment/Plan:  1. Fever unclear etiology.  Chest x-ray repeated is negative.  White blood cell count normal range.  Influenza swab negative.  Still waiting for urine.  Give empiric Rocephin.  Could be gastroenteritis. 2. Chest pain.  Resolved.  Cardiac enzymes x3-.  Started on aspirin. 3. Accelerated hypertension.  On Norvasc and metoprolol. 4. Alcohol abuse.  No signs of withdrawal currently. 5. Relative thrombocytopenia.  Likely secondary to alcohol abuse.  Code Status:     Code Status Orders  (From admission, onward)        Start     Ordered   03/27/18 2154  Full code  Continuous     03/27/18 2153    Code Status History    Date Active Date Inactive Code Status Order ID Comments User Context   05/09/2017 1455 05/11/2017 2000 Full Code [id number]  Idelle Crouch, MD Inpatient   01/06/2016 2330 01/07/2016 1918 Full Code [id number]  Lance Coon, MD Inpatient     Disposition Plan: Watch temperature curve today  Consultants:  Cardiology  Time spent: 35 minutes  [place]

## 2018-03-28 NOTE — Plan of Care (Signed)
  Problem: Education: Goal: Understanding of cardiac disease, CV risk reduction, and recovery process will improve Outcome: Progressing   Problem: Cardiac: Goal: Ability to achieve and maintain adequate cardiovascular perfusion will improve Outcome: Progressing

## 2018-03-28 NOTE — Progress Notes (Signed)
Patient reports that he "takes a shot everyday". ETOH level 133 on admission. MD Duane Boston made aware. Orders given for CIWA protocol. Will continue to monitor.   Iran Sizer M

## 2018-03-28 NOTE — Care Management Obs Status (Signed)
[place] NOTIFICATION   Patient Details  Name: Delores Thelen MRN: [id number] Date of Birth: Sep 29, 1946   Medicare Observation Status Notification Given:  Yes    Jolly Mango, RN 03/28/2018, 2:56 PM

## 2018-03-29 ENCOUNTER — Telehealth: Payer: Self-pay | Admitting: Cardiovascular Disease

## 2018-03-29 ENCOUNTER — Observation Stay: Payer: Medicare HMO

## 2018-03-29 MED ORDER — LEVOFLOXACIN 750 MG PO TABS
750.0000 mg | ORAL_TABLET | Freq: Every day | ORAL | Status: DC
  Administered 2018-03-29: 750 mg via ORAL
  Filled 2018-03-29: qty 1

## 2018-03-29 MED ORDER — AMLODIPINE BESYLATE 5 MG PO TABS: 5.0000 mg | ORAL_TABLET | Freq: Two times a day (BID) | ORAL | 0 refills | Status: DC

## 2018-03-29 MED ORDER — LEVOFLOXACIN 750 MG PO TABS: 750.0000 mg | ORAL_TABLET | Freq: Every day | ORAL | 0 refills | Status: DC

## 2018-03-29 MED ORDER — AMLODIPINE BESYLATE 5 MG PO TABS: 5.0000 mg | ORAL_TABLET | Freq: Two times a day (BID) | ORAL | 0 refills | Status: AC

## 2018-03-29 MED ORDER — METOPROLOL TARTRATE 25 MG PO TABS: 25.0000 mg | ORAL_TABLET | Freq: Two times a day (BID) | ORAL | 0 refills | Status: AC

## 2018-03-29 NOTE — Progress Notes (Signed)
    Await echo.

## 2018-03-29 NOTE — Discharge Instructions (Signed)
Alcohol Abuse and Nutrition Alcohol abuse is any pattern of alcohol consumption that harms your health, relationships, or work. Alcohol abuse can affect how your body breaks down and absorbs nutrients from food by causing your liver to work abnormally. Additionally, many people who abuse alcohol do not eat enough carbohydrates, protein, fat, vitamins, and minerals. This can cause poor nutrition (malnutrition) and a lack of nutrients (nutrient deficiencies), which can lead to further complications. Nutrients that are commonly lacking (deficient) among people who abuse alcohol include:  Vitamins. ? Vitamin A. This is stored in your liver. It is important for your vision, metabolism, and ability to fight off infections (immunity). ? B vitamins. These include vitamins such as folate, thiamin, and niacin. These are important in new cell growth and maintenance. ? Vitamin C. This plays an important role in iron absorption, wound healing, and immunity. ? Vitamin D. This is produced by your liver, but you can also get vitamin D from food. Vitamin D is necessary for your body to absorb and use calcium.  Minerals. ? Calcium. This is important for your bones and your heart and blood vessel (cardiovascular) function. ? Iron. This is important for blood, muscle, and nervous system functioning. ? Magnesium. This plays an important role in muscle and nerve function, and it helps to control blood sugar and blood pressure. ? Zinc. This is important for the normal function of your nervous system and digestive system (gastrointestinal tract).  Nutrition is an essential component of therapy for alcohol abuse. Your health care provider or dietitian will work with you to design a plan that can help restore nutrients to your body and prevent potential complications. What is my plan? Your dietitian may develop a specific diet plan that is based on your condition and any other complications you may have. A diet plan will  commonly include:  A balanced diet. ? Grains: 6-8 oz per day. ? Vegetables: 2-3 cups per day. ? Fruits: 1-2 cups per day. ? Meat and other protein: 5-6 oz per day. ? Dairy: 2-3 cups per day.  Vitamin and mineral supplements.  What do I need to know about alcohol and nutrition?  Consume foods that are high in antioxidants, such as grapes, berries, nuts, green tea, and dark green and orange vegetables. This can help to counteract some of the stress that is placed on your liver by consuming alcohol.  Avoid food and drinks that are high in fat and sugar. Foods such as sugared soft drinks, salty snack foods, and candy contain empty calories. This means that they lack important nutrients such as protein, fiber, and vitamins.  Eat frequent meals and snacks. Try to eat 5-6 small meals each day.  Eat a variety of fresh fruits and vegetables each day. This will help you get plenty of water, fiber, and vitamins in your diet.  Drink plenty of water and other clear fluids. Try to drink at least 48-64 oz (1.5-2 L) of water per day.  If you are a vegetarian, eat a variety of protein-rich foods. Pair whole grains with plant-based proteins at meals and snacks to obtain the greatest nutrient benefit from your food. For example, eat rice with beans, put peanut butter on whole-grain toast, or eat oatmeal with sunflower seeds.  Soak beans and whole grains overnight before cooking. This can help your body to absorb the nutrients more easily.  Include foods fortified with vitamins and minerals in your diet. Commonly fortified foods include milk, orange juice, cereal, and bread.  If you are malnourished, your dietitian may recommend a high-protein, high-calorie diet. This may include: ? 2,000-3,000 calories (kilocalories) per day. ? 70-100 grams of protein per day.  Your health care provider may recommend a complete nutritional supplement beverage. This can help to restore calories, protein, and vitamins to  your body. Depending on your condition, you may be advised to consume this instead of or in addition to meals.  Limit your intake of caffeine. Replace drinks like coffee and black tea with decaffeinated coffee and herbal tea.  Eat a variety of foods that are high in omega fatty acids. These include fish, nuts and seeds, and soybeans. These foods may help your liver to recover and may also stabilize your mood.  Certain medicines may cause changes in your appetite, taste, and weight. Work with your health care provider and dietitian to make any adjustments to your medicines and diet plan.  Include other healthy lifestyle choices in your daily routine. ? Be physically active. ? Get enough sleep. ? Spend time doing activities that you enjoy.  If you are unable to take in enough food and calories by mouth, your health care provider may recommend a feeding tube. This is a tube that passes through your nose and throat, directly into your stomach. Nutritional supplement beverages can be given to you through the feeding tube to help you get the nutrients you need.  Take vitamin or mineral supplements as recommended by your health care provider. What foods can I eat? Grains Enriched pasta. Enriched rice. Fortified whole-grain bread. Fortified whole-grain cereal. Barley. Brown rice. Quinoa. [place]. Vegetables All fresh, frozen, and canned vegetables. Spinach. Kale. Artichoke. Carrots. Winter squash and pumpkin. Sweet potatoes. Broccoli. Cabbage. Cucumbers. Tomatoes. Sweet peppers. Green beans. Peas. Corn. Fruits All fresh and frozen fruits. Berries. Grapes. Mango. Papaya. Guava. Cherries. Apples. Bananas. Peaches. Plums. Pineapple. Watermelon. Cantaloupe. Oranges. Avocado. Meats and Other Protein Sources Beef liver. Lean beef. Pork. Fresh and canned chicken. Fresh fish. Oysters. Sardines. Canned tuna. Shrimp. Eggs with yolks. Nuts and seeds. Peanut butter. Beans and lentils. Soybeans.  Tofu. Dairy Whole, low-fat, and nonfat milk. Whole, low-fat, and nonfat yogurt. Cottage cheese. Sour cream. Hard and soft cheeses. Beverages Water. Herbal tea. Decaffeinated coffee. Decaffeinated green tea. 100% fruit juice. 100% vegetable juice. Instant breakfast shakes. Condiments Ketchup. Mayonnaise. Mustard. Salad dressing. Barbecue sauce. Sweets and Desserts Sugar-free ice cream. Sugar-free pudding. Sugar-free gelatin. Fats and Oils Butter. Vegetable oil, flaxseed oil, olive oil, and walnut oil. Other Complete nutrition shakes. Protein bars. Sugar-free gum. The items listed above may not be a complete list of recommended foods or beverages. Contact your dietitian for more options. What foods are not recommended? Grains Sugar-sweetened breakfast cereals. Flavored instant oatmeal. Fried breads. Vegetables Breaded or deep-fried vegetables. Fruits Dried fruit with added sugar. Candied fruit. Canned fruit in syrup. Meats and Other Protein Sources Breaded or deep-fried meats. Dairy Flavored milks. Fried cheese curds or fried cheese sticks. Beverages Alcohol. Sugar-sweetened soft drinks. Sugar-sweetened tea. Caffeinated coffee and tea. Condiments Sugar. Honey. Agave nectar. Molasses. Sweets and Desserts Chocolate. Cake. Cookies. Candy. Other Potato chips. Pretzels. Salted nuts. Candied nuts. The items listed above may not be a complete list of foods and beverages to avoid. Contact your dietitian for more information. This information is not intended to replace advice given to you by your health care provider. Make sure you discuss any questions you have with your health care provider. Document Released: 10/09/2005 Document Revised: 04/23/2016 Document Reviewed: 07/18/2014 Elsevier Interactive Patient Education  2018 [place].   Alcohol Intoxication Alcohol intoxication occurs when a person no longer thinks clearly or functions well (becomes impaired) after drinking alcohol.  Intoxication can occur with even one drink. The level of impairment depends on:  The amount of alcohol the person had.  The person's age, gender, and weight.  How often the person drinks.  Whether the person has other medical conditions, such as diabetes, seizures, or a heart condition.  Alcohol intoxication can range in severity from mild to severe. The condition can be dangerous, especially when caused by drinking large amounts of alcohol in a short period of time (binge drinking) or if the person also took certain prescription medicines or recreational drugs. What are the signs or symptoms? Symptoms of mild alcohol intoxication include:  Feeling relaxed or sleepy.  Mild difficulty with: ? Coordination. ? Speech. ? Memory. ? Attention.  Symptoms of moderate alcohol intoxication include:  Extreme emotions, such as anger or sadness.  Moderate difficulty with: ? Coordination. ? Speech. ? Memory. ? Attention.  Symptoms of severe alcohol intoxication include:  Passing out.  Vomiting.  Confusion.  Slow breathing.  Coma.  Severe difficulty with: ? Coordination. ? Speech. ? Memory. ? Attention.  Intoxication, especially in people who are not exposed to alcohol often can progress from mild to severe quickly, and may even cause coma or death. How is this diagnosed? This condition may be diagnosed based on:  A medical history.  A physical exam.  A blood test that measures the concentration of alcohol in the blood (blood alcohol content, or BAC).  Whether there is a smell of alcohol on the breath.  Your health care provider will ask you how much alcohol you drank and what kind of alcohol you had. How is this treated? Usually, treatment is not needed for this condition. Most of the effects of alcohol are temporary and go away as the alcohol naturally leaves the body. Your health care provider may recommend monitoring until the alcohol level starts to drop and it  is safe to go home. You may also get fluids through an IV tube to help prevent dehydration. If the intoxication is severe, a breathing machine called a ventilator may be needed to support your breathing. Follow these instructions at home:  Do not drive after drinking alcohol.  Have someone stay with you while you are intoxicated. You should not be left alone.  Stay hydrated. Drink enough fluid to keep your urine clear or pale yellow.  Avoid caffeine because it can dehydrate you.  Take over-the-counter and prescription medicines only as told by your health care provider. How is this prevented? To prevent alcohol intoxication:  Limit alcohol intake to no more than 1 drink a day for nonpregnant women and 2 drinks a day for men. One drink equals 12 oz of beer, 5 oz of wine, or 1 oz of hard liquor.  Do not drink alcohol on an empty stomach.  Avoid drinking alcohol if: ? You are under the legal drinking age. ? You are pregnant or may be pregnant. ? You are taking medicines that should not be taken with alcohol. ? Your drinking causes your medical condition to get worse. ? You need to drive or perform activities that require your attention. ? You have substance use disorder.  To prevent potentially serious complications of alcohol intoxication, seek immediate medical care if you or someone you know has signs of moderate or severe alcohol intoxication. These include:  Moderate or severe difficulty  with: ? Coordination. ? Speech. ? Memory. ? Attention.  Passing out.  Confusion.  Vomiting.  Do not leave someone alone if he or she is intoxicated. Contact a health care provider if:  You do not feel better after a few days.  You are having problems at work, at school, or at home due to drinking. Get help right away if:  You become shaky when you try to stop drinking.  You shake uncontrollably (have a seizure).  You vomit blood. Blood in vomit may look bright red, or it may  look like coffee grounds.  You have blood in your stool. Blood in stool may be bright red, or it may make stool appear black and tarry and make it smell bad.  You become light-headed or you faint. If you ever feel like you may hurt yourself or others, or have thoughts about taking your own life, get help right away. You can go to your nearest emergency department or call:  Your local emergency services (911 in the [place]  A suicide crisis helpline, such as the [facility] at 907-742-1217. This is open 24 hours a day.  This information is not intended to replace advice given to you by your health care provider. Make sure you discuss any questions you have with your health care provider. Document Released: 09/24/2005 Document Revised: 08/30/2016 Document Reviewed: 08/30/2016 Elsevier Interactive Patient Education  [employer].

## 2018-03-29 NOTE — Discharge Summary (Signed)
[facility] [place] NAME: Troy Shepherd    MR#:  [id number]  DATE OF BIRTH:  Oct 01, 1946  DATE OF ADMISSION:  03/27/2018 ADMITTING PHYSICIAN: Idelle Crouch, MD  DATE OF DISCHARGE: 03/29/2018 12:50 PM  PRIMARY CARE PHYSICIAN: Herminio Commons, MD    ADMISSION DIAGNOSIS:  Abnormal EKG [Z32.99] Alcoholic intoxication without complication (HCC) [M42.683] Chest pain, unspecified type [R07.9]  DISCHARGE DIAGNOSIS:  Principal Problem:   Chest pain Active Problems:   Alcohol dependence (HCC)   Abnormal EKG   Anemia   SECONDARY DIAGNOSIS:   Past Medical History:  Diagnosis Date  . Alcohol abuse    quit 09/2014  . Arthritis   . Hypertension   . Leukopenia    resolved when stopped drinking 09/2014    HOSPITAL COURSE:   1.  Fever of unclear etiology.  The patient's did have a temperature of 101 during the hospital course.  Most of the patient's temperatures were 99.  2 chest x-rays were negative.  Urine analysis was negative.  The patient did have a few episodes of diarrhea but that settled down.  Unclear if this is a viral gastroenteritis.  The patient currently feels well and offers no complaints.  I will give an empiric course of Levaquin.  If patient's fever continues even after the course of antibiotics he will need to be followed up for fever of unknown origin.  The patient does have rheumatoid arthritis so this could be an inflammatory fever.  Ultrasound of the abdomen does show gallstones but no evidence of cholecystitis.  Influenza was negative. 2.  Chest pain this resolved.  Cardiac enzymes x3 were negative.  The patient was started on aspirin. 3.  Accelerated hypertension.  Prescribed Norvasc and metoprolol. 4.  Alcohol abuse.  No signs of withdrawal at this time.  Advised to cut back on alcohol. 5.  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  History of rheumatoid arthritis.  Advised to get to his rheumatologist as  outpatient.   DISCHARGE CONDITIONS:   Fair  CONSULTS OBTAINED:  Treatment Team:  Fay Records, MD  DRUG ALLERGIES:  No Known Allergies  DISCHARGE MEDICATIONS:   Allergies as of 03/29/2018   No Known Allergies     Medication List    STOP taking these medications   etodolac 200 MG capsule Commonly known as:  LODINE   leflunomide 20 MG tablet Commonly known as:  ARAVA   methylPREDNISolone 4 MG Tbpk tablet Commonly known as:  MEDROL DOSEPAK   oxyCODONE-acetaminophen 5-325 MG tablet Commonly known as:  ROXICET   predniSONE 20 MG tablet Commonly known as:  DELTASONE     TAKE these medications   amLODipine 5 MG tablet Commonly known as:  NORVASC Take 1 tablet (5 mg total) by mouth 2 (two) times daily. What changed:    how much to take  when to take this   aspirin 81 MG EC tablet Take 1 tablet (81 mg total) by mouth daily.   folic acid 1 MG tablet Commonly known as:  FOLVITE Take 1 tablet (1 mg total) by mouth daily.   levofloxacin 750 MG tablet Commonly known as:  LEVAQUIN Take 1 tablet (750 mg total) by mouth daily.   metoprolol tartrate 25 MG tablet Commonly known as:  LOPRESSOR Take 1 tablet (25 mg total) by mouth 2 (two) times daily.   multivitamin with minerals Tabs tablet Take 1 tablet by mouth daily.   thiamine 100 MG  tablet Take 1 tablet (100 mg total) by mouth daily.        DISCHARGE INSTRUCTIONS:    Follow-up PMD 1 week Follow-up cardiology as outpatient   If you experience worsening of your admission symptoms, develop shortness of breath, life threatening emergency, suicidal or homicidal thoughts you must seek medical attention immediately by calling 911 or calling your MD immediately  if symptoms less severe.  You Must read complete instructions/literature along with all the possible adverse reactions/side effects for all the Medicines you take and that have been prescribed to you. Take any new Medicines after you have completely  understood and accept all the possible adverse reactions/side effects.   Please note  You were cared for by a hospitalist during your hospital stay. If you have any questions about your discharge medications or the care you received while you were in the hospital after you are discharged, you can call the unit and asked to speak with the hospitalist on call if the hospitalist that took care of you is not available. Once you are discharged, your primary care physician will handle any further medical issues. Please note that NO REFILLS for any discharge medications will be authorized once you are discharged, as it is imperative that you return to your primary care physician (or establish a relationship with a primary care physician if you do not have one) for your aftercare needs so that they can reassess your need for medications and monitor your lab values.    Today   CHIEF COMPLAINT:   Chief Complaint  Patient presents with  . Chest Pain  . Alcohol Intoxication    HISTORY OF PRESENT ILLNESS:  Troy Shepherd  is a 72 y.o. male presented with chest pain.   VITAL SIGNS:  Blood pressure (!) 152/91, pulse 83, temperature 99.8 F (37.7 C), temperature source Oral, resp. rate 14, height 5\' 7"  (1.702 m), weight 54.6 kg (120 lb 4.8 oz), SpO2 98 %.   PHYSICAL EXAMINATION:  GENERAL:  72 y.o.-year-old patient lying in the bed with no acute distress.  EYES: Pupils equal, round, reactive to light and accommodation. No scleral icterus. Extraocular muscles intact.  HEENT: Head atraumatic, normocephalic. Oropharynx and nasopharynx clear.  NECK:  Supple, no jugular venous distention. No thyroid enlargement, no tenderness.  LUNGS: Normal breath sounds bilaterally, no wheezing, rales,rhonchi or crepitation. No use of accessory muscles of respiration.  CARDIOVASCULAR: S1, S2 normal. No murmurs, rubs, or gallops.  ABDOMEN: Soft, non-tender, non-distended. Bowel sounds present. No organomegaly or mass.   EXTREMITIES: No pedal edema, cyanosis, or clubbing.  NEUROLOGIC: Cranial nerves II through XII are intact. Muscle strength 5/5 in all extremities. Sensation intact. Gait not checked.  PSYCHIATRIC: The patient is alert and oriented x 3.  SKIN: No obvious rash, lesion, or ulcer.   DATA REVIEW:   CBC Recent Labs  Lab 03/28/18 0517  WBC 10.3  HGB 12.0*  HCT 34.5*  PLT 176    Chemistries  Recent Labs  Lab 03/28/18 0517  NA 137  K 4.1  CL 101  CO2 26  GLUCOSE 114*  BUN 15  CREATININE 1.10  CALCIUM 8.0*  AST 28  ALT 7*  ALKPHOS 72  BILITOT 2.0*    Cardiac Enzymes Recent Labs  Lab 03/28/18 0517  TROPONINI <0.03     RADIOLOGY:  Dg Chest 2 View  Result Date: 03/28/2018 CLINICAL DATA:  Fever EXAM: CHEST - 2 VIEW COMPARISON:  03/27/2018 FINDINGS: Large lung volumes with flat diaphragm. Chronic nodular  opacity at the right base, reference chest CT 06/01/2017. There is no edema, consolidation, effusion, or pneumothorax. Normal heart size and stable aortic tortuosity. IMPRESSION: No acute finding.  Stable from priors. Electronically Signed   By: Monte Fantasia M.D.   On: 03/28/2018 13:45   Dg Chest 2 View  Result Date: 03/27/2018 CLINICAL DATA:  Chest pain. EXAM: CHEST - 2 VIEW COMPARISON:  Radiographs of August 02, 2017. FINDINGS: The heart size and mediastinal contours are within normal limits. Both lungs are clear. No pneumothorax or pleural effusion is noted. The visualized skeletal structures are unremarkable. IMPRESSION: No active cardiopulmonary disease. Electronically Signed   By: Marijo Conception, M.D.   On: 03/27/2018 20:09   US Abdomen Limited Ruq  Result Date: 03/29/2018 CLINICAL DATA:  Fever and abnormal liver enzymes EXAM: ULTRASOUND ABDOMEN LIMITED RIGHT UPPER QUADRANT COMPARISON:  PET-CT July 17, 2011 FINDINGS: Gallbladder: Within the gallbladder, there is a 4 mm echogenic focus which moves and shadows consistent with cholelithiasis. No gallbladder wall  thickening or pericholecystic fluid. No sonographic Murphy sign noted by sonographer. Common bile duct: Diameter: 3 mm. No intrahepatic or extrahepatic biliary duct dilatation. Liver: No focal lesion identified. Within normal limits in parenchymal echogenicity. Portal vein is patent on color Doppler imaging with normal direction of blood flow towards the liver. IMPRESSION: 4 mm gallstone within the gallbladder. No gallbladder wall thickening or pericholecystic fluid. Study otherwise unremarkable. Electronically Signed   By: Lowella Grip III M.D.   On: 03/29/2018 08:51    Management plans discussed with the patient, family and they are in agreement.  CODE STATUS:     Code Status Orders  (From admission, onward)        Start     Ordered   03/27/18 2154  Full code  Continuous     03/27/18 2153    Code Status History    Date Active Date Inactive Code Status Order ID Comments User Context   05/09/2017 1455 05/11/2017 2000 Full Code [id number]  Idelle Crouch, MD Inpatient   01/06/2016 2330 01/07/2016 1918 Full Code [id number]  Lance Coon, MD Inpatient      TOTAL TIME TAKING CARE OF THIS PATIENT: 34 minutes.    Loletha Grayer M.D on 03/29/2018 at 3:19 PM  Between 7am to 6pm - Pager - [phone]  After 6pm go to [email or web address] - password [email or web address]  [facility]  [phone]  CC: Primary care physician; Herminio Commons, MD

## 2018-03-29 NOTE — Telephone Encounter (Signed)
Lucienne Minks F  P [facility] Triage        4-9 9:20  Dr. Rockey Situ    TCM call.  Currently admitted

## 2018-03-29 NOTE — Telephone Encounter (Signed)
-----   Message from Blain Pais sent at 03/29/2018  9:41 AM EDT ----- Regarding: tcm/ph 4-9 9:20 Dr. Rockey Situ

## 2018-03-29 NOTE — Telephone Encounter (Signed)
Left voicemail message to call back regarding appointments.

## 2018-03-29 NOTE — Progress Notes (Signed)
Pt discharged to home. Heart monitor and IV removed. Discharge instructions and education reviewed with pt and wife.

## 2018-03-30 LAB — URINE CULTURE

## 2018-03-31 NOTE — Telephone Encounter (Signed)
No answer. Left message to call back.   

## 2018-04-01 NOTE — Telephone Encounter (Signed)
First number not patient's number anymore. Woman who answered asked to please remove the number. Tried the other number listed and it said the number was restricted at this time and there was no voicemail available.

## 2018-04-02 IMAGING — CR DG ELBOW COMPLETE 3+V*R*
1 series · 4 of 4 positions shown · non-contrast
Comparison: None.

CLINICAL DATA: No known injury, pain radiating up and down the
entire arm.

EXAM:
RIGHT ELBOW - COMPLETE 3+ VIEW

[Series 1: dg elbow complete right (3+view) · 0.14mm/px · 4 of 4 slices shown]
[im 1/4]
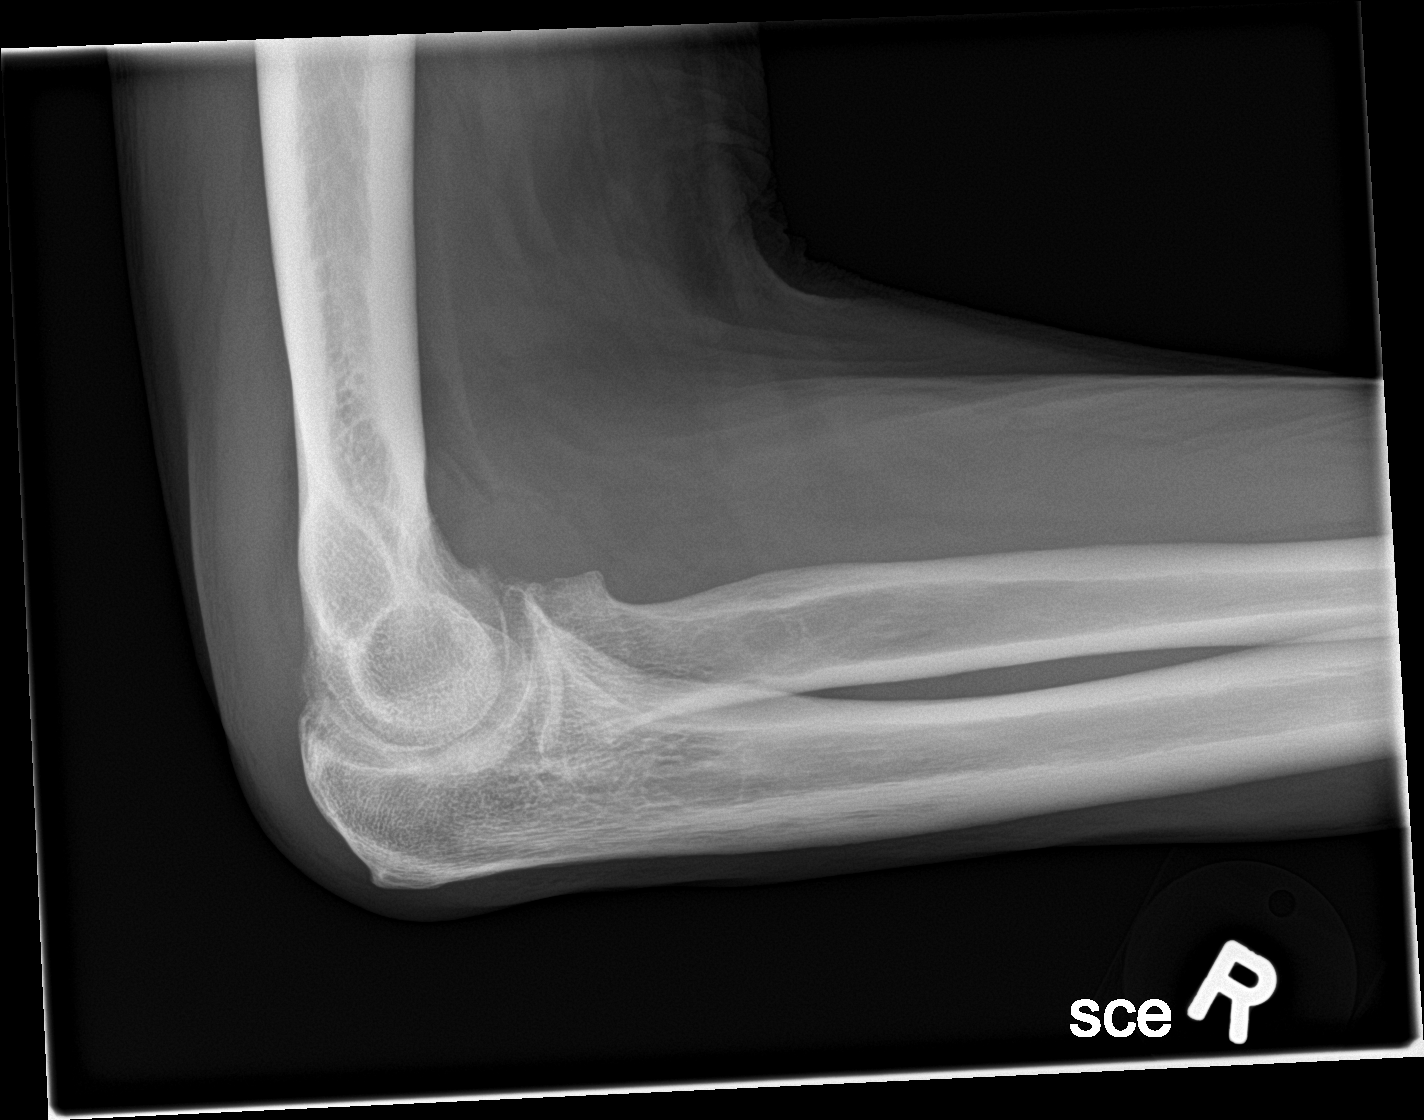
[im 2/4]
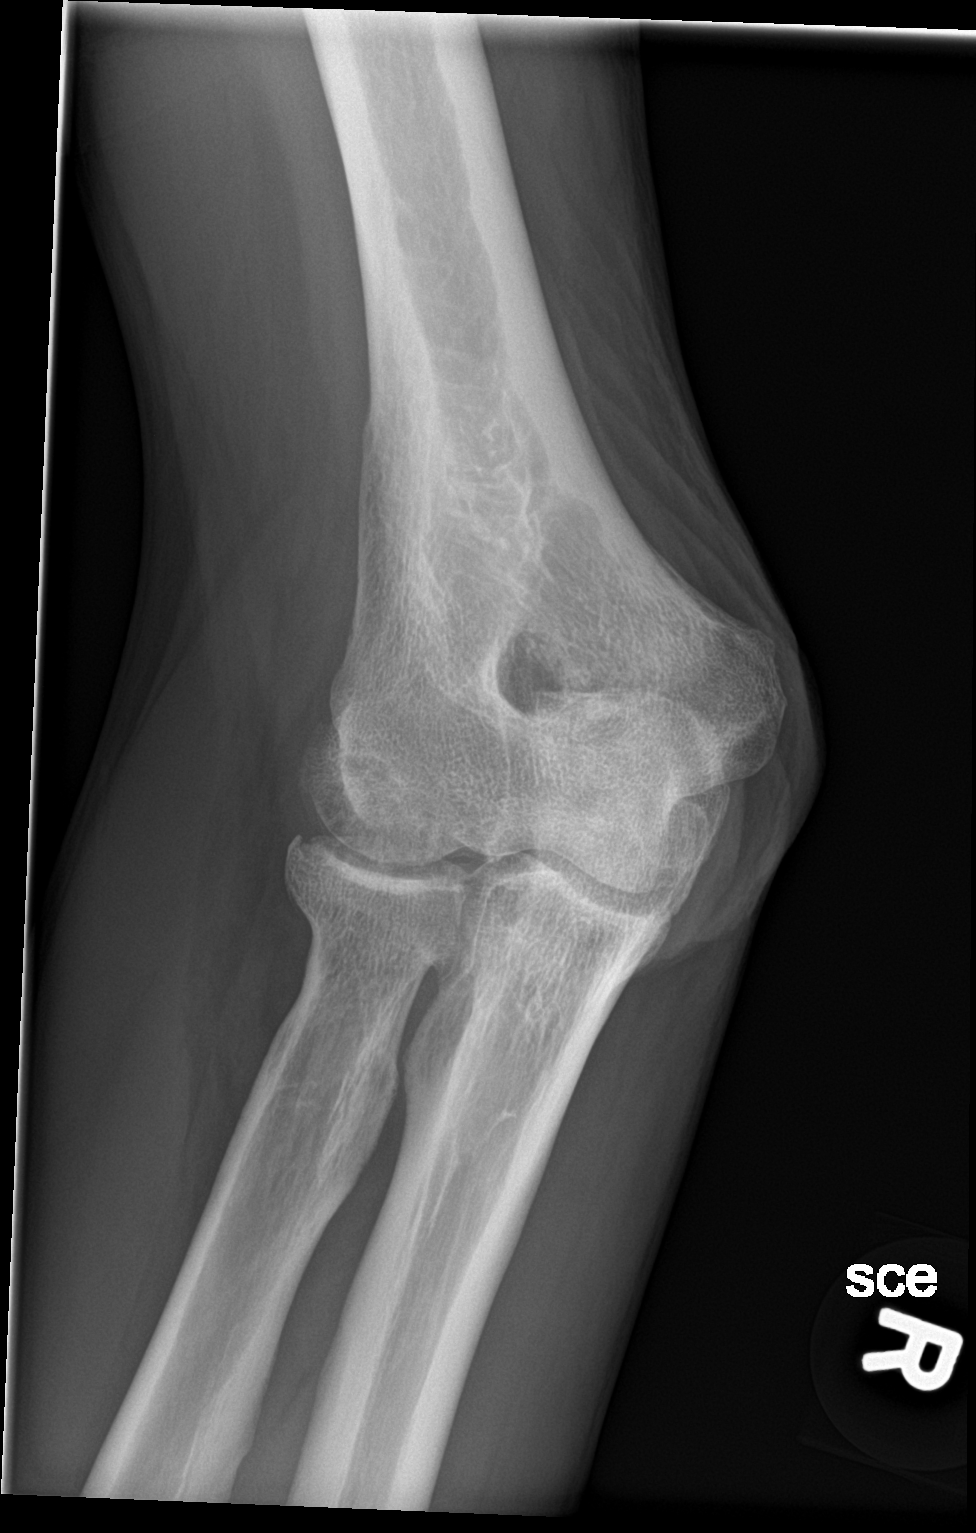
[im 3/4]
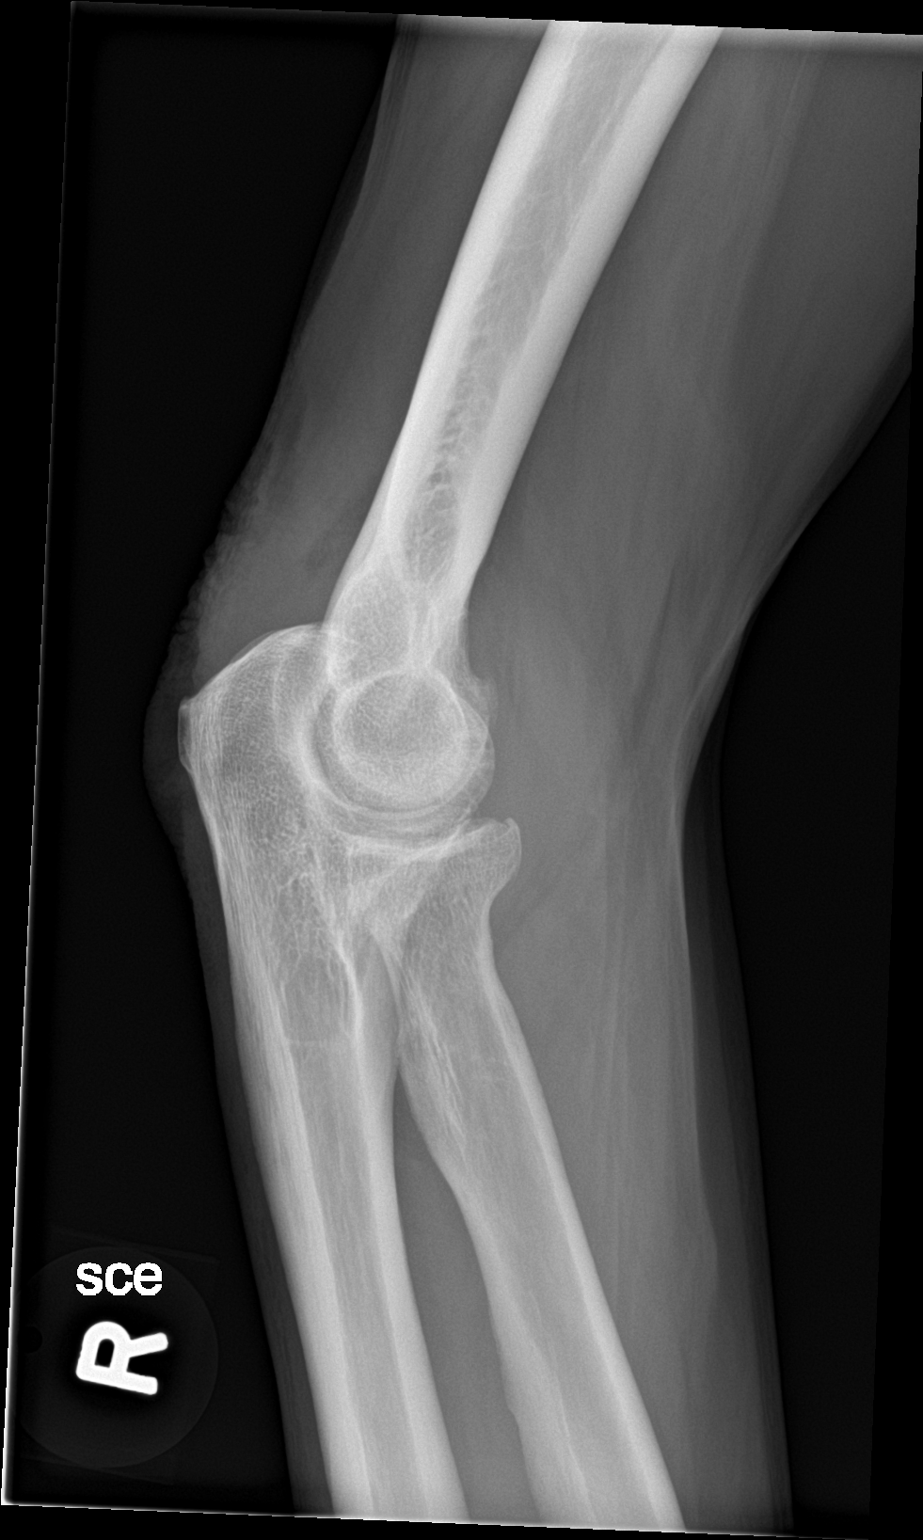
[im 4/4]
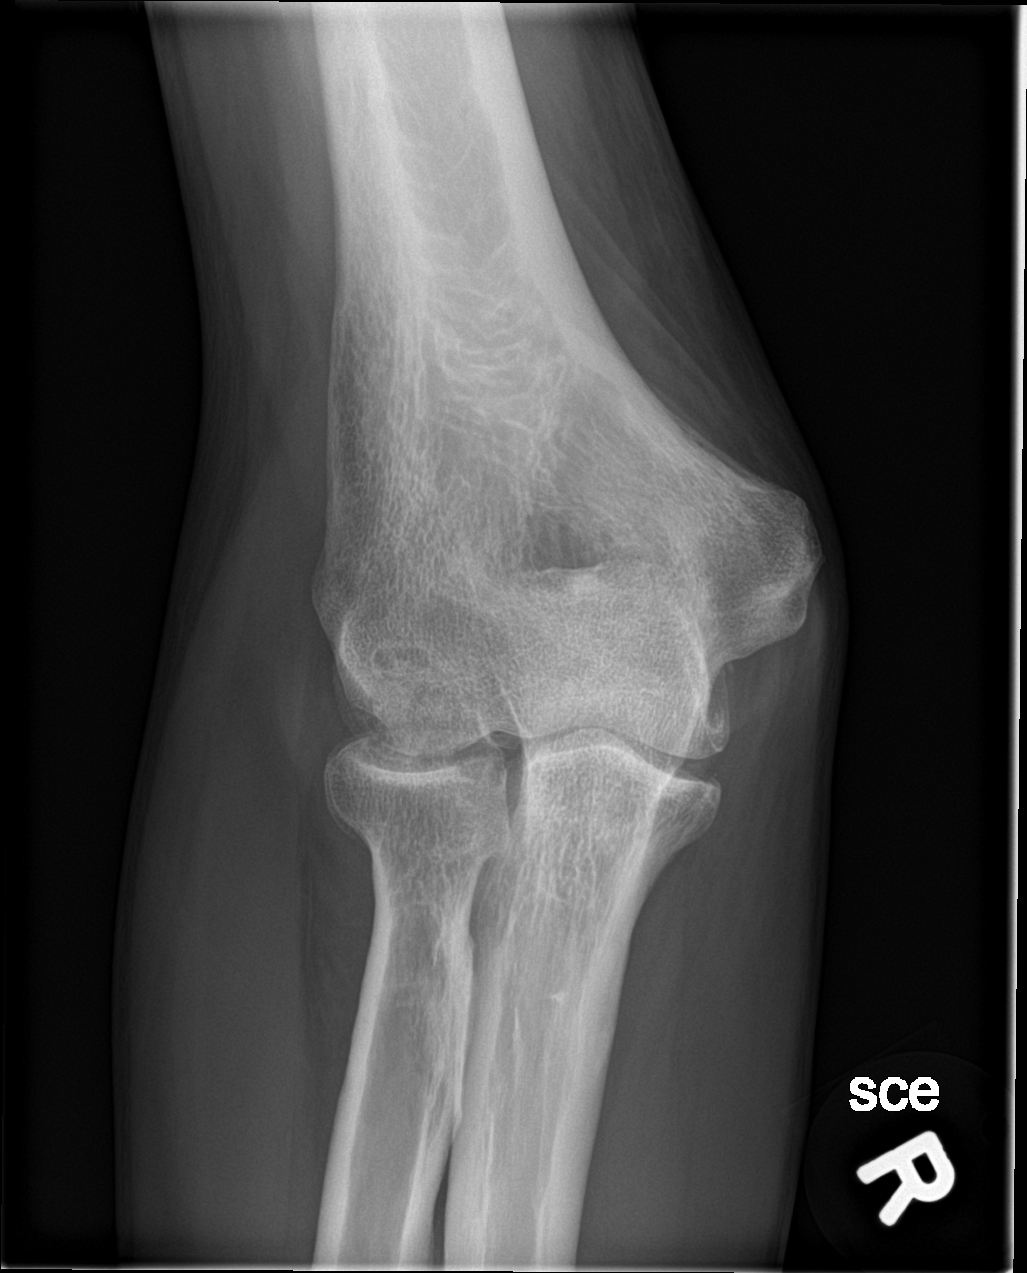

[4 of 4 positions shown; findings below may reference images not displayed]

FINDINGS: No acute fracture or dislocation. Mild osteoarthritis of the radio
capitellar joint and ulnohumeral joint. Small joint effusion.
Possible small marginal erosion of the right radial head. No
aggressive lytic or sclerotic osseous lesion. Soft tissues are
unremarkable.
IMPRESSION: Mild osteoarthritis of the radio capitellar joint and ulnohumeral
joint.

Possible small marginal erosion of the right radial head as can be
seen with an inflammatory or crystalline arthropathy.

Small joint effusion.

## 2018-04-03 NOTE — Progress Notes (Deleted)
Cardiology Office Note  Date:  04/03/2018   ID:  Troy Shepherd, DOB Mar 20, 1946, MRN [id number]  PCP:  Herminio Commons, MD   No chief complaint on file.   HPI:  [name] is a 72 yo with history of  HTN Smoking ETOH, DUI Seropositive rheumatoid arthritis CHRONIC multiple joints pain, recent right knee effusion, swelling bilateral ankles, Chronic kidney disease (CKD), stage III (moderate)  Admission to hospital 02/2018  Complained of CP  in setting of DUI   The patient has no known cardiac problem.   Yesterday he says he was driving when he developed mild chest pressure    Last for a little while then went away   He has never had before.   Denies SOB   No reflux    Discomfort was not pleuritic or positional     Echo 05/10/2018 Normal study  CT chest 05/2017  Stress test 2012   PMH:   has a past medical history of Alcohol abuse, Arthritis, Hypertension, and Leukopenia.  PSH:    Past Surgical History:  Procedure Laterality Date  . APPENDECTOMY    . COLONOSCOPY N/A 11/09/2015   Procedure: COLONOSCOPY;  Surgeon: Josefine Class, MD;  Location: [facility] ENDOSCOPY;  Service: Endoscopy;  Laterality: N/A;    Current Outpatient Medications  Medication Sig Dispense Refill  . amLODipine (NORVASC) 5 MG tablet Take 1 tablet (5 mg total) by mouth 2 (two) times daily. 60 tablet 0  . aspirin EC 81 MG EC tablet Take 1 tablet (81 mg total) by mouth daily. 30 tablet 0  . folic acid (FOLVITE) 1 MG tablet Take 1 tablet (1 mg total) by mouth daily. 30 tablet 0  . levofloxacin (LEVAQUIN) 750 MG tablet Take 1 tablet (750 mg total) by mouth daily. 6 tablet 0  . metoprolol tartrate (LOPRESSOR) 25 MG tablet Take 1 tablet (25 mg total) by mouth 2 (two) times daily. 60 tablet 0  . Multiple Vitamin (MULTIVITAMIN WITH MINERALS) TABS tablet Take 1 tablet by mouth daily. 30 tablet 0  . thiamine 100 MG tablet Take 1 tablet (100 mg total) by mouth daily. 30 tablet 0   No current facility-administered  medications for this visit.      Allergies:   Patient has no known allergies.   Social History:  The patient  reports that he has been smoking cigarettes.  He has a 4.00 pack-year smoking history. He has never used smokeless tobacco. He reports that he drinks about 1.8 oz of alcohol per week. He reports that he does not use drugs.   Family History:   family history includes COPD in his sister.    Review of Systems: ROS   PHYSICAL EXAM: VS:  There were no vitals taken for this visit. , BMI There is no height or weight on file to calculate BMI. GEN: Well nourished, well developed, in no acute distress HEENT: normal Neck: no JVD, carotid bruits, or masses Cardiac: RRR; no murmurs, rubs, or gallops,no edema  Respiratory:  clear to auscultation bilaterally, normal work of breathing GI: soft, nontender, nondistended, + BS MS: no deformity or atrophy Skin: warm and dry, no rash Neuro:  Strength and sensation are intact Psych: euthymic mood, full affect    Recent Labs: 08/02/2017: B Natriuretic Peptide 24.0 03/28/2018: ALT 7; BUN 15; Creatinine, Ser 1.10; Hemoglobin 12.0; Platelets 176; Potassium 4.1; Sodium 137    Lipid Panel No results found for: CHOL, HDL, LDLCALC, TRIG    Wt Readings from Last 3  Encounters:  03/29/18 120 lb 4.8 oz (54.6 kg)  08/02/17 115 lb (52.2 kg)  06/20/17 120 lb (54.4 kg)       ASSESSMENT AND PLAN:  No diagnosis found.   Disposition:   F/U  6 months  No orders of the defined types were placed in this encounter.    Signed, Esmond Plants, M.D., Ph.D. 04/03/2018  [place], [place]

## 2018-04-06 ENCOUNTER — Ambulatory Visit: Payer: Medicare HMO | Admitting: Cardiovascular Disease

## 2018-04-07 ENCOUNTER — Encounter: Payer: Self-pay | Admitting: Cardiovascular Disease

## 2018-07-05 ENCOUNTER — Encounter: Payer: Self-pay | Admitting: Emergency Medicine

## 2018-07-05 ENCOUNTER — Emergency Department: Payer: Medicare HMO

## 2018-07-05 ENCOUNTER — Emergency Department
Admission: EM | Admit: 2018-07-05 | Discharge: 2018-07-05 | Disposition: A | Discharge status: Home / Self Care | Payer: Medicare HMO | Attending: Emergency Medicine | Admitting: Emergency Medicine

## 2018-07-05 ENCOUNTER — Other Ambulatory Visit: Payer: Self-pay

## 2018-07-05 DIAGNOSIS — Z79899 Other long term (current) drug therapy: Secondary | ICD-10-CM | POA: Insufficient documentation

## 2018-07-05 DIAGNOSIS — Z7982 Long term (current) use of aspirin: Secondary | ICD-10-CM | POA: Insufficient documentation

## 2018-07-05 DIAGNOSIS — I10 Essential (primary) hypertension: Secondary | ICD-10-CM | POA: Insufficient documentation

## 2018-07-05 DIAGNOSIS — M7121 Synovial cyst of popliteal space [Baker], right knee: Secondary | ICD-10-CM

## 2018-07-05 DIAGNOSIS — R2241 Localized swelling, mass and lump, right lower limb: Secondary | ICD-10-CM | POA: Diagnosis present

## 2018-07-05 DIAGNOSIS — F17210 Nicotine dependence, cigarettes, uncomplicated: Secondary | ICD-10-CM | POA: Diagnosis not present

## 2018-07-05 LAB — CBC WITH DIFFERENTIAL/PLATELET
BASOS PCT: 1 %
Basophils Absolute: 0 10*3/uL (ref 0–0.1)
EOS ABS: 0 10*3/uL (ref 0–0.7)
Eosinophils Relative: 1 %
HCT: 32.2 % — ABNORMAL LOW (ref 40.0–52.0)
Hemoglobin: 11 g/dL — ABNORMAL LOW (ref 13.0–18.0)
LYMPHS ABS: 0.6 10*3/uL — AB (ref 1.0–3.6)
Lymphocytes Relative: 13 %
MCH: 34.4 pg — AB (ref 26.0–34.0)
MCHC: 34.2 g/dL (ref 32.0–36.0)
MCV: 100.7 fL — AB (ref 80.0–100.0)
Monocytes Absolute: 0.6 10*3/uL (ref 0.2–1.0)
Monocytes Relative: 13 %
NEUTROS ABS: 3.5 10*3/uL (ref 1.4–6.5)
NEUTROS PCT: 72 %
Platelets: 451 10*3/uL — ABNORMAL HIGH (ref 150–440)
RBC: 3.2 MIL/uL — AB (ref 4.40–5.90)
RDW: 14.6 % — AB (ref 11.5–14.5)
WBC: 4.8 10*3/uL (ref 3.8–10.6)

## 2018-07-05 LAB — BASIC METABOLIC PANEL
Anion gap: 10 (ref 5–15)
BUN: 17 mg/dL (ref 8–23)
CALCIUM: 9.2 mg/dL (ref 8.9–10.3)
CO2: 27 mmol/L (ref 22–32)
CREATININE: 1.12 mg/dL (ref 0.61–1.24)
Chloride: 100 mmol/L (ref 98–111)
GFR calc Af Amer: >60 mL/min (ref >60)
GFR calc non Af Amer: >60 mL/min (ref >60)
GLUCOSE: 93 mg/dL (ref 70–99)
Potassium: 4 mmol/L (ref 3.5–5.1)
SODIUM: 137 mmol/L (ref 135–145)

## 2018-07-05 MED ORDER — TRAMADOL HCL 50 MG PO TABS: 50.0000 mg | ORAL_TABLET | Freq: Four times a day (QID) | ORAL | 0 refills | Status: AC | PRN

## 2018-07-05 NOTE — ED Notes (Signed)
Patient to lobby in wheelchair with spouse. Verbalized understanding of discharge instructions and follow-up care.

## 2018-07-05 NOTE — ED Triage Notes (Signed)
Patient from home via [facility]. Patient reports increasing swelling to right knee x2 weeks. Reports history of arthritis and was recently taking off of medications. Denies injury to knee. Patient states he has been working on his feet without issue. Patient reports pain to back of knee with palpation. Denies fever.

## 2018-07-05 NOTE — ED Provider Notes (Addendum)
[facility] Emergency Department Provider Note  ____________________________________________   I have reviewed the triage vital signs and the nursing notes. Where available I have reviewed prior notes and, if possible and indicated, outside hospital notes.    HISTORY  Chief Complaint Joint Swelling    HPI Troy Shepherd is a 72 y.o. male with a history of arthritis, EtOH abuse, hypertension, who no longer drinks alcohol according to notes presents today complaining of right knee swelling for last 2 weeks or so.  Patient states it hurts to touch and ambulate.  He denies any trauma.  He has also pain in the proximal right calf.  Has been going on for the same amount of time.  The knee itself does not hurt.  He can bend and extend it with no difficulty.  He states is been getting worse since they took him off his arthritis medication but he cannot remember what that was.  He denies any numbness or weakness, denies any other complaints or symptoms. Is worse when he walks on a nothing makes it better no other being or aggravating symptoms and not taken anything for the pain.   Past Medical History:  Diagnosis Date  . Alcohol abuse    quit 09/2014  . Arthritis   . Hypertension   . Leukopenia    resolved when stopped drinking 09/2014    Patient Active Problem List   Diagnosis Date Noted  . Chest pain 03/27/2018  . Abnormal EKG 03/27/2018  . Anemia 03/27/2018  . Acute viral syndrome 05/09/2017  . Tick bite of groin 05/09/2017  . Generalized weakness 05/09/2017  . Ambulatory dysfunction 05/09/2017  . Alcohol dependence ([place]) 03/05/2017  . Sepsis ([place]) 01/06/2016  . Hypokalemia 01/06/2016  . HTN (hypertension) 01/06/2016  . Nausea and vomiting 01/06/2016  . Dehydration 01/06/2016    Past Surgical History:  Procedure Laterality Date  . APPENDECTOMY    . COLONOSCOPY N/A 11/09/2015   Procedure: COLONOSCOPY;  Surgeon: Josefine Class, MD;  Location: [facility]  ENDOSCOPY;  Service: Endoscopy;  Laterality: N/A;    Prior to Admission medications   Medication Sig Start Date End Date Taking? Authorizing Provider  amLODipine (NORVASC) 5 MG tablet Take 1 tablet (5 mg total) by mouth 2 (two) times daily. 03/29/18   Loletha Grayer, MD  aspirin EC 81 MG EC tablet Take 1 tablet (81 mg total) by mouth daily. 03/29/18   Loletha Grayer, MD  folic acid (FOLVITE) 1 MG tablet Take 1 tablet (1 mg total) by mouth daily. 03/29/18   Loletha Grayer, MD  levofloxacin (LEVAQUIN) 750 MG tablet Take 1 tablet (750 mg total) by mouth daily. 03/29/18   Loletha Grayer, MD  metoprolol tartrate (LOPRESSOR) 25 MG tablet Take 1 tablet (25 mg total) by mouth 2 (two) times daily. 03/29/18   Loletha Grayer, MD  Multiple Vitamin (MULTIVITAMIN WITH MINERALS) TABS tablet Take 1 tablet by mouth daily. 03/29/18   Loletha Grayer, MD  thiamine 100 MG tablet Take 1 tablet (100 mg total) by mouth daily. 03/29/18   Loletha Grayer, MD    Allergies Patient has no known allergies.  Family History  Problem Relation Age of Onset  . COPD Sister     Social History Social History   Tobacco Use  . Smoking status: Current Every Day Smoker    Packs/day: 0.10    Years: 40.00    Pack years: 4.00    Types: Cigarettes  . Smokeless tobacco: Never Used  Substance Use Topics  .  Alcohol use: Yes    Alcohol/week: 1.8 oz    Types: 3 Shots of liquor per week    Comment: per pt drinks twice/d 5 days a wk  . Drug use: No    Review of Systems Constitutional: No fever/chills Eyes: No visual changes. ENT: No sore throat. No stiff neck no neck pain Cardiovascular: Denies chest pain. Respiratory: Denies shortness of breath. Gastrointestinal:   no vomiting.  No diarrhea.  No constipation. Genitourinary: Negative for dysuria. Musculoskeletal: Negative lower extremity swelling Skin: Negative for rash. Neurological: Negative for severe headaches, focal weakness or  numbness.   ____________________________________________   PHYSICAL EXAM:  VITAL SIGNS: ED Triage Vitals  Enc Vitals Group     BP 07/05/18 1431 (!) 145/79     Pulse Rate 07/05/18 1431 83     Resp 07/05/18 1431 16     Temp 07/05/18 1431 99.2 F (37.3 C)     Temp Source 07/05/18 1431 Oral     SpO2 07/05/18 1431 100 %     Weight 07/05/18 1430 130 lb (59 kg)     Height 07/05/18 1430 5\' 6"  (1.676 m)     Head Circumference --      Peak Flow --      Pain Score 07/05/18 1431 8     Pain Loc --      Pain Edu? --      Excl. in [place]? --     Constitutional: Alert and oriented. Well appearing and in no acute distress. Eyes: Conjunctivae are normal Head: Atraumatic HEENT: No congestion/rhinnorhea. Mucous membranes are moist.  Oropharynx non-erythematous Neck:   Nontender with no meningismus, no masses, no stridor Cardiovascular: Normal rate, regular rhythm. Grossly normal heart sounds.  Good peripheral circulation. Respiratory: Normal respiratory effort.  No retractions. Lungs CTAB. Abdominal: Soft and nontender. No distention. No guarding no rebound Back:  There is no focal tenderness or step off.  there is no midline tenderness there are no lesions noted. there is no CVA tenderness Musculoskeletal: There is swelling noted to the prepatellar region principally around the knee, I do not detect an effusion in the knee itself.  He can flex and extend the knee with no evidence of discomfort.  There are strong distal pulses.  He has also pain to the proximal calf noted.  No joint effusions, no DVT signs strong distal pulses no edema Neurologic:  Normal speech and language. No gross focal neurologic deficits are appreciated.  Skin:  Skin is warm, dry and intact. No rash noted. Psychiatric: Mood and affect are normal. Speech and behavior are normal.  ____________________________________________   LABS (all labs ordered are listed, but only abnormal results are displayed)  Labs Reviewed  CBC  WITH DIFFERENTIAL/PLATELET - Abnormal; Notable for the following components:      Result Value   RBC 3.20 (*)    Hemoglobin 11.0 (*)    HCT 32.2 (*)    MCV 100.7 (*)    MCH 34.4 (*)    RDW 14.6 (*)    Platelets 451 (*)    Lymphs Abs 0.6 (*)    All other components within normal limits  BASIC METABOLIC PANEL    Pertinent labs  results that were available during my care of the patient were reviewed by me and considered in my medical decision making (see chart for details). ____________________________________________  EKG  I personally interpreted any EKGs ordered by me or triage  ____________________________________________  RADIOLOGY  Pertinent labs & imaging results that  were available during my care of the patient were reviewed by me and considered in my medical decision making (see chart for details). If possible, patient and/or family made aware of any abnormal findings.  No results found. ____________________________________________    PROCEDURES  Procedure(s) performed: None  Procedures  Critical Care performed: None  ____________________________________________   INITIAL IMPRESSION / ASSESSMENT AND PLAN / ED COURSE  Pertinent labs & imaging results that were available during my care of the patient were reviewed by me and considered in my medical decision making (see chart for details).  Patient here with swelling to the right knee as well as swelling to the right calf.  We will obtain x-ray given possibility of occult injury, will obtain ultrasound to rule out DVT and/or Baker's cyst and will obtain ultrasound to verify that this is a prepatellar bursitis rather than a joint effusion and we will reassess.  ----------------------------------------- 4:58 PM on 07/05/2018 -----------------------------------------  Patient lying in bed with his legs bent, both of them, equally with no evidence of discomfort.  Ultrasound shows Baker's cyst, there is no joint  effusion I do not think tapping it is in the patient's best interest given the risks of tapping the joint with no effusion and clear other etiology for his symptoms.  We will have him in an Ace wrap, I will have him follow closely with orthopedic surgery as an outpatient.  No evidence of infection not hot to touch white count normal no fevers  ----------------------------------------- 5:06 PM on 07/05/2018 -----------------------------------------  Discussed w/ dr. Sabra Heck who agrees w/ mgt and d/c will f/u in a few days.     ____________________________________________   FINAL CLINICAL IMPRESSION(S) / ED DIAGNOSES  Final diagnoses:  None      This chart was dictated using voice recognition software.  Despite best efforts to proofread,  errors can occur which can change meaning.      Schuyler Amor, MD 07/05/18 1521    Schuyler Amor, MD 07/05/18 1659    Schuyler Amor, MD 07/05/18 [phone]

## 2018-07-05 NOTE — Discharge Instructions (Signed)
Keep the area elevated and gently wrapped with ace wrap.  Take over the counter medications as needed. Follow up with orthopedics in a few days as directed.  If you Have increased pain, fever, increased swelling, or you feel worse in any way return to the emergency department

## 2018-10-15 IMAGING — DX DG CHEST 1V
1 series · 2 of 2 positions shown · non-contrast
Comparison: January 06, 2016

CLINICAL DATA: Severe weakness.  Fever.

EXAM:
CHEST 1 VIEW

[Series 1: chest ap · 0.14mm/px · 2 of 2 slices shown]
[im 1/2]
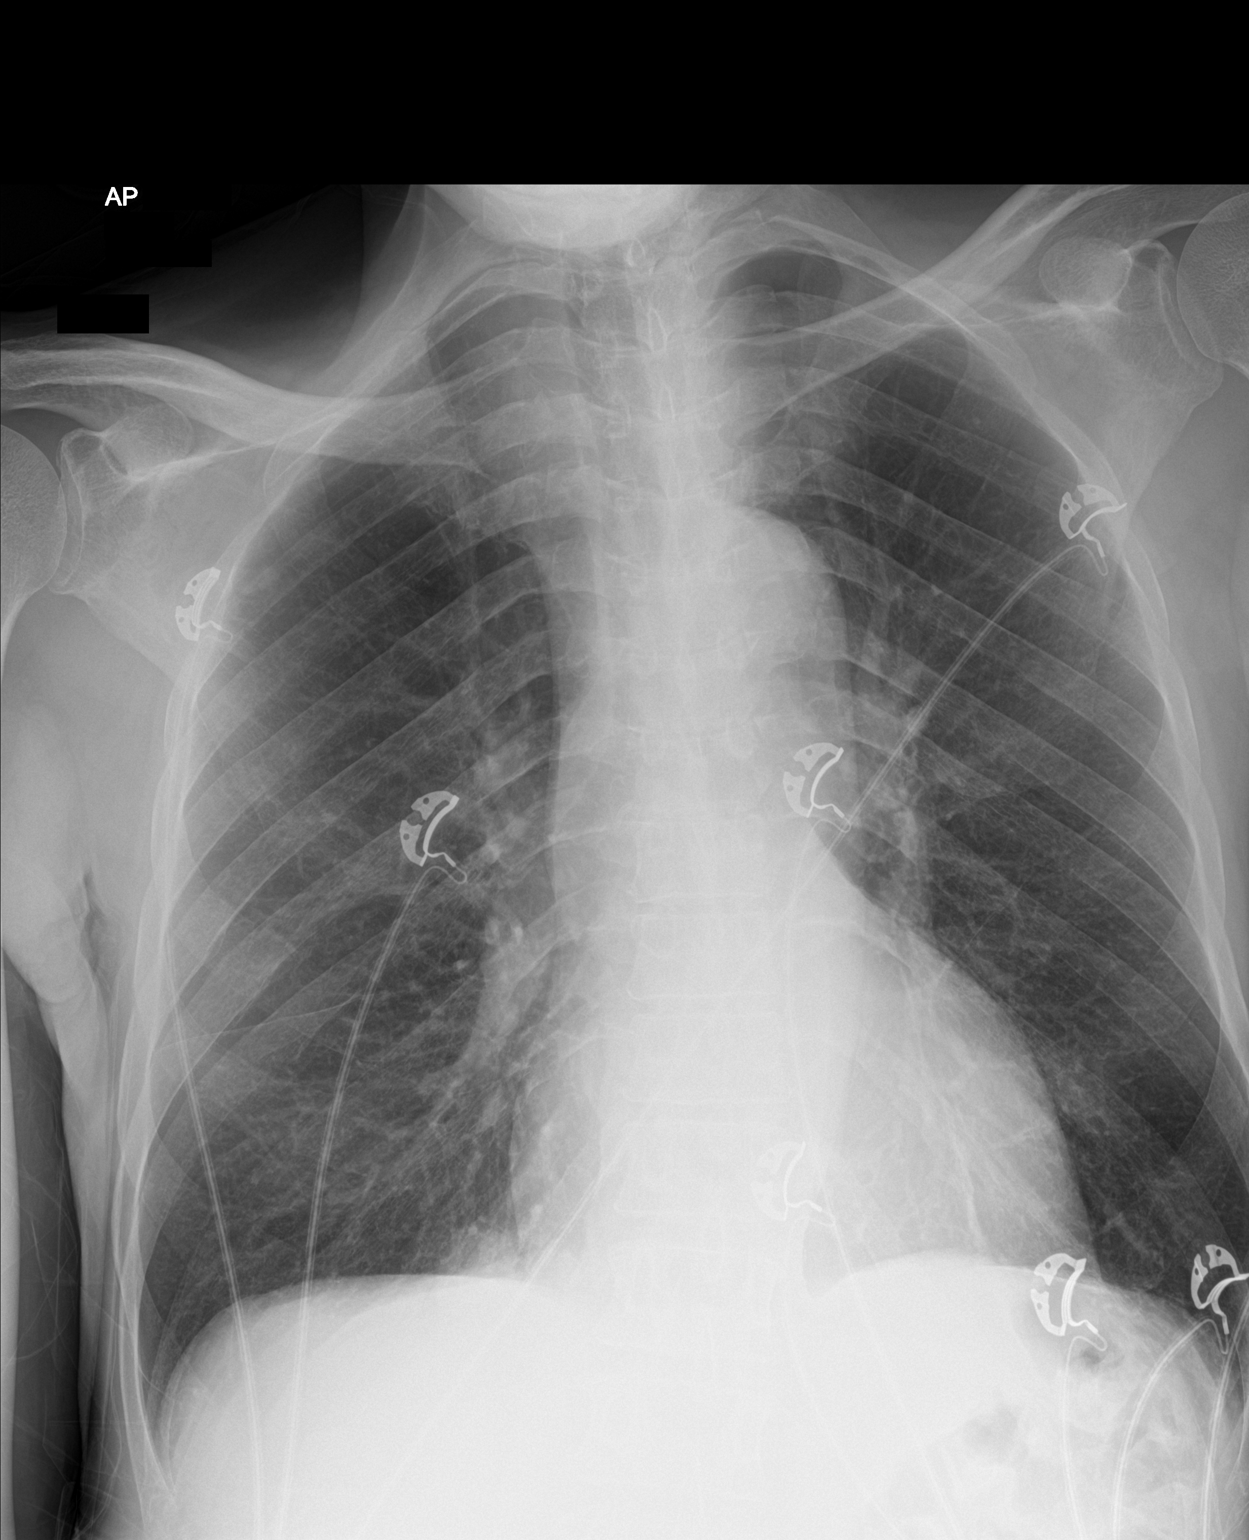
[im 2/2]
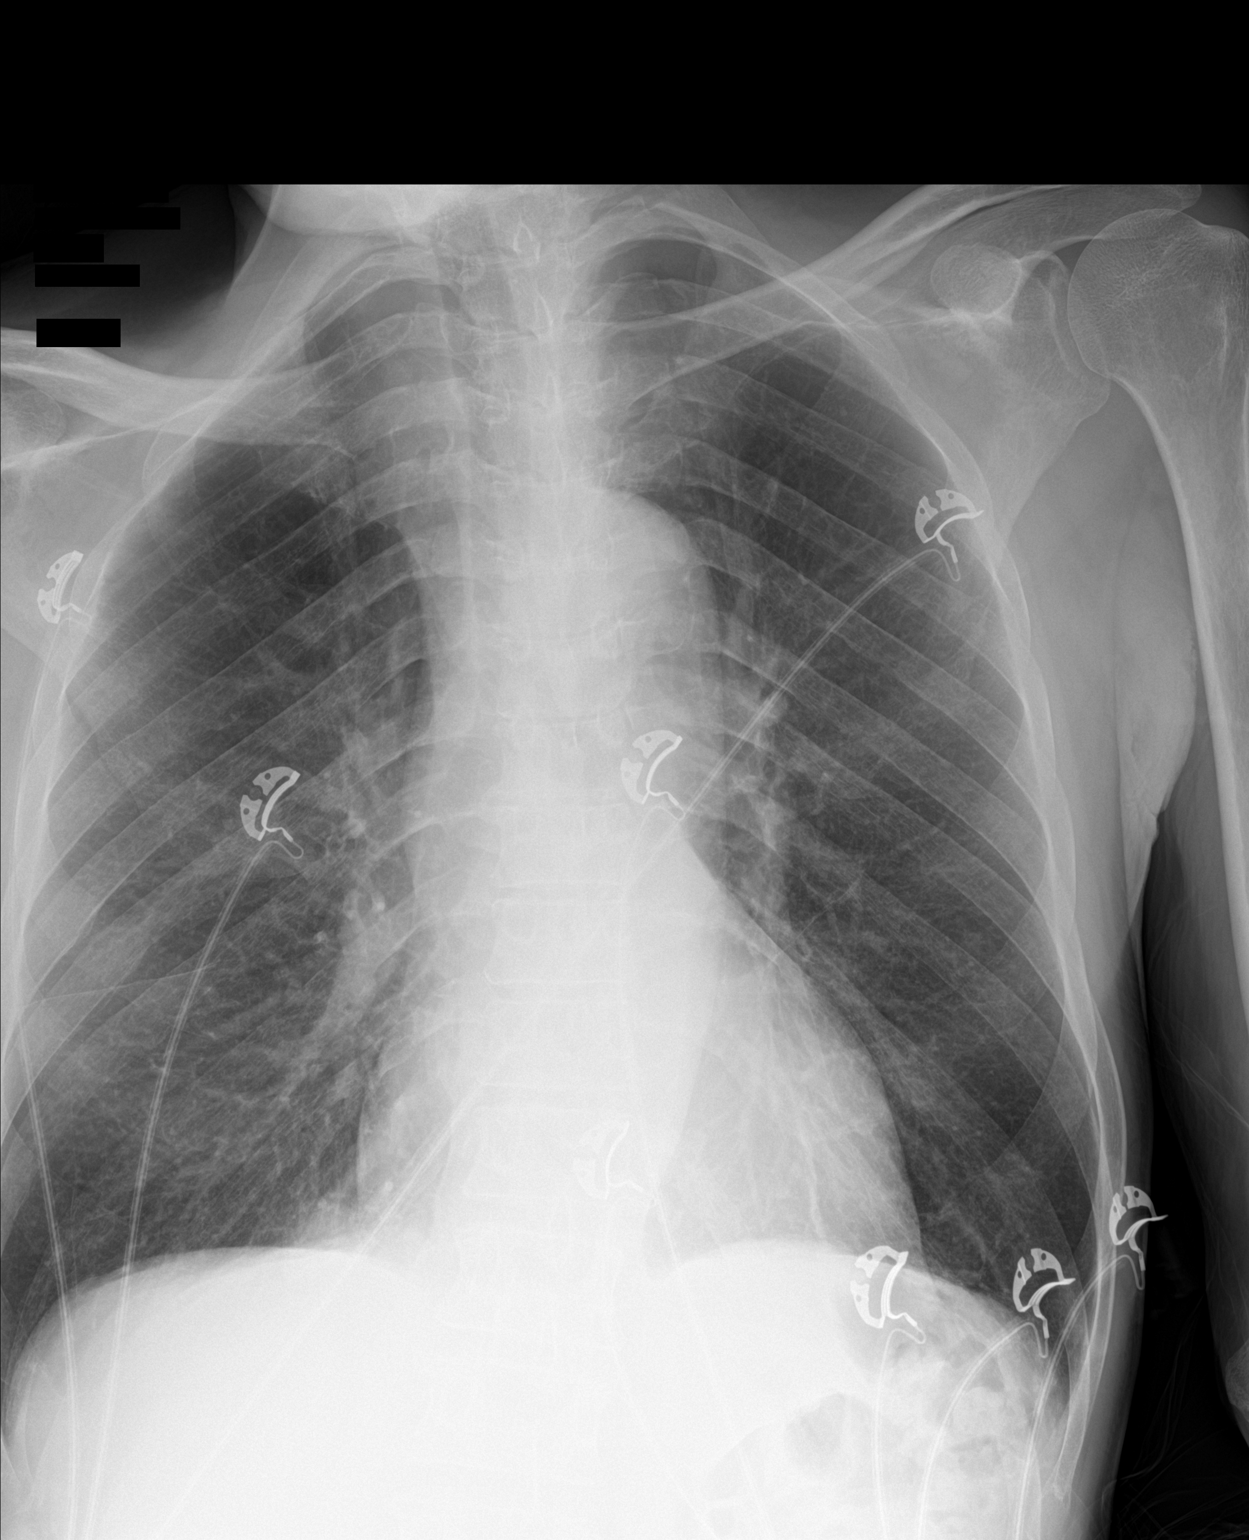

[2 of 2 positions shown; findings below may reference images not displayed]

FINDINGS: The heart size and mediastinal contours are within normal limits.
Both lungs are clear. The visualized skeletal structures are
unremarkable.
IMPRESSION: No active disease.

## 2018-11-07 IMAGING — CT CT CHEST W/O CM
1 series · 14 of 34 positions shown, 18 images · non-contrast
Comparison: Chest CT 07/12/2011.  PET-CT 07/17/2011.

ADDENDUM:
As mentioned in the findings section of the original dictation, but
not discussed in the impression section, there is stable mediastinal
lymphadenopathy compared to the prior study. This is favored to
reflect a systemic disease such as sarcoidosis. Clinical correlation
is suggested.
CLINICAL DATA: 70-year-old male with history of bilateral neck pain
radiating into the chest and down both arms for 1 week. Smoker.

EXAM:
CT CHEST WITHOUT CONTRAST
TECHNIQUE: Multidetector CT imaging of the chest was performed following the
standard protocol without IV contrast.

[Series 2: thorax · axial · 0.65mm/px · z∈[-627,-335]mm · 14 of 172 slices shown, 18 images]
[im 13/172  mediastinal]
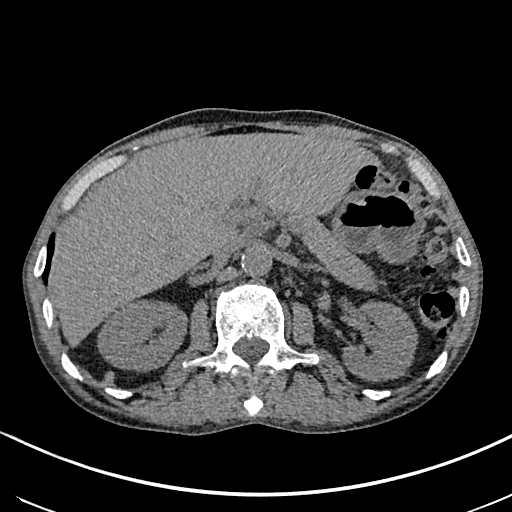
[im 13/172  lung]
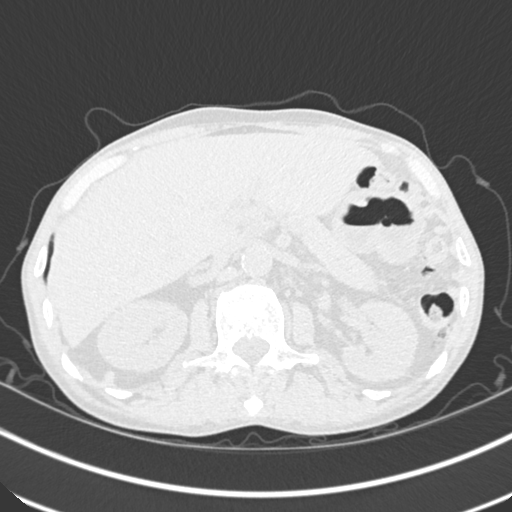
[im 26/172  lung]
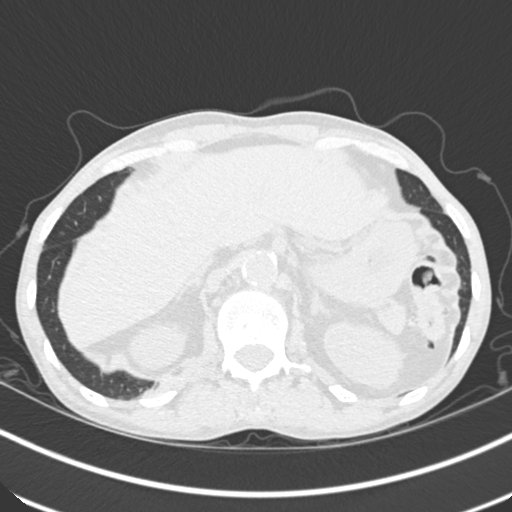
[im 35/172  lung]
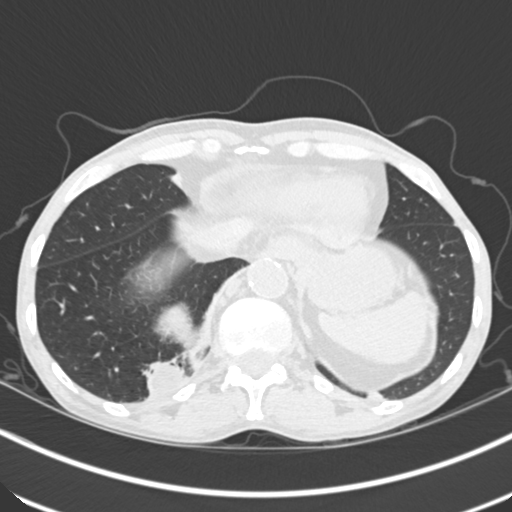
[im 51/172  lung]
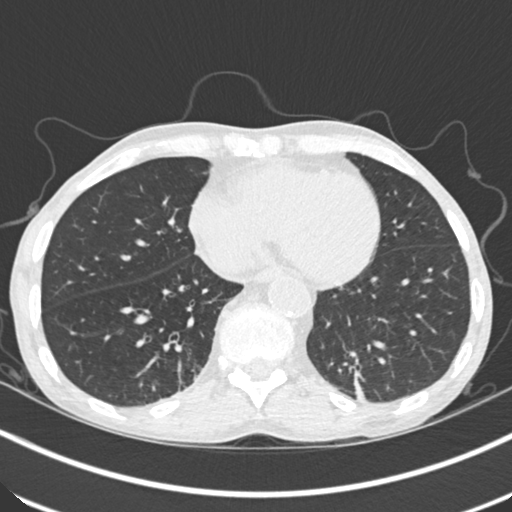
[im 64/172  mediastinal]
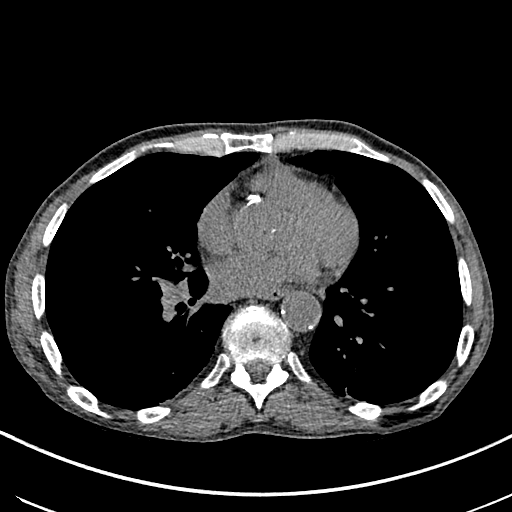
[im 64/172  lung]
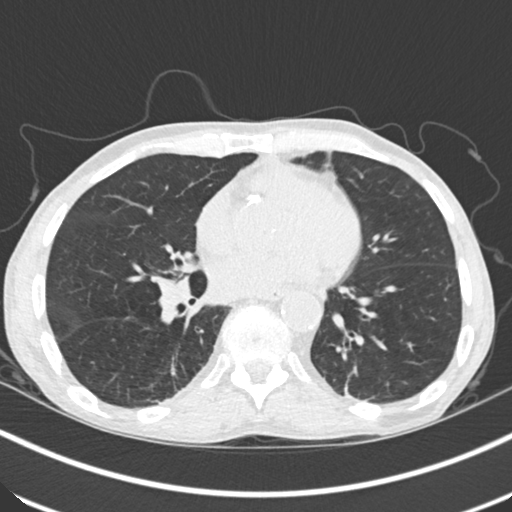
[im 70/172  lung]
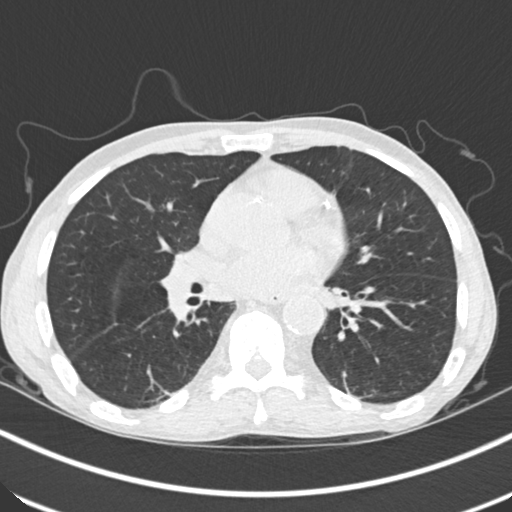
[im 82/172  lung]
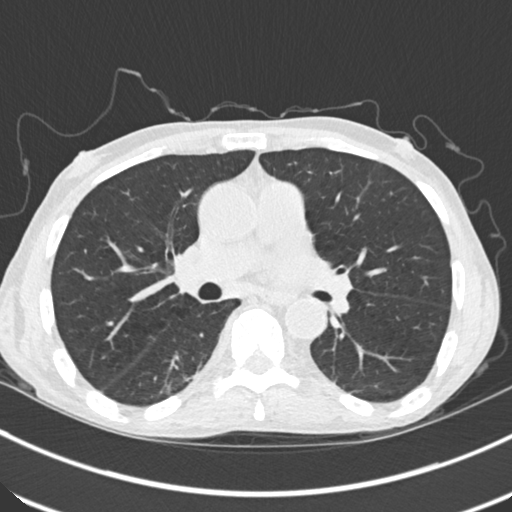
[im 91/172  lung]
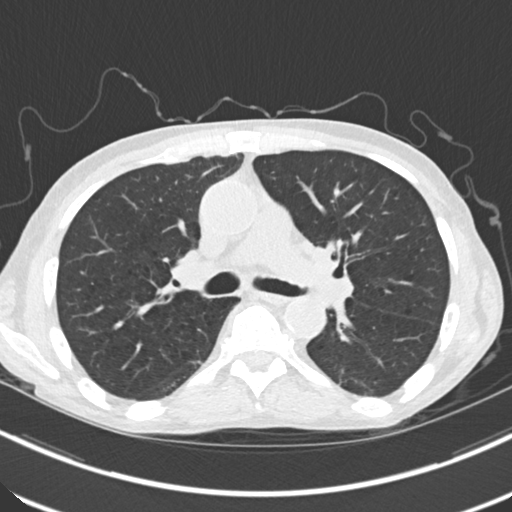
[im 102/172  mediastinal]
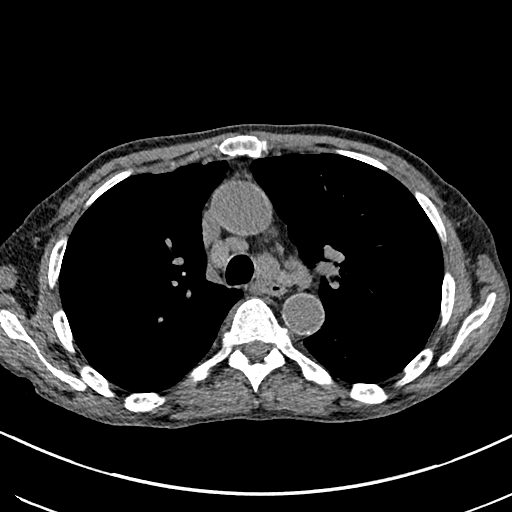
[im 102/172  lung]
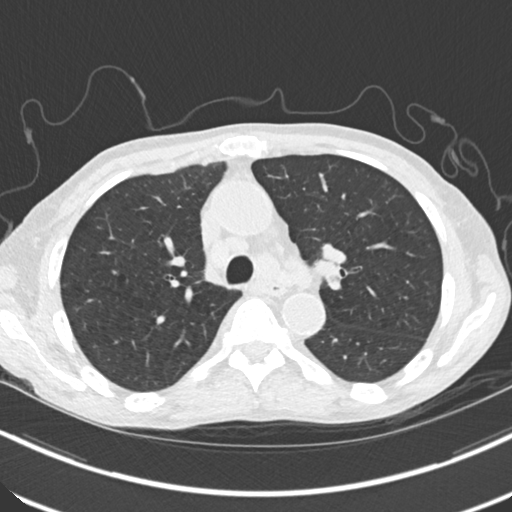
[im 108/172  lung]
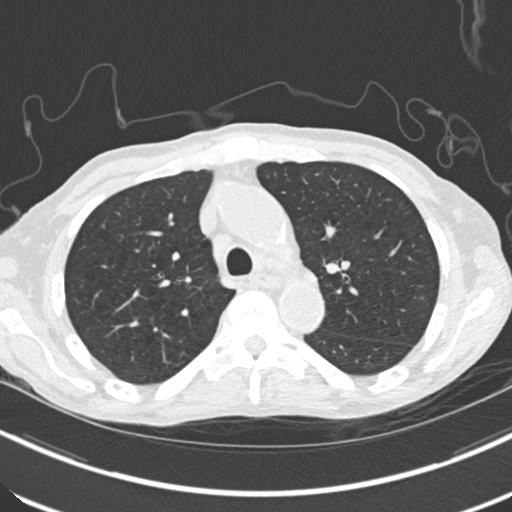
[im 127/172  lung]
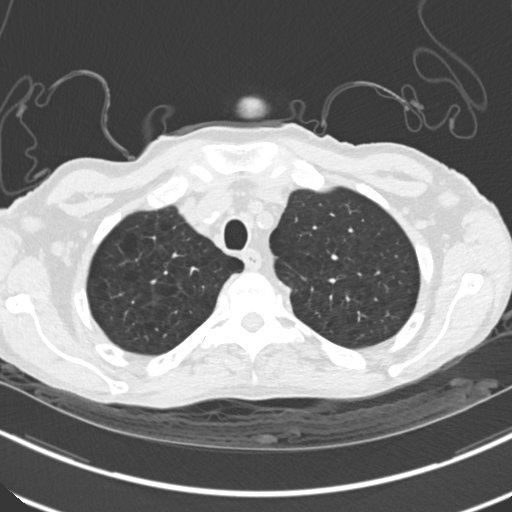
[im 137/172  lung]
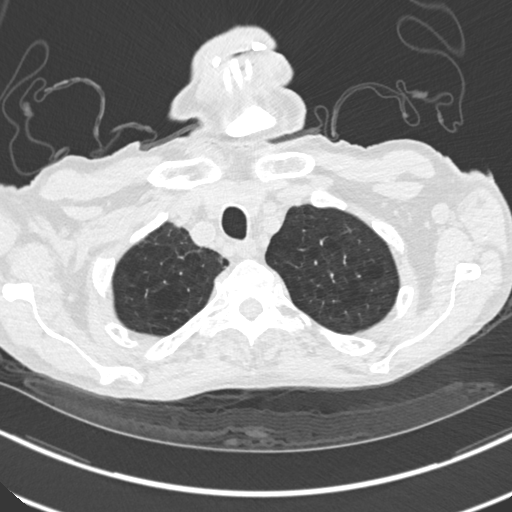
[im 146/172  mediastinal]
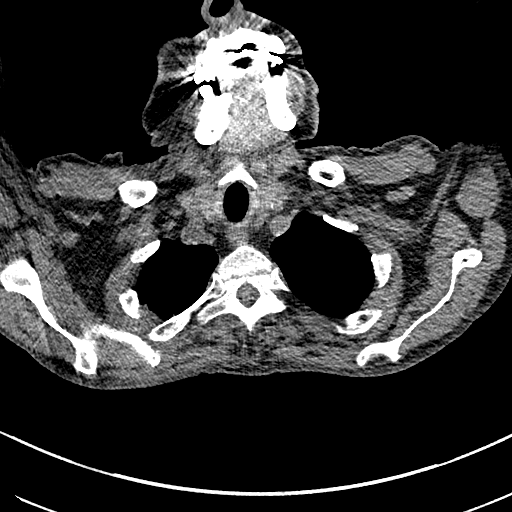
[im 146/172  lung]
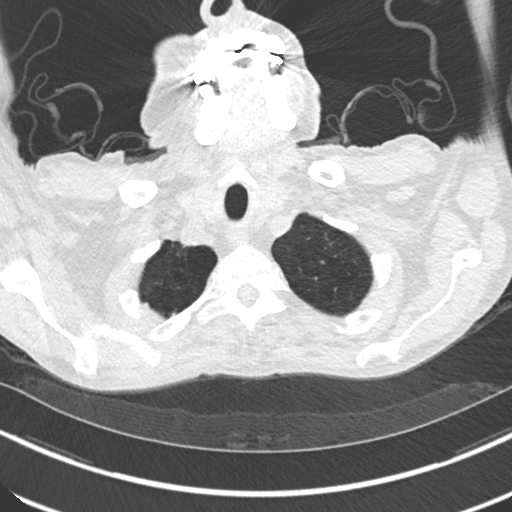
[im 159/172  lung]
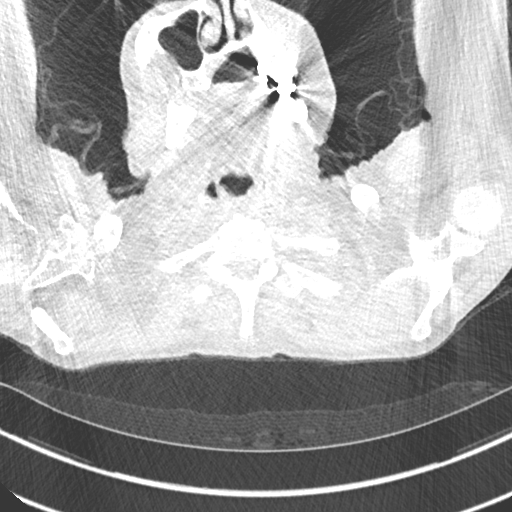

[14 of 34 positions shown; findings below may reference images not displayed]

FINDINGS: Cardiovascular: Heart size is normal. There is no significant
pericardial fluid, thickening or pericardial calcification. There is
aortic atherosclerosis, as well as atherosclerosis of the great
vessels of the mediastinum and the coronary arteries, including
calcified atherosclerotic plaque in the left main, left anterior
descending, left circumflex and right coronary arteries.
Calcifications of the aortic valve (mild).

Mediastinum/Nodes: Multiple borderline enlarged and mildly enlarged
mediastinal and bilateral hilar lymph nodes are similar to the prior
study measuring up to 15 mm in short axis in the low left
paratracheal nodal station. Esophagus is unremarkable in appearance.
No axillary lymphadenopathy.

Lungs/Pleura: Again noted is a well-circumscribed pleural-based
rounded area of low-attenuation (-17 HU) which is stable in
appearance compared to prior study 07/12/2011, compatible with a
benign lesion. The overlying pleura appears minimally thickened.
This appears to displace bronchi around the lesion. 5 mm subpleural
nodule in the anterior aspect of the right lower lobe (axial image
106 of series 3) abutting the major fissure, stable compared to the
prior study, compatible with a benign lesion, likely a subpleural
lymph node. New area of linear scarring in the posterior aspect of
the left lower lobe. No other suspicious appearing pulmonary nodules
or masses are noted. Mild diffuse bronchial wall thickening with
mild to moderate centrilobular and paraseptal emphysema, most
apparent the lung apices. No free-flowing pleural effusions.

Upper Abdomen: 3 mm dystrophic calcification in the interpolar
region of the left kidney. Aortic atherosclerosis. 1.9 cm
intermediate attenuation lesion in segment 3 of the liver (image 172
of series 2) incompletely visualized. Aortic atherosclerosis.

Musculoskeletal: There are no aggressive appearing lytic or blastic
lesions noted in the visualized portions of the skeleton.
IMPRESSION: 1. No acute findings in the thorax to account for the patient's
symptoms.
2. Aortic atherosclerosis, in addition to left main and 2 vessel
coronary artery disease. Assessment for potential risk factor
modification, dietary therapy or pharmacologic therapy may be
warranted, if clinically indicated.
3. Diffuse bronchial wall thickening with mild to moderate
centrilobular and paraseptal emphysema.
4. Benign low-attenuation lesion in the posterior aspect of the
lower right hemithorax. This is favored to represent a small chronic
loculated pleural effusion or other benign lesion.
5. Incompletely visualized intermediate attenuation lesion in
segment 3 of the liver. This is indeterminate, and warrants further
evaluation with nonemergent MRI of the abdomen with and without IV
gadolinium.
6. Additional incidental findings, as above.

## 2019-03-09 ENCOUNTER — Emergency Department: Payer: Medicare HMO

## 2019-03-09 ENCOUNTER — Encounter: Payer: Self-pay | Admitting: Emergency Medicine

## 2019-03-09 ENCOUNTER — Emergency Department
Admission: EM | Admit: 2019-03-09 | Discharge: 2019-03-09 | Disposition: A | Discharge status: Home / Self Care | Payer: Medicare HMO | Attending: Emergency Medicine | Admitting: Emergency Medicine

## 2019-03-09 DIAGNOSIS — M79672 Pain in left foot: Secondary | ICD-10-CM | POA: Diagnosis not present

## 2019-03-09 DIAGNOSIS — I10 Essential (primary) hypertension: Secondary | ICD-10-CM | POA: Insufficient documentation

## 2019-03-09 DIAGNOSIS — Z7982 Long term (current) use of aspirin: Secondary | ICD-10-CM | POA: Diagnosis not present

## 2019-03-09 DIAGNOSIS — F17210 Nicotine dependence, cigarettes, uncomplicated: Secondary | ICD-10-CM | POA: Insufficient documentation

## 2019-03-09 DIAGNOSIS — Z79899 Other long term (current) drug therapy: Secondary | ICD-10-CM | POA: Diagnosis not present

## 2019-03-09 LAB — COMPREHENSIVE METABOLIC PANEL
ALT: 7 U/L (ref 0–44)
ANION GAP: 9 (ref 5–15)
AST: 16 U/L (ref 15–41)
Albumin: 3.6 g/dL (ref 3.5–5.0)
Alkaline Phosphatase: 64 U/L (ref 38–126)
BUN: 20 mg/dL (ref 8–23)
CO2: 21 mmol/L — ABNORMAL LOW (ref 22–32)
Calcium: 9 mg/dL (ref 8.9–10.3)
Chloride: 106 mmol/L (ref 98–111)
Creatinine, Ser: 1.18 mg/dL (ref 0.61–1.24)
GFR calc Af Amer: >60 mL/min (ref >60)
GFR calc non Af Amer: >60 mL/min (ref >60)
Glucose, Bld: 81 mg/dL (ref 70–99)
Potassium: 3.9 mmol/L (ref 3.5–5.1)
Sodium: 136 mmol/L (ref 135–145)
Total Bilirubin: 0.5 mg/dL (ref 0.3–1.2)
Total Protein: 8 g/dL (ref 6.5–8.1)

## 2019-03-09 LAB — CBC WITH DIFFERENTIAL/PLATELET
Abs Immature Granulocytes: 0.01 10*3/uL (ref 0.00–0.07)
Basophils Absolute: 0 10*3/uL (ref 0.0–0.1)
Basophils Relative: 1 %
Eosinophils Absolute: 0.1 10*3/uL (ref 0.0–0.5)
Eosinophils Relative: 2 %
HCT: 37.7 % — ABNORMAL LOW (ref 39.0–52.0)
HEMOGLOBIN: 12.4 g/dL — AB (ref 13.0–17.0)
Immature Granulocytes: 0 %
LYMPHS PCT: 39 %
Lymphs Abs: 1.8 10*3/uL (ref 0.7–4.0)
MCH: 32 pg (ref 26.0–34.0)
MCHC: 32.9 g/dL (ref 30.0–36.0)
MCV: 97.2 fL (ref 80.0–100.0)
Monocytes Absolute: 0.3 10*3/uL (ref 0.1–1.0)
Monocytes Relative: 7 %
NEUTROS ABS: 2.4 10*3/uL (ref 1.7–7.7)
Neutrophils Relative %: 51 %
Platelets: 321 10*3/uL (ref 150–400)
RBC: 3.88 MIL/uL — ABNORMAL LOW (ref 4.22–5.81)
RDW: 14.8 % (ref 11.5–15.5)
WBC: 4.7 10*3/uL (ref 4.0–10.5)
nRBC: 0 % (ref 0.0–0.2)

## 2019-03-09 LAB — SEDIMENTATION RATE: Sed Rate: 52 mm/hr — ABNORMAL HIGH (ref 0–20)

## 2019-03-09 LAB — URIC ACID: Uric Acid, Serum: 8 mg/dL (ref 3.7–8.6)

## 2019-03-09 MED ORDER — NAPROXEN 500 MG PO TABS: 500.0000 mg | ORAL_TABLET | Freq: Two times a day (BID) | ORAL | 2 refills | Status: AC

## 2019-03-09 MED ORDER — KETOROLAC TROMETHAMINE 30 MG/ML IJ SOLN
15.0000 mg | Freq: Once | INTRAMUSCULAR | Status: AC
  Administered 2019-03-09: 15 mg via INTRAVENOUS
  Filled 2019-03-09: qty 1

## 2019-03-09 NOTE — ED Triage Notes (Signed)
Pt arrived via EMS from home where pt called EMS for evaluation of left foot pain. Pt denies injury. No swelling or obvious deformity noted to left foot. Pt admits to ETOH.

## 2019-03-09 NOTE — ED Provider Notes (Signed)
[facility] Emergency Department Provider Note   ____________________________________________   First MD Initiated Contact with Patient 03/09/19 [phone]     (approximate)  I have reviewed the triage vital signs and the nursing notes.   HISTORY  Chief Complaint Foot Pain    HPI Troy Shepherd is a 73 y.o. male patient complains of pain in his left foot.  Seems to come on earlier yesterday.  Seem to got worse at work.  Patient then had a get even worse at home so he called EMS to bring him in for check.  It hurts too much to walk.  He thinks maybe he has the gout but he is never had gout before.  His whole foot is tender to palpation.  Is not warm or red though.  Got a good posterior tibial pulse.         Past Medical History:  Diagnosis Date  . Alcohol abuse    quit 09/2014  . Arthritis   . Hypertension   . Leukopenia    resolved when stopped drinking 09/2014    Patient Active Problem List   Diagnosis Date Noted  . Chest pain 03/27/2018  . Abnormal EKG 03/27/2018  . Anemia 03/27/2018  . Acute viral syndrome 05/09/2017  . Tick bite of groin 05/09/2017  . Generalized weakness 05/09/2017  . Ambulatory dysfunction 05/09/2017  . Alcohol dependence ([place]) 03/05/2017  . Sepsis ([place]) 01/06/2016  . Hypokalemia 01/06/2016  . HTN (hypertension) 01/06/2016  . Nausea and vomiting 01/06/2016  . Dehydration 01/06/2016    Past Surgical History:  Procedure Laterality Date  . APPENDECTOMY    . COLONOSCOPY N/A 11/09/2015   Procedure: COLONOSCOPY;  Surgeon: Josefine Class, MD;  Location[facility] END[facility]COPY;  Service: Endoscopy;  Laterality: N/A;    Prior to Admission medications   Medication Sig Start Date End Date Taking? Authorizing Provider  amLODipine (NORVASC) 5 MG tablet Take 1 tablet (5 mg total) by mouth 2 (two) times daily. 03/29/18   Loletha Grayer, MD  aspirin EC 81 MG EC tablet Take 1 tablet (81 mg total) by mouth daily. 03/29/18   Loletha Grayer, MD  folic acid (FOLVITE) 1 MG tablet Take 1 tablet (1 mg total) by mouth daily. 03/29/18   Loletha Grayer, MD  levofloxacin (LEVAQUIN) 750 MG tablet Take 1 tablet (750 mg total) by mouth daily. 03/29/18   Loletha Grayer, MD  metoprolol tartrate (LOPRESSOR) 25 MG tablet Take 1 tablet (25 mg total) by mouth 2 (two) times daily. 03/29/18   Loletha Grayer, MD  Multiple Vitamin (MULTIVITAMIN WITH MINERALS) TABS tablet Take 1 tablet by mouth daily. 03/29/18   Loletha Grayer, MD  thiamine 100 MG tablet Take 1 tablet (100 mg total) by mouth daily. 03/29/18   Loletha Grayer, MD    Allergies Patient has no known allergies.  Family History  Problem Relation Age of Onset  . COPD Sister     Social History Social History   Tobacco Use  . Smoking status: Current Every Day Smoker    Packs/day: 0.10    Years: 40.00    Pack years: 4.00    Types: Cigarettes  . Smokeless tobacco: Never Used  Substance Use Topics  . Alcohol use: Yes    Alcohol/week: 3.0 standard drinks    Types: 3 Shots of liquor per week    Comment: per pt drinks twice/d 5 days a wk  . Drug use: No    Review of Systems  Constitutional: No fever/chills  Eyes: No visual changes. ENT: No sore throat. Cardiovascular: Denies chest pain. Respiratory: Denies shortness of breath. Gastrointestinal: No abdominal pain.  No nausea, no vomiting.  No diarrhea.  No constipation. Genitourinary: Negative for dysuria. Musculoskeletal: Negative for back pain. Skin: Negative for rash. Neurological: Negative for headaches, focal weakness  ____________________________________________   PHYSICAL EXAM:  VITAL SIGNS: ED Triage Vitals [03/09/19 0302]  Enc Vitals Group     BP (!) 165/97     Pulse Rate 75     Resp 18     Temp 97.7 F (36.5 C)     Temp Source Oral     SpO2 98 %     Weight      Height      Head Circumference      Peak Flow      Pain Score      Pain Loc      Pain Edu?      Excl. in [place]?    Constitutional:  Alert and oriented.  Moaning with foot pain Eyes: Conjunctivae are normal.  Head: Atraumatic. Nose: No congestion/rhinnorhea. Mouth/Throat: Mucous membranes are moist.  Oropharynx non-erythematous. Neck: No stridor.   Cardiovascular: Normal rate, regular rhythm. Grossly normal heart sounds.  Good peripheral circulation. Respiratory: Normal respiratory effort.  No retractions. Lungs CTAB. Gastrointestinal: Soft and nontender. No distention. No abdominal bruits. No CVA tenderness. Musculoskeletal: Left foot is tender to palpation from the calcaneus to the toes.  There is no apparent redness or swelling is the same temperature as the other foot pulses are about the same 2. Neurologic:  Normal speech and language. No gross focal neurologic deficits are appreciated.  Skin:  Skin is warm, dry and intact. No rash noted.   ____________________________________________   LABS (all labs ordered are listed, but only abnormal results are displayed)  Labs Reviewed  CBC WITH DIFFERENTIAL/PLATELET - Abnormal; Notable for the following components:      Result Value   RBC 3.88 (*)    Hemoglobin 12.4 (*)    HCT 37.7 (*)    All other components within normal limits  COMPREHENSIVE METABOLIC PANEL - Abnormal; Notable for the following components:   CO2 21 (*)    All other components within normal limits  URIC ACID  SEDIMENTATION RATE   ____________________________________________  EKG   ____________________________________________  RADIOLOGY  ED MD interpretation: X-ray read by radiology reviewed by me shows no acute pathology  Official radiology report(s): Dg Foot Complete Left  Result Date: 03/09/2019 CLINICAL DATA:  Foot pain EXAM: LEFT FOOT - COMPLETE 3+ VIEW COMPARISON:  None. FINDINGS: No fracture or malalignment. No radiopaque foreign body in the soft tissues. IMPRESSION: No acute osseous abnormality Electronically Signed   By: Donavan Foil M.D.   On: 03/09/2019 03:17     ____________________________________________   PROCEDURES  Procedure(s) performed (including Critical Care):  Procedures   ____________________________________________   INITIAL IMPRESSION / ASSESSMENT AND PLAN / ED COURSE  Patient's history reviewed reviewed with Dr. Corky Downs.  Labs are still pending.  Dispo will be per labs likely home              ____________________________________________   FINAL CLINICAL IMPRESSION(S) / ED DIAGNOSES  Final diagnoses:  Foot pain, left     ED Discharge Orders    None       Note:  This document was prepared using Dragon voice recognition software and may include unintentional dictation errors.    Nena Polio, MD 03/09/19 [phone]

## 2020-03-04 ENCOUNTER — Encounter: Payer: Self-pay | Admitting: Emergency Medicine

## 2020-03-04 ENCOUNTER — Inpatient Hospital Stay
Admission: EM | Admit: 2020-03-04 | Discharge: 2020-03-07 | DRG: 987 | Disposition: A | Discharge status: Home Health | Payer: Medicare HMO | Attending: Internal Medicine | Admitting: Internal Medicine

## 2020-03-04 ENCOUNTER — Other Ambulatory Visit: Payer: Self-pay

## 2020-03-04 ENCOUNTER — Emergency Department: Payer: Medicare HMO

## 2020-03-04 DIAGNOSIS — I129 Hypertensive chronic kidney disease with stage 1 through stage 4 chronic kidney disease, or unspecified chronic kidney disease: Secondary | ICD-10-CM | POA: Diagnosis present

## 2020-03-04 DIAGNOSIS — R778 Other specified abnormalities of plasma proteins: Secondary | ICD-10-CM

## 2020-03-04 DIAGNOSIS — J188 Other pneumonia, unspecified organism: Secondary | ICD-10-CM | POA: Diagnosis not present

## 2020-03-04 DIAGNOSIS — R079 Chest pain, unspecified: Secondary | ICD-10-CM | POA: Diagnosis not present

## 2020-03-04 DIAGNOSIS — R059 Cough, unspecified: Secondary | ICD-10-CM

## 2020-03-04 DIAGNOSIS — I248 Other forms of acute ischemic heart disease: Secondary | ICD-10-CM | POA: Diagnosis present

## 2020-03-04 DIAGNOSIS — Z79899 Other long term (current) drug therapy: Secondary | ICD-10-CM | POA: Diagnosis not present

## 2020-03-04 DIAGNOSIS — E876 Hypokalemia: Secondary | ICD-10-CM | POA: Diagnosis not present

## 2020-03-04 DIAGNOSIS — R9389 Abnormal findings on diagnostic imaging of other specified body structures: Secondary | ICD-10-CM | POA: Diagnosis not present

## 2020-03-04 DIAGNOSIS — R937 Abnormal findings on diagnostic imaging of other parts of musculoskeletal system: Secondary | ICD-10-CM | POA: Diagnosis not present

## 2020-03-04 DIAGNOSIS — J9620 Acute and chronic respiratory failure, unspecified whether with hypoxia or hypercapnia: Secondary | ICD-10-CM | POA: Diagnosis not present

## 2020-03-04 DIAGNOSIS — M059 Rheumatoid arthritis with rheumatoid factor, unspecified: Secondary | ICD-10-CM | POA: Diagnosis not present

## 2020-03-04 DIAGNOSIS — E43 Unspecified severe protein-calorie malnutrition: Secondary | ICD-10-CM | POA: Diagnosis present

## 2020-03-04 DIAGNOSIS — I4891 Unspecified atrial fibrillation: Secondary | ICD-10-CM | POA: Diagnosis not present

## 2020-03-04 DIAGNOSIS — M069 Rheumatoid arthritis, unspecified: Secondary | ICD-10-CM | POA: Diagnosis present

## 2020-03-04 DIAGNOSIS — Z716 Tobacco abuse counseling: Secondary | ICD-10-CM | POA: Diagnosis not present

## 2020-03-04 DIAGNOSIS — J9621 Acute and chronic respiratory failure with hypoxia: Secondary | ICD-10-CM | POA: Diagnosis not present

## 2020-03-04 DIAGNOSIS — J9601 Acute respiratory failure with hypoxia: Secondary | ICD-10-CM | POA: Diagnosis present

## 2020-03-04 DIAGNOSIS — J440 Chronic obstructive pulmonary disease with acute lower respiratory infection: Secondary | ICD-10-CM | POA: Diagnosis present

## 2020-03-04 DIAGNOSIS — C770 Secondary and unspecified malignant neoplasm of lymph nodes of head, face and neck: Secondary | ICD-10-CM | POA: Diagnosis present

## 2020-03-04 DIAGNOSIS — Z7982 Long term (current) use of aspirin: Secondary | ICD-10-CM

## 2020-03-04 DIAGNOSIS — R7989 Other specified abnormal findings of blood chemistry: Secondary | ICD-10-CM | POA: Diagnosis not present

## 2020-03-04 DIAGNOSIS — Z6821 Body mass index (BMI) 21.0-21.9, adult: Secondary | ICD-10-CM | POA: Diagnosis not present

## 2020-03-04 DIAGNOSIS — J189 Pneumonia, unspecified organism: Secondary | ICD-10-CM | POA: Diagnosis not present

## 2020-03-04 DIAGNOSIS — R7401 Elevation of levels of liver transaminase levels: Secondary | ICD-10-CM | POA: Diagnosis not present

## 2020-03-04 DIAGNOSIS — C342 Malignant neoplasm of middle lobe, bronchus or lung: Secondary | ICD-10-CM | POA: Diagnosis not present

## 2020-03-04 DIAGNOSIS — N182 Chronic kidney disease, stage 2 (mild): Secondary | ICD-10-CM | POA: Diagnosis present

## 2020-03-04 DIAGNOSIS — R519 Headache, unspecified: Secondary | ICD-10-CM | POA: Diagnosis not present

## 2020-03-04 DIAGNOSIS — I313 Pericardial effusion (noninflammatory): Secondary | ICD-10-CM | POA: Diagnosis not present

## 2020-03-04 DIAGNOSIS — J441 Chronic obstructive pulmonary disease with (acute) exacerbation: Secondary | ICD-10-CM | POA: Diagnosis present

## 2020-03-04 DIAGNOSIS — J181 Lobar pneumonia, unspecified organism: Principal | ICD-10-CM | POA: Diagnosis present

## 2020-03-04 DIAGNOSIS — R591 Generalized enlarged lymph nodes: Secondary | ICD-10-CM | POA: Diagnosis not present

## 2020-03-04 DIAGNOSIS — I10 Essential (primary) hypertension: Secondary | ICD-10-CM | POA: Diagnosis not present

## 2020-03-04 DIAGNOSIS — R64 Cachexia: Secondary | ICD-10-CM | POA: Diagnosis present

## 2020-03-04 DIAGNOSIS — Z825 Family history of asthma and other chronic lower respiratory diseases: Secondary | ICD-10-CM

## 2020-03-04 DIAGNOSIS — Z20822 Contact with and (suspected) exposure to covid-19: Secondary | ICD-10-CM | POA: Diagnosis present

## 2020-03-04 DIAGNOSIS — C3490 Malignant neoplasm of unspecified part of unspecified bronchus or lung: Secondary | ICD-10-CM | POA: Diagnosis present

## 2020-03-04 DIAGNOSIS — C642 Malignant neoplasm of left kidney, except renal pelvis: Secondary | ICD-10-CM | POA: Diagnosis not present

## 2020-03-04 DIAGNOSIS — R590 Localized enlarged lymph nodes: Secondary | ICD-10-CM | POA: Diagnosis present

## 2020-03-04 DIAGNOSIS — Z720 Tobacco use: Secondary | ICD-10-CM | POA: Diagnosis not present

## 2020-03-04 DIAGNOSIS — F1011 Alcohol abuse, in remission: Secondary | ICD-10-CM | POA: Diagnosis present

## 2020-03-04 DIAGNOSIS — C7951 Secondary malignant neoplasm of bone: Secondary | ICD-10-CM | POA: Diagnosis not present

## 2020-03-04 DIAGNOSIS — R05 Cough: Secondary | ICD-10-CM | POA: Diagnosis present

## 2020-03-04 DIAGNOSIS — F17210 Nicotine dependence, cigarettes, uncomplicated: Secondary | ICD-10-CM | POA: Diagnosis present

## 2020-03-04 DIAGNOSIS — R918 Other nonspecific abnormal finding of lung field: Secondary | ICD-10-CM | POA: Diagnosis not present

## 2020-03-04 LAB — BASIC METABOLIC PANEL
Anion gap: 13 (ref 5–15)
BUN: 29 mg/dL — ABNORMAL HIGH (ref 8–23)
CO2: 21 mmol/L — ABNORMAL LOW (ref 22–32)
Calcium: 10.8 mg/dL — ABNORMAL HIGH (ref 8.9–10.3)
Chloride: 101 mmol/L (ref 98–111)
Creatinine, Ser: 1.43 mg/dL — ABNORMAL HIGH (ref 0.61–1.24)
GFR calc Af Amer: 56 mL/min — ABNORMAL LOW (ref >60)
GFR calc non Af Amer: 48 mL/min — ABNORMAL LOW (ref >60)
Glucose, Bld: 96 mg/dL (ref 70–99)
Potassium: 3.5 mmol/L (ref 3.5–5.1)
Sodium: 135 mmol/L (ref 135–145)

## 2020-03-04 LAB — CBC
HCT: 42 % (ref 39.0–52.0)
Hemoglobin: 14.5 g/dL (ref 13.0–17.0)
MCH: 33.1 pg (ref 26.0–34.0)
MCHC: 34.5 g/dL (ref 30.0–36.0)
MCV: 95.9 fL (ref 80.0–100.0)
Platelets: 329 10*3/uL (ref 150–400)
RBC: 4.38 MIL/uL (ref 4.22–5.81)
RDW: 12.6 % (ref 11.5–15.5)
WBC: 11.3 10*3/uL — ABNORMAL HIGH (ref 4.0–10.5)
nRBC: 0 % (ref 0.0–0.2)

## 2020-03-04 LAB — TROPONIN I (HIGH SENSITIVITY)
Troponin I (High Sensitivity): 16 ng/L (ref <18)
Troponin I (High Sensitivity): 13 ng/L (ref <18)

## 2020-03-04 LAB — FIBRINOGEN: Fibrinogen: 219 mg/dL (ref 210–475)

## 2020-03-04 LAB — PROCALCITONIN: Procalcitonin: 0.49 ng/mL

## 2020-03-04 LAB — LACTIC ACID, PLASMA
Lactic Acid, Venous: 1.3 mmol/L (ref 0.5–1.9)
Lactic Acid, Venous: 1.5 mmol/L (ref 0.5–1.9)

## 2020-03-04 LAB — FIBRIN DERIVATIVES D-DIMER (ARMC ONLY): Fibrin derivatives D-dimer (ARMC): 7311.17 ng/mL (FEU) — ABNORMAL HIGH (ref 0.00–499.00)

## 2020-03-04 LAB — FERRITIN: Ferritin: 720 ng/mL — ABNORMAL HIGH (ref 24–336)

## 2020-03-04 LAB — TRIGLYCERIDES: Triglycerides: 114 mg/dL (ref <150)

## 2020-03-04 LAB — RESPIRATORY PANEL BY RT PCR (FLU A&B, COVID)
Influenza A by PCR: NEGATIVE
Influenza B by PCR: NEGATIVE
SARS Coronavirus 2 by RT PCR: NEGATIVE

## 2020-03-04 LAB — C-REACTIVE PROTEIN: CRP: 18.3 mg/dL — ABNORMAL HIGH (ref <1.0)

## 2020-03-04 MED ORDER — DEXAMETHASONE SODIUM PHOSPHATE 10 MG/ML IJ SOLN
6.0000 mg | Freq: Once | INTRAMUSCULAR | Status: AC
  Administered 2020-03-04: 6 mg via INTRAVENOUS
  Filled 2020-03-04: qty 1

## 2020-03-04 MED ORDER — ACETAMINOPHEN 650 MG RE SUPP: 650.0000 mg | Freq: Four times a day (QID) | RECTAL | Status: DC | PRN

## 2020-03-04 MED ORDER — ONDANSETRON HCL 4 MG PO TABS: 4.0000 mg | ORAL_TABLET | Freq: Four times a day (QID) | ORAL | Status: DC | PRN

## 2020-03-04 MED ORDER — METOPROLOL TARTRATE 25 MG PO TABS
25.0000 mg | ORAL_TABLET | Freq: Two times a day (BID) | ORAL | Status: DC
  Administered 2020-03-04 – 2020-03-05 (×2): 25 mg via ORAL
  Filled 2020-03-04 (×2): qty 1

## 2020-03-04 MED ORDER — DEXAMETHASONE SODIUM PHOSPHATE 10 MG/ML IJ SOLN
6.0000 mg | INTRAMUSCULAR | Status: DC
  Administered 2020-03-05 – 2020-03-07 (×3): 6 mg via INTRAVENOUS
  Filled 2020-03-04 (×3): qty 1

## 2020-03-04 MED ORDER — AMLODIPINE BESYLATE 5 MG PO TABS
5.0000 mg | ORAL_TABLET | Freq: Two times a day (BID) | ORAL | Status: DC
  Administered 2020-03-04 – 2020-03-05 (×2): 5 mg via ORAL
  Filled 2020-03-04 (×2): qty 1

## 2020-03-04 MED ORDER — ASPIRIN EC 81 MG PO TBEC
81.0000 mg | DELAYED_RELEASE_TABLET | Freq: Every day | ORAL | Status: DC
  Administered 2020-03-05: 81 mg via ORAL
  Filled 2020-03-04: qty 1

## 2020-03-04 MED ORDER — ENOXAPARIN SODIUM 40 MG/0.4ML ~~LOC~~ SOLN
40.0000 mg | SUBCUTANEOUS | Status: DC
  Administered 2020-03-04: 40 mg via SUBCUTANEOUS
  Filled 2020-03-04: qty 0.4

## 2020-03-04 MED ORDER — GUAIFENESIN 100 MG/5ML PO SOLN
5.0000 mL | ORAL | Status: DC | PRN
  Filled 2020-03-04: qty 5

## 2020-03-04 MED ORDER — FOLIC ACID 1 MG PO TABS
1.0000 mg | ORAL_TABLET | Freq: Every day | ORAL | Status: DC
  Administered 2020-03-05 – 2020-03-07 (×3): 1 mg via ORAL
  Filled 2020-03-04 (×3): qty 1

## 2020-03-04 MED ORDER — SODIUM CHLORIDE 0.9 % IV SOLN
2.0000 g | INTRAVENOUS | Status: AC
  Administered 2020-03-04: 2 g via INTRAVENOUS
  Filled 2020-03-04: qty 20

## 2020-03-04 MED ORDER — THIAMINE HCL 100 MG PO TABS
100.0000 mg | ORAL_TABLET | Freq: Every day | ORAL | Status: DC
  Administered 2020-03-05 – 2020-03-07 (×3): 100 mg via ORAL
  Filled 2020-03-04 (×3): qty 1

## 2020-03-04 MED ORDER — IPRATROPIUM-ALBUTEROL 0.5-2.5 (3) MG/3ML IN SOLN: 3.0000 mL | Freq: Four times a day (QID) | RESPIRATORY_TRACT | Status: DC

## 2020-03-04 MED ORDER — ADULT MULTIVITAMIN W/MINERALS CH
1.0000 | ORAL_TABLET | Freq: Every day | ORAL | Status: DC
  Administered 2020-03-05 – 2020-03-07 (×3): 1 via ORAL
  Filled 2020-03-04 (×3): qty 1

## 2020-03-04 MED ORDER — AZITHROMYCIN 250 MG PO TABS
500.0000 mg | ORAL_TABLET | Freq: Every day | ORAL | Status: AC
  Administered 2020-03-04: 500 mg via ORAL
  Filled 2020-03-04: qty 2

## 2020-03-04 MED ORDER — AZITHROMYCIN 250 MG PO TABS
250.0000 mg | ORAL_TABLET | Freq: Every day | ORAL | Status: DC
  Administered 2020-03-05 – 2020-03-07 (×3): 250 mg via ORAL
  Filled 2020-03-04 (×3): qty 1

## 2020-03-04 MED ORDER — IPRATROPIUM-ALBUTEROL 0.5-2.5 (3) MG/3ML IN SOLN
3.0000 mL | Freq: Four times a day (QID) | RESPIRATORY_TRACT | Status: DC
  Administered 2020-03-04 – 2020-03-05 (×3): 3 mL via RESPIRATORY_TRACT
  Filled 2020-03-04 (×4): qty 3

## 2020-03-04 MED ORDER — BENZONATATE 100 MG PO CAPS
100.0000 mg | ORAL_CAPSULE | Freq: Three times a day (TID) | ORAL | Status: DC
  Administered 2020-03-04 – 2020-03-07 (×8): 100 mg via ORAL
  Filled 2020-03-04 (×8): qty 1

## 2020-03-04 MED ORDER — ACETAMINOPHEN 325 MG PO TABS
650.0000 mg | ORAL_TABLET | Freq: Four times a day (QID) | ORAL | Status: DC | PRN
  Administered 2020-03-05 (×2): 650 mg via ORAL
  Filled 2020-03-04 (×2): qty 2

## 2020-03-04 MED ORDER — ONDANSETRON HCL 4 MG/2ML IJ SOLN
4.0000 mg | Freq: Four times a day (QID) | INTRAMUSCULAR | Status: DC | PRN
  Administered 2020-03-05 – 2020-03-07 (×4): 4 mg via INTRAVENOUS
  Filled 2020-03-04 (×4): qty 2

## 2020-03-04 MED ORDER — SODIUM CHLORIDE 0.9 % IV BOLUS
1000.0000 mL | Freq: Once | INTRAVENOUS | Status: AC
  Administered 2020-03-04: 1000 mL via INTRAVENOUS

## 2020-03-04 MED ORDER — SODIUM CHLORIDE 0.9% FLUSH
3.0000 mL | Freq: Once | INTRAVENOUS | Status: AC
  Administered 2020-03-04: 3 mL via INTRAVENOUS

## 2020-03-04 NOTE — ED Notes (Signed)
X-ray at bedside

## 2020-03-04 NOTE — ED Provider Notes (Signed)
[facility] Emergency Department Provider Note ____________________________________________   First MD Initiated Contact with Patient 03/04/20 1312     (approximate)  I have reviewed the triage vital signs and the nursing notes.   HISTORY  Chief Complaint Chest Pain and Cough    HPI Troy Shepherd is a 74 y.o. male with PMH as noted below including hypertension and former alcohol abuse who presents primarily with cough over approximately last 4 days, nonproductive, and associated with shortness of breath on exertion.  Sometimes with the cough he has substernal area chest pain.  He denies any fever chills, and has no vomiting or diarrhea.  He has not been around anyone else who has been sick.  He has not been tested for COVID-19.   Past Medical History:  Diagnosis Date  . Alcohol abuse    quit 09/2014  . Arthritis   . Hypertension   . Leukopenia    resolved when stopped drinking 09/2014    Patient Active Problem List   Diagnosis Date Noted  . Chest pain 03/27/2018  . Abnormal EKG 03/27/2018  . Anemia 03/27/2018  . Acute viral syndrome 05/09/2017  . Tick bite of groin 05/09/2017  . Generalized weakness 05/09/2017  . Ambulatory dysfunction 05/09/2017  . Alcohol dependence ([place]) 03/05/2017  . Sepsis ([place]) 01/06/2016  . Hypokalemia 01/06/2016  . HTN (hypertension) 01/06/2016  . Nausea and vomiting 01/06/2016  . Dehydration 01/06/2016    Past Surgical History:  Procedure Laterality Date  . APPENDECTOMY    . COLONOSCOPY N/A 11/09/2015   Procedure: COLONOSCOPY;  Surgeon: Josefine Class, MD;  Location: [facility] ENDOSCOPY;  Service: Endoscopy;  Laterality: N/A;    Prior to Admission medications   Medication Sig Start Date End Date Taking? Authorizing Provider  amLODipine (NORVASC) 5 MG tablet Take 1 tablet (5 mg total) by mouth 2 (two) times daily. 03/29/18   Loletha Grayer, MD  aspirin EC 81 MG EC tablet Take 1 tablet (81 mg total) by mouth  daily. 03/29/18   Loletha Grayer, MD  folic acid (FOLVITE) 1 MG tablet Take 1 tablet (1 mg total) by mouth daily. 03/29/18   Loletha Grayer, MD  metoprolol tartrate (LOPRESSOR) 25 MG tablet Take 1 tablet (25 mg total) by mouth 2 (two) times daily. 03/29/18   Loletha Grayer, MD  Multiple Vitamin (MULTIVITAMIN WITH MINERALS) TABS tablet Take 1 tablet by mouth daily. 03/29/18   Loletha Grayer, MD  naproxen (NAPROSYN) 500 MG tablet Take 1 tablet (500 mg total) by mouth 2 (two) times daily with a meal. 03/09/19   Lavonia Drafts, MD  thiamine 100 MG tablet Take 1 tablet (100 mg total) by mouth daily. 03/29/18   Loletha Grayer, MD    Allergies Patient has no known allergies.  Family History  Problem Relation Age of Onset  . COPD Sister     Social History Social History   Tobacco Use  . Smoking status: Current Every Day Smoker    Packs/day: 0.10    Years: 40.00    Pack years: 4.00    Types: Cigarettes  . Smokeless tobacco: Never Used  Substance Use Topics  . Alcohol use: Yes    Alcohol/week: 3.0 standard drinks    Types: 3 Shots of liquor per week    Comment: per pt drinks twice/d 5 days a wk  . Drug use: No    Review of Systems  Constitutional: No fever. Eyes: No visual changes. ENT: No sore throat. Cardiovascular: Positive for intermittent chest  pain. Respiratory: Positive for shortness of breath. Gastrointestinal: No vomiting or diarrhea.  Genitourinary: Negative for dysuria.  Musculoskeletal: Negative for back pain. Skin: Negative for rash. Neurological: Negative for headache.   ____________________________________________   PHYSICAL EXAM:  VITAL SIGNS: ED Triage Vitals  Enc Vitals Group     BP 03/04/20 1210 (!) 162/65     Pulse Rate 03/04/20 1210 (!) 106     Resp 03/04/20 1210 16     Temp 03/04/20 1210 98.5 F (36.9 C)     Temp Source 03/04/20 1210 Oral     SpO2 03/04/20 1210 (!) 87 %     Weight 03/04/20 1210 140 lb (63.5 kg)     Height 03/04/20 1210 5\' 7"   (1.702 m)     Head Circumference --      Peak Flow --      Pain Score 03/04/20 1215 8     Pain Loc --      Pain Edu? --      Excl. in [place]? --     Constitutional: Alert and oriented.  Relatively well appearing and in no acute distress. Eyes: Conjunctivae are normal.  Head: Atraumatic. Nose: No congestion/rhinnorhea. Mouth/Throat: Mucous membranes are moist.   Neck: Normal range of motion.  Cardiovascular: Normal rate, regular rhythm. Grossly normal heart sounds.  Good peripheral circulation. Respiratory: Normal respiratory effort.  No retractions. Lungs CTAB. Gastrointestinal: Soft and nontender. No distention.  Genitourinary: No flank tenderness. Musculoskeletal: No lower extremity edema.  Extremities warm and well perfused.  Neurologic:  Normal speech and language. No gross focal neurologic deficits are appreciated.  Skin:  Skin is warm and dry. No rash noted. Psychiatric: Mood and affect are normal. Speech and behavior are normal.  ____________________________________________   LABS (all labs ordered are listed, but only abnormal results are displayed)  Labs Reviewed  BASIC METABOLIC PANEL - Abnormal; Notable for the following components:      Result Value   CO2 21 (*)    BUN 29 (*)    Creatinine, Ser 1.43 (*)    Calcium 10.8 (*)    GFR calc non Af Amer 48 (*)    GFR calc Af Amer 56 (*)    All other components within normal limits  CBC - Abnormal; Notable for the following components:   WBC 11.3 (*)    All other components within normal limits  RESPIRATORY PANEL BY RT PCR (FLU A&B, COVID)  CULTURE, BLOOD (ROUTINE X 2)  CULTURE, BLOOD (ROUTINE X 2)  LACTIC ACID, PLASMA  LACTIC ACID, PLASMA  FIBRIN DERIVATIVES D-DIMER ([facility] ONLY)  FIBRINOGEN  C-REACTIVE PROTEIN  PROCALCITONIN  TRIGLYCERIDES  FERRITIN  TROPONIN I (HIGH SENSITIVITY)  TROPONIN I (HIGH SENSITIVITY)   ____________________________________________  EKG  ED ECG REPORT I, Arta Silence, the  attending physician, personally viewed and interpreted this ECG.  Date: 03/04/2020 EKG Time: 1546 Rate: 94 Rhythm: normal sinus rhythm QRS Axis: normal Intervals: normal ST/T Wave abnormalities: nonspecific ST abnormality Narrative Interpretation: Nonspecific ST abnormalities with no evidence of acute ischemia; interpretation limited by poor baseline  ____________________________________________  RADIOLOGY  CXR: Bilateral lower lobe infiltrates  ____________________________________________   PROCEDURES  Procedure(s) performed: No  Procedures  Critical Care performed: No ____________________________________________   INITIAL IMPRESSION / ASSESSMENT AND PLAN / ED COURSE  Pertinent labs & imaging results that were available during my care of the patient were reviewed by me and considered in my medical decision making (see chart for details).  74 year old male with PMH as  noted above presents with primarily cough over the last several days associated with some intermittent chest pain.  He has had no fever or GI symptoms.  I reviewed the past medical records in [place].  He has had no recent ED visits or admissions and has no cardiac history.   On exam, the patient is overall well-appearing.  He does have mild hypertension, and his O2 saturation is in the high 80s on room air.  Improved to the high 90s on nasal cannula.  He does not have respiratory distress at this time.  Differential includes pneumonia, COVID-19, acute bronchitis, or less likely CHF or ACS.  We will obtain a chest x-ray, lab work-up, and reassess.  ----------------------------------------- 3:02 PM on 03/04/2020 -----------------------------------------  Chest x-ray shows bilateral infiltrates concerning for COVID-19.  I have ordered a Covid swab as well as some additional lab work-up.  I anticipate that the patient will need admission.  I will hold off on antibiotics until the Covid result, given that the  patient is relatively stable and has no signs of sepsis.  ----------------------------------------- 3:39 PM on 03/04/2020 -----------------------------------------  I discussed the case with the hospitalist Dr. Leslye Peer for admission.  The Covid test is pending.  ____________________________  Troy Shepherd was evaluated in Emergency Department on 03/04/2020 for the symptoms described in the history of present illness. He was evaluated in the context of the global COVID-19 pandemic, which necessitated consideration that the patient might be at risk for infection with the SARS-CoV-2 virus that causes COVID-19. Institutional protocols and algorithms that pertain to the evaluation of patients at risk for COVID-19 are in a state of rapid change based on information released by regulatory bodies including the CDC and federal and state organizations. These policies and algorithms were followed during the patient's care in the ED.  ____________________________________________   FINAL CLINICAL IMPRESSION(S) / ED DIAGNOSES  Final diagnoses:  Community acquired pneumonia, unspecified laterality      NEW MEDICATIONS STARTED DURING THIS VISIT:  New Prescriptions   No medications on file     Note:  This document was prepared using Dragon voice recognition software and may include unintentional dictation errors.    Arta Silence, MD 03/04/20 1547

## 2020-03-04 NOTE — ED Notes (Addendum)
Report given to Cory Roughen, [place]

## 2020-03-04 NOTE — ED Triage Notes (Signed)
Pt presents to ED via POV with c/o cough, and SOB. Pt states when he coughs he has substernal CP. Upon arrival to ED pt's RA sats 85-87% on RA. Pt also c/o dyspnea on exertion. Pt placed on 2L via [place] at this time.

## 2020-03-04 NOTE — H&P (Signed)
[facility] [place] [place] NAME: Troy Shepherd    MR#:  [id number]  DATE OF BIRTH:  [birth year]  DATE OF ADMISSION:  03/04/2020  PRIMARY CARE PHYSICIAN: Dr Gwynneth Aliment  REQUESTING/REFERRING PHYSICIAN: Dr Arta Silence  CHIEF COMPLAINT:   Chief Complaint  Patient presents with  . Chest Pain  . Cough    HISTORY OF PRESENT ILLNESS:  Troy Shepherd  is a 74 y.o. male coming in with chest pain and cough going on since Wednesday.  For the past 4 days he has not been feeling well.  He has had cough and chest discomfort with coughing.  His cough is nonproductive.  No wheeze.  No fever or chills but does feel weak.  In the ER he was found to be hypoxic with a pulse ox of 87% on room air and patient was placed on oxygen.  Patient was also found to have bilateral lower lung infiltrates on chest x-ray concerning for pneumonia.  Hospitalist services were contacted for evaluation.  COVID-19 test ordered.  PAST MEDICAL HISTORY:   Past Medical History:  Diagnosis Date  . Alcohol abuse    quit 09/2014  . Arthritis   . Hypertension   . Leukopenia    resolved when stopped drinking 09/2014    PAST SURGICAL HISTORY:   Past Surgical History:  Procedure Laterality Date  . APPENDECTOMY    . COLONOSCOPY N/A 11/09/2015   Procedure: COLONOSCOPY;  Surgeon: Josefine Class, MD;  Location: [facility] ENDOSCOPY;  Service: Endoscopy;  Laterality: N/A;    SOCIAL HISTORY:   Social History   Tobacco Use  . Smoking status: Current Every Day Smoker    Packs/day: 0.10    Years: 40.00    Pack years: 4.00    Types: Cigarettes  . Smokeless tobacco: Never Used  Substance Use Topics  . Alcohol use: Yes    Alcohol/week: 3.0 standard drinks    Types: 3 Shots of liquor per week    Comment: per pt drinks twice/d 5 days a wk    FAMILY HISTORY:   Family History  Problem Relation Age of Onset  . COPD Sister     DRUG ALLERGIES:  No Known Allergies  REVIEW OF SYSTEMS:   CONSTITUTIONAL: No fever, chills or sweats.  Positive for fatigue.  EYES: No blurred or double vision.  Wears glasses EARS, NOSE, AND THROAT: No tinnitus or ear pain.  Some runny nose.  Some sore throat. RESPIRATORY: Positive for cough and shortness of breath.  No wheezing or hemoptysis.  CARDIOVASCULAR: Positive for chest pain.no orthopnea, edema.  GASTROINTESTINAL: No nausea, vomiting, diarrhea or abdominal pain. No blood in bowel movements GENITOURINARY: No dysuria, hematuria.  ENDOCRINE: No polyuria, nocturia,  HEMATOLOGY: No anemia, easy bruising or bleeding SKIN: No rash or lesion. MUSCULOSKELETAL: No joint pain or arthritis.   NEUROLOGIC: No tingling, numbness, weakness.  PSYCHIATRY: No anxiety or depression.   MEDICATIONS AT HOME:   Prior to Admission medications   Medication Sig Start Date End Date Taking? Authorizing Provider  amLODipine (NORVASC) 5 MG tablet Take 1 tablet (5 mg total) by mouth 2 (two) times daily. 03/29/18   Loletha Grayer, MD  aspirin EC 81 MG EC tablet Take 1 tablet (81 mg total) by mouth daily. 03/29/18   Loletha Grayer, MD  folic acid (FOLVITE) 1 MG tablet Take 1 tablet (1 mg total) by mouth daily. 03/29/18   Loletha Grayer, MD  metoprolol tartrate (LOPRESSOR) 25 MG tablet Take 1 tablet (25  mg total) by mouth 2 (two) times daily. 03/29/18   Loletha Grayer, MD  Multiple Vitamin (MULTIVITAMIN WITH MINERALS) TABS tablet Take 1 tablet by mouth daily. 03/29/18   Loletha Grayer, MD  naproxen (NAPROSYN) 500 MG tablet Take 1 tablet (500 mg total) by mouth 2 (two) times daily with a meal. 03/09/19   Lavonia Drafts, MD  thiamine 100 MG tablet Take 1 tablet (100 mg total) by mouth daily. 03/29/18   Loletha Grayer, MD   Medication reconciliation still undergoing  VITAL SIGNS:  Blood pressure (!) 147/78, pulse 82, temperature 98.5 F (36.9 C), temperature source Oral, resp. rate 16, height 5\' 7"  (1.702 m), weight 63.5 kg, SpO2 95 %.  PHYSICAL EXAMINATION:   GENERAL:  74 y.o.-year-old patient lying in the bed with no acute distress.  EYES: Pupils equal, round, reactive to light and accommodation. No scleral icterus. Extraocular muscles intact.  HEENT: Head atraumatic, normocephalic. Oropharynx and nasopharynx clear.  Right tympanic membrane slight erythema. NECK:  Supple, no jugular venous distention. No thyroid enlargement, no tenderness.  LUNGS: Decreased breath sounds bilateral bases.  Positive rhonchi at the bases no use of accessory muscles of respiration.  CARDIOVASCULAR: S1, S2 normal. No murmurs, rubs, or gallops.  ABDOMEN: Soft, nontender, nondistended. No organomegaly or mass.  EXTREMITIES: No pedal edema.  NEUROLOGIC: Cranial nerves II through XII are intact. Sensation intact. Gait not checked.  PSYCHIATRIC: The patient is alert and oriented x 3.  SKIN: No rash, lesion, or ulcer.   LABORATORY PANEL:   CBC Recent Labs  Lab 03/04/20 1219  WBC 11.3*  HGB 14.5  HCT 42.0  PLT 329   ------------------------------------------------------------------------------------------------------------------  Chemistries  Recent Labs  Lab 03/04/20 1219  NA 135  K 3.5  CL 101  CO2 21*  GLUCOSE 96  BUN 29*  CREATININE 1.43*  CALCIUM 10.8*   ------------------------------------------------------------------------------------------------------------------  Cardiac Enzymes High-sensitivity troponin 16  RADIOLOGY:  DG Chest Port 1 View  Result Date: 03/04/2020 CLINICAL DATA:  Pt presents to ED via POV with c/o cough, and SOB. Pt states when he coughs he has substernal CP. Upon arrival to ED pt's RA sats 85-87% on RA. Pt also c/o dyspnea on exertion. Pt placed on 2L via [place] at this time. EXAM: PORTABLE CHEST 1 VIEW COMPARISON:  Chest radiograph 03/28/2018 FINDINGS: Stable cardiomediastinal contours. The heart size. Lungs are hyperinflated. There are new infiltrates throughout the bilateral lower lungs concerning for infection. Background  chronic bronchitic change. No pneumothorax or significant pleural effusion. No acute finding in the visualized skeleton. IMPRESSION: New infiltrates throughout the bilateral lower lungs concerning for infection, less likely edema. Recommend radiographic follow-up to resolution. Electronically Signed   By: Audie Pinto M.D.   On: 03/04/2020 14:12    EKG:   Sinus rhythm 94 bpm, left atrial enlargement.  No ST elevations seen on EKG.  IMPRESSION AND PLAN:   1.  Bilateral pneumonia concerning for COVID-19 infection.  COVID-19 test ordered.  Keep in isolation airborne and contact.  Start Rocephin and Zithromax.  On Decadron.  Will order albuterol inhaler and as needed cough medication.  If the patient is Covid positive he is a candidate for remdesivir treatment.  Already on steroid treatment.  Would then need to add on an order inflammatory markers. 2.  Acute hypoxic respiratory failure with pulse ox of 87% on room air.  Continue oxygen supplementation  3.  Essential hypertension on Norvasc and metoprolol 4.  Elevated troponin likely demand ischemia with acute hypoxic respiratory failure.  5.  Rheumatoid arthritis 6.  Tobacco abuse.  Only smokes 3 cigarettes a day.  No need for nicotine patch.  Discussed quitting smoking with the patient.  Time spent on smoking cessation counseling done 4 minutes by me. 7.  History of alcohol abuse in the past.  He drinks occasionally now. 58.  Reviewing old records he does have an abnormal CT scan of the chest which could be along with autoimmune disease like rheumatoid arthritis or sarcoidosis.   All the records, laboratory and radiological data are reviewed and case discussed with ED provider. Management plans discussed with the patient, and he is in agreement.  I tried to call the patient's wife to give an update but the phone number is wrong in the computer.  I did leave a message on the patient's home phone.  CODE STATUS: full code  TOTAL TIME TAKING  CARE OF THIS PATIENT: 50 minutes.    Loletha Grayer M.D on 03/04/2020 at 4:07 PM  Between 7am to 6pm - Pager - [phone]  After 6pm call admission pager [phone]  [facility]  CC: Primary care physician; Dr Gwynneth Aliment

## 2020-03-05 ENCOUNTER — Encounter: Payer: Self-pay | Admitting: Internal Medicine

## 2020-03-05 ENCOUNTER — Inpatient Hospital Stay: Payer: Medicare HMO

## 2020-03-05 DIAGNOSIS — E43 Unspecified severe protein-calorie malnutrition: Secondary | ICD-10-CM | POA: Insufficient documentation

## 2020-03-05 DIAGNOSIS — R7989 Other specified abnormal findings of blood chemistry: Secondary | ICD-10-CM

## 2020-03-05 LAB — BASIC METABOLIC PANEL
Anion gap: 10 (ref 5–15)
BUN: 36 mg/dL — ABNORMAL HIGH (ref 8–23)
CO2: 21 mmol/L — ABNORMAL LOW (ref 22–32)
Calcium: 9.4 mg/dL (ref 8.9–10.3)
Chloride: 102 mmol/L (ref 98–111)
Creatinine, Ser: 1.28 mg/dL — ABNORMAL HIGH (ref 0.61–1.24)
GFR calc Af Amer: >60 mL/min (ref >60)
GFR calc non Af Amer: 55 mL/min — ABNORMAL LOW (ref >60)
Glucose, Bld: 114 mg/dL — ABNORMAL HIGH (ref 70–99)
Potassium: 3.6 mmol/L (ref 3.5–5.1)
Sodium: 133 mmol/L — ABNORMAL LOW (ref 135–145)

## 2020-03-05 LAB — CBC
HCT: 38.8 % — ABNORMAL LOW (ref 39.0–52.0)
Hemoglobin: 13.1 g/dL (ref 13.0–17.0)
MCH: 32.3 pg (ref 26.0–34.0)
MCHC: 33.8 g/dL (ref 30.0–36.0)
MCV: 95.8 fL (ref 80.0–100.0)
Platelets: 315 10*3/uL (ref 150–400)
RBC: 4.05 MIL/uL — ABNORMAL LOW (ref 4.22–5.81)
RDW: 12.7 % (ref 11.5–15.5)
WBC: 13.2 10*3/uL — ABNORMAL HIGH (ref 4.0–10.5)
nRBC: 0 % (ref 0.0–0.2)

## 2020-03-05 LAB — PROTIME-INR
INR: 1.2 (ref 0.8–1.2)
Prothrombin Time: 14.9 seconds (ref 11.4–15.2)

## 2020-03-05 MED ORDER — IPRATROPIUM-ALBUTEROL 0.5-2.5 (3) MG/3ML IN SOLN: 3.0000 mL | Freq: Four times a day (QID) | RESPIRATORY_TRACT | Status: DC | PRN

## 2020-03-05 MED ORDER — SODIUM CHLORIDE 0.9 % IV SOLN: INTRAVENOUS | Status: DC

## 2020-03-05 MED ORDER — ENSURE ENLIVE PO LIQD
237.0000 mL | Freq: Three times a day (TID) | ORAL | Status: DC
  Administered 2020-03-05 – 2020-03-07 (×5): 237 mL via ORAL

## 2020-03-05 MED ORDER — IOHEXOL 350 MG/ML SOLN
75.0000 mL | Freq: Once | INTRAVENOUS | Status: AC | PRN
  Administered 2020-03-05: 75 mL via INTRAVENOUS

## 2020-03-05 MED ORDER — SODIUM CHLORIDE 0.9 % IV SOLN
2.0000 g | INTRAVENOUS | Status: DC
  Administered 2020-03-05 – 2020-03-07 (×3): 2 g via INTRAVENOUS
  Filled 2020-03-05: qty 20
  Filled 2020-03-05 (×3): qty 2

## 2020-03-05 MED ORDER — AMLODIPINE BESYLATE 10 MG PO TABS
10.0000 mg | ORAL_TABLET | Freq: Every day | ORAL | Status: DC
  Administered 2020-03-06 – 2020-03-07 (×2): 10 mg via ORAL
  Filled 2020-03-05 (×2): qty 1

## 2020-03-05 NOTE — Progress Notes (Signed)
Initial Nutrition Assessment  DOCUMENTATION CODES:   Severe malnutrition in context of social or environmental circumstances  INTERVENTION:   Ensure Enlive po TID, each supplement provides 350 kcal and 20 grams of protein  Magic cup TID with meals, each supplement provides 290 kcal and 9 grams of protein  MVI daily   NUTRITION DIAGNOSIS:   Severe Malnutrition related to social / environmental circumstances as evidenced by severe fat depletion, severe muscle depletion.  GOAL:   Patient will meet greater than or equal to 90% of their needs  MONITOR:   PO intake, Supplement acceptance, Labs, Weight trends, Skin, I & O's  REASON FOR ASSESSMENT:   Malnutrition Screening Tool    ASSESSMENT:   74 y/o male with h/o RA, HTN and etoh abuse who is admitted with bilateral PNA   Met with pt in room today. Pt reports fair appetite and oral intake at baseline. Pt reports eating 100% of his breakfast this morning. Pt reports that he drinks Boost at home; pt likes strawberry flavor. RD will add supplements to help pt meet his estimated needs. Per chart, pt appears weight stable at baseline.   Medications reviewed and include: aspirin, azithromycin, dexamethasone, lovenox, folic acid, MVI, thiamine, ceftriaxone   Labs reviewed: Na 133(L), BUN 36(H), creat 1.28(H) Wbc- 13.2(H)  NUTRITION - FOCUSED PHYSICAL EXAM:    Most Recent Value  Orbital Region  Moderate depletion  Upper Arm Region  Severe depletion  Thoracic and Lumbar Region  Severe depletion  Buccal Region  Moderate depletion  Temple Region  Severe depletion  Clavicle Bone Region  Severe depletion  Clavicle and Acromion Bone Region  Severe depletion  Scapular Bone Region  Severe depletion  Dorsal Hand  Severe depletion  Patellar Region  Severe depletion  Anterior Thigh Region  Severe depletion  Posterior Calf Region  Severe depletion  Edema (RD Assessment)  None  Hair  Reviewed  Eyes  Reviewed  Mouth  Reviewed  Skin   Reviewed  Nails  Reviewed     Diet Order:   Diet Order            Diet 2 gram sodium Room service appropriate? Yes; Fluid consistency: Thin  Diet effective now             EDUCATION NEEDS:   Education needs have been addressed  Skin:  Skin Assessment: Reviewed RN Assessment  Last BM:  3/6  Height:   Ht Readings from Last 1 Encounters:  03/04/20 5' 7"  (1.702 m)    Weight:   Wt Readings from Last 1 Encounters:  03/04/20 63.5 kg    Ideal Body Weight:  67.27 kg  BMI:  Body mass index is 21.93 kg/m.  Estimated Nutritional Needs:   Kcal:  1700-2000kcal/day  Protein:  85-100g/day  Fluid:  >1.6L/day  Koleen Distance MS, RD, LDN Contact information available in Amion

## 2020-03-05 NOTE — Progress Notes (Signed)
Patient ID: Troy Shepherd, male   DOB: 28-Nov-1946, 74 y.o.   MRN: [id number]  Reviewed abnormal CT scan of the chest with interventional radiologist.  I will set him up for a ultrasounded guided biopsy of the supraclavicular lymph node or axillary lymph node.  Need to rule out cancerous process.  We will send off a serum protein electrophoresis also.  I called the patient to give an update that we will set him up for a biopsy for this tomorrow morning.  I tried to call the patient's wife and left a message.  We will also consult hematology oncology.  Dr Loletha Grayer

## 2020-03-05 NOTE — Progress Notes (Signed)
Patient ID: Troy Shepherd, male   DOB: 09/09/1946, 74 y.o.   MRN: [id number] [facility] PROGRESS NOTE  Vito Beg GQQ:[id number] DOB: 01/31/1946 DOA: 03/04/2020 PCP: System, Pcp Not In  HPI/Subjective: Patient feeling a little bit better today.  Still with some shortness of breath and cough.  Pulse ox at rest dropped down to 79%.  Objective: Vitals:   03/05/20 0900 03/05/20 0914  BP:    Pulse:    Resp:    Temp:    SpO2: (!) 79% 93%    Intake/Output Summary (Last 24 hours) at 03/05/2020 1232 Last data filed at 03/05/2020 1013 Gross per 24 hour  Intake 256.77 ml  Output 200 ml  Net 56.77 ml   Filed Weights   03/04/20 1210  Weight: 63.5 kg    ROS: Review of Systems  Constitutional: Negative for fever.  Eyes: Negative for blurred vision.  Respiratory: Positive for cough and shortness of breath.   Cardiovascular: Negative for chest pain.  Gastrointestinal: Negative for abdominal pain, constipation, diarrhea, nausea and vomiting.  Genitourinary: Negative for dysuria.  Musculoskeletal: Positive for joint pain.  Neurological: Negative for headaches.   Exam: Physical Exam  Constitutional: He is oriented to person, place, and time.  HENT:  Nose: No mucosal edema.  Mouth/Throat: No oropharyngeal exudate or posterior oropharyngeal edema.  Eyes: Conjunctivae and lids are normal.  Neck: Carotid bruit is not present.  Cardiovascular: S1 normal and S2 normal. Exam reveals no gallop.  No murmur heard. Respiratory: No respiratory distress. He has decreased breath sounds in the right middle field, the right lower field, the left middle field and the left lower field. He has no wheezes. He has no rhonchi. He has no rales.  GI: Soft. Bowel sounds are normal. There is no abdominal tenderness.  Musculoskeletal:     Right ankle: No swelling.     Left ankle: No swelling.  Lymphadenopathy:    He has no cervical adenopathy.  Neurological: He is alert and oriented to person, place, and  time. No cranial nerve deficit.  Skin: Skin is warm. No rash noted. Nails show clubbing.  Psychiatric: He has a normal mood and affect.      Data Reviewed: Basic Metabolic Panel: Recent Labs  Lab 03/04/20 1219 03/05/20 0546  NA 135 133*  K 3.5 3.6  CL 101 102  CO2 21* 21*  GLUCOSE 96 114*  BUN 29* 36*  CREATININE 1.43* 1.28*  CALCIUM 10.8* 9.4   CBC: Recent Labs  Lab 03/04/20 1219 03/05/20 0546  WBC 11.3* 13.2*  HGB 14.5 13.1  HCT 42.0 38.8*  MCV 95.9 95.8  PLT 329 315     Recent Results (from the past 240 hour(s))  Blood Culture (routine x 2)     Status: None (Preliminary result)   Collection Time: 03/04/20 12:18 PM   Specimen: BLOOD  Result Value Ref Range Status   Specimen Description BLOOD RARM AC  Final   Special Requests   Final    BOTTLES DRAWN AEROBIC AND ANAEROBIC Blood Culture adequate volume   Culture   Final    NO GROWTH < 24 HOURS Performed at [facility], [address] [place], [place] [address]    Report Status PENDING  Incomplete  Blood Culture (routine x 2)     Status: None (Preliminary result)   Collection Time: 03/04/20  3:16 PM   Specimen: BLOOD  Result Value Ref Range Status   Specimen Description BLOOD L HAND  Final   Special  Requests   Final    BOTTLES DRAWN AEROBIC AND ANAEROBIC Blood Culture adequate volume   Culture   Final    NO GROWTH < 24 HOURS Performed at [facility], [address] [place], [place] [address]    Report Status PENDING  Incomplete  Respiratory Panel by RT PCR (Flu A&B, Covid) - Nasopharyngeal Swab     Status: None   Collection Time: 03/04/20  3:17 PM   Specimen: Nasopharyngeal Swab  Result Value Ref Range Status   SARS Coronavirus 2 by RT PCR NEGATIVE NEGATIVE Final    Comment: (NOTE) SARS-CoV-2 target nucleic acids are NOT DETECTED. The SARS-CoV-2 RNA is generally detectable in upper respiratoy specimens during the acute phase of infection. The lowest concentration of  SARS-CoV-2 viral copies this assay can detect is 131 copies/mL. A negative result does not preclude SARS-Cov-2 infection and should not be used as the sole basis for treatment or other patient management decisions. A negative result may occur with  improper specimen collection/handling, submission of specimen other than nasopharyngeal swab, presence of viral mutation(s) within the areas targeted by this assay, and inadequate number of viral copies (<131 copies/mL). A negative result must be combined with clinical observations, patient history, and epidemiological information. The expected result is Negative. Fact Sheet for Patients:  PinkCheek.be Fact Sheet for Healthcare Providers:  GravelBags.it This test is not yet ap proved or cleared by the [place] FDA and  has been authorized for detection and/or diagnosis of SARS-CoV-2 by FDA under an Emergency Use Authorization (EUA). This EUA will remain  in effect (meaning this test can be used) for the duration of the COVID-19 declaration under Section 564(b)(1) of the Act, 21 U.S.C. section 360bbb-3(b)(1), unless the authorization is terminated or revoked sooner.    Influenza A by PCR NEGATIVE NEGATIVE Final   Influenza B by PCR NEGATIVE NEGATIVE Final    Comment: (NOTE) The Xpert Xpress SARS-CoV-2/FLU/RSV assay is intended as an aid in  the diagnosis of influenza from Nasopharyngeal swab specimens and  should not be used as a sole basis for treatment. Nasal washings and  aspirates are unacceptable for Xpert Xpress SARS-CoV-2/FLU/RSV  testing. Fact Sheet for Patients: PinkCheek.be Fact Sheet for Healthcare Providers: GravelBags.it This test is not yet approved or cleared by the [place] FDA and  has been authorized for detection and/or diagnosis of SARS-CoV-2 by  FDA under an Emergency Use Authorization (EUA).  This EUA will remain  in effect (meaning this test can be used) for the duration of the  Covid-19 declaration under Section 564(b)(1) of the Act, 21  U.S.C. section 360bbb-3(b)(1), unless the authorization is  terminated or revoked. Performed at [facility], [address] [place], [place] [address]      Studies: [facility] Chest [place] 1 View  Result Date: 03/04/2020 CLINICAL DATA:  Pt presents to ED via POV with c/o cough, and SOB. Pt states when he coughs he has substernal CP. Upon arrival to ED pt's RA sats 85-87% on RA. Pt also c/o dyspnea on exertion. Pt placed on 2L via [place] at this time. EXAM: PORTABLE CHEST 1 VIEW COMPARISON:  Chest radiograph 03/28/2018 FINDINGS: Stable cardiomediastinal contours. The heart size. Lungs are hyperinflated. There are new infiltrates throughout the bilateral lower lungs concerning for infection. Background chronic bronchitic change. No pneumothorax or significant pleural effusion. No acute finding in the visualized skeleton. IMPRESSION: New infiltrates throughout the bilateral lower lungs concerning for infection, less likely edema. Recommend radiographic follow-up to  resolution. Electronically Signed   By: Audie Pinto M.D.   On: 03/04/2020 14:12    Scheduled Meds: . [START ON 03/06/2020] amLODipine  10 mg Oral Daily  . aspirin EC  81 mg Oral Daily  . azithromycin  250 mg Oral Daily  . benzonatate  100 mg Oral TID  . dexamethasone (DECADRON) injection  6 mg Intravenous Q24H  . enoxaparin (LOVENOX) injection  40 mg Subcutaneous Q24H  . feeding supplement (ENSURE ENLIVE)  237 mL Oral TID BM  . folic acid  1 mg Oral Daily  . ipratropium-albuterol  3 mL Inhalation Q6H  . multivitamin with minerals  1 tablet Oral Daily  . thiamine  100 mg Oral Daily   Continuous Infusions: . sodium chloride 50 mL/hr at 03/05/20 1216  . cefTRIAXone (ROCEPHIN)  IV 2 g (03/05/20 0905)    Assessment/Plan:  1. Bilateral lobar pneumonia.  Continue Rocephin Zithromax  and Decadron.  We will repeat Covid test. 2. Acute hypoxic respiratory failure with pulse ox of 79% on room air today.  Continue oxygen supplementation.  May end up needing oxygen at home. 3. Essential hypertension.  Continue Norvasc daily 4. Elevated D-dimer.  Prior abnormal CT scan of the chest.  Will get CT scan of the chest with contrast to rule out pulmonary embolism. 5. Elevated troponin likely demand ischemia with acute hypoxic respiratory failure 6. Rheumatoid arthritis 7. Acute kidney injury on chronic kidney disease stage II.  Improved with fluid bolus yesterday.  Since I am getting a CAT scan of the chest I will give gentle IV fluids overnight. 8. Severe protein calorie malnutrition  Code Status:     Code Status Orders  (From admission, onward)         Start     Ordered   03/04/20 1652  Full code  Continuous     03/04/20 1652        Code Status History    Date Active Date Inactive Code Status Order ID Comments User Context   03/27/2018 2153 03/29/2018 1630 Full Code [id number]  Idelle Crouch, MD Inpatient   05/09/2017 1455 05/11/2017 2000 Full Code [id number]  Idelle Crouch, MD Inpatient   01/06/2016 2330 01/07/2016 1918 Full Code [id number]  Lance Coon, MD Inpatient   Advance Care Planning Activity     Family Communication: Spoke with the patient's wife on the phone Disposition Plan: Since the patient came in not wearing oxygen my goal is to try to get him home not wearing oxygen.  With the patient's pulse ox dropping down to 79% on room air, there could be a high likelihood of him needing oxygen at home.  CT scan of the chest today for further evaluation of lungs.  Likely will need a few more days of treatment here in the hospital for IV antibiotics and steroid.  I will repeat his Covid test.  Antibiotics:  Rocephin  Zithromax  Time spent: 28 minutes  [place]

## 2020-03-05 NOTE — Evaluation (Signed)
Physical Therapy Evaluation Patient Details Name: Troy Shepherd MRN: [id number] DOB: Apr 25, 1946 Today's Date: 03/05/2020   History of Present Illness  Per MD notes: PT is a 74 y.o. male presents to ED on 03/04/2020 with chest pain and cough. PMH includes HTN, and alcohol abuse. Current MD assessment includes bilateral pneumonia, acute hypoxic resp failure, HTN, and elevated troponin likely demand ischemia.   Clinical Impression  Pt pleasant and motivated to participate during the session. Pt was found on 1LO2/min with a SpO2 of 86-87%, nsg notified. Per nsg, okay to increase O2 to 2L/min- pt SpO2 was >90%. Pt was modified ind with bed mobility and transfers and was able to amb 180 feet without an AD with a SpO2 of 93% afterwards. Pt had some signs of decreased balance when ambulating and occasionally seemed a little bit unsteady on his feet. When formally accessing balance, pt had difficulty with reaching tasks outside of his BOS with his feet in a staggered and/or tandem position. Pt was educated on the importance of balance and that he is currently at an increased risks for falls based on his performance. Pt will benefit from HHPT services upon discharge to safely address deficits listed in patient problem list for decreased caregiver assistance and eventual return to PLOF.        Follow Up Recommendations Home health PT    Equipment Recommendations  None recommended by PT    Recommendations for Other Services       Precautions / Restrictions        Mobility  Bed Mobility Overal bed mobility: Modified Independent             General bed mobility comments: Extra time and effort, no physical assistance needed  Transfers Overall transfer level: Modified independent   Transfers: Sit to/from Stand Sit to Stand: Modified independent (Device/Increase time)         General transfer comment: no physical assistance needed. Pt required extra time and  effort  Ambulation/Gait Ambulation/Gait assistance: Supervision Gait Distance (Feet): 180 Feet x1, 20 Feet x1 Assistive device: (Pt started by doing a 20' walk with a RW. Second walk was without an AD and 180 feet) Gait Pattern/deviations: Decreased step length - right;Decreased step length - left;Scissoring Gait velocity: decreased   General Gait Details: decrease step length, no arm swing, occasional sissoring. No LOB  Stairs            Wheelchair Mobility    Modified Rankin (Stroke Patients Only)       Balance Overall balance assessment: Needs assistance Sitting-balance support: No upper extremity supported;Feet unsupported Sitting balance-Leahy Scale: Normal Sitting balance - Comments: No deficits noted with sitting balance   Standing balance support: No upper extremity supported;During functional activity Standing balance-Leahy Scale: Good Standing balance comment: with static standing and with amb, pt did not require UE support to maintain balance     Tandem Stance - Right Leg: (Occasional LOB require UE assist to prevent from falling) Tandem Stance - Left Leg: (Occasional LOB require UE assist to prevent from falling) [place] - Eyes Opened: (WNL) [place] - Eyes Closed: (WNL)   High Level Balance Comments: Tandem stance with UE reaching task - pt had several instances of LOB and had difficulty reaching outside of his BOS in tandem stance             Pertinent Vitals/Pain Pain Assessment: No/denies pain    Home Living Family/patient expects to be discharged to:: Private residence Living Arrangements: Spouse/significant other Available  Help at Discharge: Available 24 hours/day;Family Type of Home: Apartment Home Access: Level entry     Home Layout: One level Home Equipment: Grab bars - toilet;Grab bars - tub/shower;Toilet riser      Prior Function Level of Independence: Independent         Comments: Community amb, ind with ADL's, No AD use,  denies any falls in past 6 months     Hand Dominance        Extremity/Trunk Assessment        Lower Extremity Assessment Lower Extremity Assessment: Generalized weakness       Communication   Communication: No difficulties  Cognition Arousal/Alertness: Awake/alert Behavior During Therapy: WFL for tasks assessed/performed Overall Cognitive Status: Within Functional Limits for tasks assessed                                        General Comments      Exercises Total Joint Exercises Ankle Circles/Pumps: AROM;Strengthening;Both;10 reps Quad Sets: Strengthening;Both;10 reps Long Arc Quad: AROM;Strengthening;Both;10 reps Marching in Standing: AROM;Strengthening;Both;10 reps Other Exercises Other Exercises: UE reaching tasks in tandem stance Other Exercises: Breath with exertion training   Assessment/Plan    PT Assessment Patient needs continued PT services  PT Problem List Decreased strength;Decreased mobility;Decreased range of motion;Decreased activity tolerance;Cardiopulmonary status limiting activity;Decreased balance       PT Treatment Interventions DME instruction;Therapeutic exercise;Gait training;Balance training;Stair training;Functional mobility training;Therapeutic activities    PT Goals (Current goals can be found in the Care Plan section)  Acute Rehab PT Goals Patient Stated Goal: Move around and get home PT Goal Formulation: With patient Time For Goal Achievement: 03/18/20 Potential to Achieve Goals: Good    Frequency Min 2X/week   Barriers to discharge        Co-evaluation               AM-PAC PT "6 Clicks" Mobility  Outcome Measure Help needed turning from your back to your side while in a flat bed without using bedrails?: None Help needed moving from lying on your back to sitting on the side of a flat bed without using bedrails?: None Help needed moving to and from a bed to a chair (including a wheelchair)?: A  Little Help needed standing up from a chair using your arms (e.g., wheelchair or bedside chair)?: A Little Help needed to walk in hospital room?: A Little Help needed climbing 3-5 steps with a railing? : A Little 6 Click Score: 20    End of Session Equipment Utilized During Treatment: Gait belt;Oxygen Activity Tolerance: Patient tolerated treatment well Patient left: in chair;with call bell/phone within reach Nurse Communication: Mobility status(Nsg notified of inital O2 Sat, and SpO2 levels after amb. Nsg notified of tangled telemtry wires) PT Visit Diagnosis: Muscle weakness (generalized) (M62.81);Difficulty in walking, not elsewhere classified (R26.2);Unsteadiness on feet (R26.81)    Time: 1749-4496 PT Time Calculation (min) (ACUTE ONLY): 51 min   Charges:              Annabelle Harman, SPT 03/05/20 2:38 PM

## 2020-03-06 ENCOUNTER — Encounter: Payer: Self-pay | Admitting: Internal Medicine

## 2020-03-06 ENCOUNTER — Telehealth: Payer: Self-pay | Admitting: Internal Medicine

## 2020-03-06 DIAGNOSIS — J441 Chronic obstructive pulmonary disease with (acute) exacerbation: Secondary | ICD-10-CM

## 2020-03-06 DIAGNOSIS — R079 Chest pain, unspecified: Secondary | ICD-10-CM

## 2020-03-06 DIAGNOSIS — J189 Pneumonia, unspecified organism: Secondary | ICD-10-CM

## 2020-03-06 DIAGNOSIS — R918 Other nonspecific abnormal finding of lung field: Secondary | ICD-10-CM

## 2020-03-06 DIAGNOSIS — R937 Abnormal findings on diagnostic imaging of other parts of musculoskeletal system: Secondary | ICD-10-CM

## 2020-03-06 DIAGNOSIS — R519 Headache, unspecified: Secondary | ICD-10-CM

## 2020-03-06 DIAGNOSIS — R591 Generalized enlarged lymph nodes: Secondary | ICD-10-CM

## 2020-03-06 DIAGNOSIS — R05 Cough: Secondary | ICD-10-CM

## 2020-03-06 DIAGNOSIS — R9389 Abnormal findings on diagnostic imaging of other specified body structures: Secondary | ICD-10-CM

## 2020-03-06 DIAGNOSIS — F17210 Nicotine dependence, cigarettes, uncomplicated: Secondary | ICD-10-CM

## 2020-03-06 DIAGNOSIS — I10 Essential (primary) hypertension: Secondary | ICD-10-CM

## 2020-03-06 DIAGNOSIS — R590 Localized enlarged lymph nodes: Secondary | ICD-10-CM

## 2020-03-06 LAB — BASIC METABOLIC PANEL
Anion gap: 9 (ref 5–15)
BUN: 38 mg/dL — ABNORMAL HIGH (ref 8–23)
CO2: 25 mmol/L (ref 22–32)
Calcium: 10.4 mg/dL — ABNORMAL HIGH (ref 8.9–10.3)
Chloride: 103 mmol/L (ref 98–111)
Creatinine, Ser: 1.2 mg/dL (ref 0.61–1.24)
GFR calc Af Amer: >60 mL/min (ref >60)
GFR calc non Af Amer: 59 mL/min — ABNORMAL LOW (ref >60)
Glucose, Bld: 120 mg/dL — ABNORMAL HIGH (ref 70–99)
Potassium: 4.1 mmol/L (ref 3.5–5.1)
Sodium: 137 mmol/L (ref 135–145)

## 2020-03-06 LAB — SARS CORONAVIRUS 2 (TAT 6-24 HRS): SARS Coronavirus 2: NEGATIVE

## 2020-03-06 MED ORDER — MORPHINE SULFATE (PF) 2 MG/ML IV SOLN
2.0000 mg | INTRAVENOUS | Status: DC | PRN
  Administered 2020-03-06 – 2020-03-07 (×3): 2 mg via INTRAVENOUS
  Filled 2020-03-06 (×3): qty 1

## 2020-03-06 NOTE — Progress Notes (Signed)
SATURATION QUALIFICATIONS: (This note is used to comply with regulatory documentation for home oxygen)  Patient Saturations on Room Air at Rest = 92%  Patient Saturations on Room Air while Ambulating = 83%  Patient Saturations on 2 Liters of oxygen while Ambulating = 94%  Please briefly explain why patient needs home oxygen: 

## 2020-03-06 NOTE — Assessment & Plan Note (Addendum)
#  74 year old male patient currently in the hospital for worsening shortness of breath; CT scan chest shows bulky mediastinal/hilar adenopathy/rib lytic lesions;   # Bulky mediastinal adenopathy; hilar adenopathy supraclavicular adenopathy with lytic lesions of the ribs-highly suspicious for malignant process-awaiting biopsy this morning.  #COPD/pneumonia-on antibiotics/steroids-slightly improved.  #Chest wall pain-likely secondary to bone metastases-stable; continue pain management  #Disposition-patient potentially can be discharged post biopsy; will follow up in the cancer center clinic early next week.   Addendum: Discussed with Dr. Reuel Derby; touch preps suggestive of malignancy.  Further work-up in progress.

## 2020-03-06 NOTE — Progress Notes (Signed)
Chief Complaint: Patient was seen in consultation today for LN biopsy  Referring Physician(s): Dr. Leslye Peer  Supervising Physician: Dr. Earleen Newport  Patient Status: [facility] - In-pt  History of Present Illness: Troy Shepherd is a 74 y.o. male admitted with chest discomfort and cough. He has been worked up and found to have bulky mediastinal and bilateral hilar adenopathy with nodular opacities throughout the chest and numerous areas of lytic change throughout the visualized skeleton. Findings concerning for primary pulmonary neoplasm or lymphoproliferative disorder with diffuse involvement/metastatic disease and with superimposed infection in the chest. Resp viral panel is negative including COVID. IR is asked to perform LN biopsy for tissue diagnosis. PMHx, meds, labs, imaging, allergies reviewed. Feels better today since admission. Denies SOB.     Past Medical History:  Diagnosis Date  . Alcohol abuse    quit 09/2014  . Arthritis   . Hypertension   . Leukopenia    resolved when stopped drinking 09/2014    Past Surgical History:  Procedure Laterality Date  . APPENDECTOMY    . COLONOSCOPY N/A 11/09/2015   Procedure: COLONOSCOPY;  Surgeon: Josefine Class, MD;  Location: [facility] ENDOSCOPY;  Service: Endoscopy;  Laterality: N/A;    Allergies: Patient has no known allergies.  Medications:  Current Facility-Administered Medications:  .  acetaminophen (TYLENOL) tablet 650 mg, 650 mg, Oral, Q6H PRN, 650 mg at 03/05/20 1958 **OR** acetaminophen (TYLENOL) suppository 650 mg, 650 mg, Rectal, Q6H PRN, Wieting, Richard, MD .  amLODipine (NORVASC) tablet 10 mg, 10 mg, Oral, Daily, Wieting, Richard, MD, 10 mg at 03/06/20 1006 .  [COMPLETED] azithromycin (ZITHROMAX) tablet 500 mg, 500 mg, Oral, Daily, 500 mg at 03/04/20 1814 **FOLLOWED BY** azithromycin (ZITHROMAX) tablet 250 mg, 250 mg, Oral, Daily, Wieting, Richard, MD, 250 mg at 03/06/20 1006 .  benzonatate (TESSALON) capsule 100 mg,  100 mg, Oral, TID, Leslye Peer, Richard, MD, 100 mg at 03/06/20 1006 .  cefTRIAXone (ROCEPHIN) 2 g in sodium chloride 0.9 % 100 mL IVPB, 2 g, Intravenous, Q24H, Wieting, Richard, MD, Last Rate: 200 mL/hr at 03/06/20 1012, 2 g at 03/06/20 1012 .  dexamethasone (DECADRON) injection 6 mg, 6 mg, Intravenous, Q24H, Wieting, Richard, MD, 6 mg at 03/06/20 1007 .  feeding supplement (ENSURE ENLIVE) (ENSURE ENLIVE) liquid 237 mL, 237 mL, Oral, TID BM, Wieting, Richard, MD, 237 mL at 91/63/84 6659 .  folic acid (FOLVITE) tablet 1 mg, 1 mg, Oral, Daily, Wieting, Richard, MD, 1 mg at 03/06/20 1006 .  guaiFENesin (ROBITUSSIN) 100 MG/5ML solution 100 mg, 5 mL, Oral, Q4H PRN, Wieting, Richard, MD .  ipratropium-albuterol (DUONEB) 0.5-2.5 (3) MG/3ML nebulizer solution 3 mL, 3 mL, Inhalation, Q6H PRN, Wieting, Richard, MD .  morphine 2 MG/ML injection 2 mg, 2 mg, Intravenous, Q4H PRN, Lang Snow, NP, 2 mg at 03/06/20 0805 .  multivitamin with minerals tablet 1 tablet, 1 tablet, Oral, Daily, Loletha Grayer, MD, 1 tablet at 03/06/20 1006 .  ondansetron (ZOFRAN) tablet 4 mg, 4 mg, Oral, Q6H PRN **OR** ondansetron (ZOFRAN) injection 4 mg, 4 mg, Intravenous, Q6H PRN, Loletha Grayer, MD, 4 mg at 03/05/20 2119 .  thiamine tablet 100 mg, 100 mg, Oral, Daily, Leslye Peer, Richard, MD, 100 mg at 03/06/20 1006    Family History  Problem Relation Age of Onset  . COPD Sister     Social History   Socioeconomic History  . Marital status: Married    Spouse name: Not on file  . Number of children: Not on file  . Years of  education: Not on file  . Highest education level: Not on file  Occupational History  . Not on file  Tobacco Use  . Smoking status: Current Every Day Smoker    Packs/day: 0.10    Years: 40.00    Pack years: 4.00    Types: Cigarettes  . Smokeless tobacco: Never Used  Substance and Sexual Activity  . Alcohol use: Yes    Alcohol/week: 3.0 standard drinks    Types: 3 Shots of liquor per  week    Comment: per pt drinks twice/d 5 days a wk  . Drug use: No  . Sexual activity: Not on file  Other Topics Concern  . Not on file  Social History Narrative   With wife; [place]. 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    Social Determinants of Health   Financial Resource Strain:   . Difficulty of Paying Living Expenses: Not on file  Food Insecurity:   . Worried About Charity fundraiser in the Last Year: Not on file  . Ran Out of Food in the Last Year: Not on file  Transportation Needs:   . Lack of Transportation (Medical): Not on file  . Lack of Transportation (Non-Medical): Not on file  Physical Activity:   . Days of Exercise per Week: Not on file  . Minutes of Exercise per Session: Not on file  Stress:   . Feeling of Stress : Not on file  Social Connections:   . Frequency of Communication with Friends and Family: Not on file  . Frequency of Social Gatherings with Friends and Family: Not on file  . Attends Religious Services: Not on file  . Active Member of Clubs or Organizations: Not on file  . Attends Archivist Meetings: Not on file  . Marital Status: Not on file     Review of Systems: A 12 point ROS discussed and pertinent positives are indicated in the HPI above.  All other systems are negative.  Review of Systems  Vital Signs: BP (!) 146/81 (BP Location: Right Arm)   Pulse 79   Temp 98.1 F (36.7 C) (Oral)   Resp 19   Ht 5\' 7"  (1.702 m)   Wt 63.5 kg   SpO2 100%   BMI 21.93 kg/m   Physical Exam Constitutional:      Appearance: He is well-developed. He is not ill-appearing.  HENT:     Mouth/Throat:     Mouth: Mucous membranes are moist.     Pharynx: Oropharynx is clear.  Cardiovascular:     Rate and Rhythm: Normal rate and regular rhythm.     Heart sounds: Normal heart sounds.  Pulmonary:     Effort: Pulmonary effort is normal. No respiratory distress.     Breath sounds: Normal breath sounds.  Skin:    General:  Skin is warm and dry.  Neurological:     General: No focal deficit present.     Mental Status: He is alert and oriented to person, place, and time.  Psychiatric:        Mood and Affect: Mood normal.        Thought Content: Thought content normal.        Judgment: Judgment normal.      Imaging: CT ANGIO CHEST PE W OR WO CONTRAST  Result Date: 03/05/2020 CLINICAL DATA:  Shortness of breath bilateral pneumonia, elevated D-dimer EXAM: CT ANGIOGRAPHY CHEST WITH CONTRAST TECHNIQUE: Multidetector CT imaging of the chest was performed using the  standard protocol during bolus administration of intravenous contrast. Multiplanar CT image reconstructions and MIPs were obtained to evaluate the vascular anatomy. CONTRAST:  39mL OMNIPAQUE IOHEXOL 350 MG/ML SOLN COMPARISON:  Chest x-ray of 03/04/2020 FINDINGS: Cardiovascular: Calcified and noncalcified plaque throughout the thoracic aorta. No signs of acute aortic process. Small pericardial effusion. Heart size is normal. No signs of acute pulmonary embolism. Mediastinum/Nodes: Bulky mediastinal adenopathy. (Image 39, series 4): 2.9 cm AP window lymph node. (Image 38, series 4): 2.4 cm right paratracheal lymph node. Lymph nodes are seen throughout all nodal stations in the chest including the bilateral hila. Right hilar nodal tissue approximately 1.7 cm (image 50, series 4) anterior mediastinal/prevascular lymph nodes with enlargement as well. High AP window lymph node just below pre-vascular lymph nodes (image 39, series 4) 1.8 cm. Esophagus is grossly normal. No signs of frank absolute ir E adenopathy. 17 mm left thoracic inlet lymph node and similarly sized right thoracic inlet lymph nodes. Ovoid lymph node without fatty hilum in the left axilla measuring 10 mm. Lungs/Pleura: Nodular opacities throughout the chest with a background of ground-glass attenuation and septal thickening, largest area measuring 2.3 x 2.1 cm in the right lower lobe this shows low-density.  Consolidative changes and volume loss in the right middle lobe. Narrowing of central airways at the right hilum also in the left infrahilar region. Patchy opacities bilaterally throughout the chest with nodular features none as discrete as the area in the right lung base. Small right pleural effusion layers dependently. Upper Abdomen: No signs of acute finding in the upper abdomen. Musculoskeletal: Numerous areas of lytic change throughout the visualized skeleton, particularly the spine. These are too numerous to count with a focal area as an example on image 23 of series 4 measuring 11 mm. No signs of acute fracture. Review of the MIP images confirms the above findings. IMPRESSION: 1. No evidence of acute pulmonary embolism. 2. Bulky mediastinal and bilateral hilar adenopathy with nodular opacities throughout the chest and numerous areas of lytic change throughout the visualized skeleton. Findings concerning for primary pulmonary neoplasm or lymphoproliferative disorder with diffuse involvement/metastatic disease and with superimposed infection in the chest. 3. There was some mild nodal enlargement on previous exams. This is much worse and findings of diffuse bony lytic changes are also a new finding. 4. Some airway narrowing at the hila bilaterally worse on the right with some mild bronchial wall thickening. Superimposed infection may be due to postobstructive changes. 5. Small right pleural effusion. 6. Small pericardial effusion. 7. Aortic atherosclerosis. Aortic Atherosclerosis (ICD10-I70.0). Electronically Signed   By: Zetta Bills M.D.   On: 03/05/2020 13:27   DG Chest Port 1 View  Result Date: 03/04/2020 CLINICAL DATA:  Pt presents to ED via POV with c/o cough, and SOB. Pt states when he coughs he has substernal CP. Upon arrival to ED pt's RA sats 85-87% on RA. Pt also c/o dyspnea on exertion. Pt placed on 2L via [place] at this time. EXAM: PORTABLE CHEST 1 VIEW COMPARISON:  Chest radiograph 03/28/2018  FINDINGS: Stable cardiomediastinal contours. The heart size. Lungs are hyperinflated. There are new infiltrates throughout the bilateral lower lungs concerning for infection. Background chronic bronchitic change. No pneumothorax or significant pleural effusion. No acute finding in the visualized skeleton. IMPRESSION: New infiltrates throughout the bilateral lower lungs concerning for infection, less likely edema. Recommend radiographic follow-up to resolution. Electronically Signed   By: Audie Pinto M.D.   On: 03/04/2020 14:12    Labs:  CBC: Recent  Labs    03/09/19 0653 03/04/20 1219 03/05/20 0546  WBC 4.7 11.3* 13.2*  HGB 12.4* 14.5 13.1  HCT 37.7* 42.0 38.8*  PLT 321 329 315    COAGS: Recent Labs    03/05/20 1551  INR 1.2    BMP: Recent Labs    03/09/19 0653 03/04/20 1219 03/05/20 0546 03/06/20 0527  NA 136 135 133* 137  K 3.9 3.5 3.6 4.1  CL 106 101 102 103  CO2 21* 21* 21* 25  GLUCOSE 81 96 114* 120*  BUN 20 29* 36* 38*  CALCIUM 9.0 10.8* 9.4 10.4*  CREATININE 1.18 1.43* 1.28* 1.20  GFRNONAA >60 48* 55* 59*  GFRAA >60 56* >60 >60    LIVER FUNCTION TESTS: Recent Labs    03/09/19 0653  BILITOT 0.5  AST 16  ALT 7  ALKPHOS 64  PROT 8.0  ALBUMIN 3.6    TUMOR MARKERS: No results for input(s): AFPTM, CEA, CA199, CHROMGRNA in the last 8760 hours.  Assessment and Plan: Chest and neck adenopathy with underlying pulm nodules concerning for metastatic process Imaging reviewed. Plan for US guided LN bx on Wed 3/10 NPO p MN Risks and benefits of LN bx was discussed with the patient and/or patient's family including, but not limited to bleeding, infection, damage to adjacent structures or low yield requiring additional tests.  All of the questions were answered and there is agreement to proceed.  Consent signed and in chart.    Thank you for this interesting consult.  I greatly enjoyed meeting Troy Shepherd and look forward to participating in their  care.  A copy of this report was sent to the requesting provider on this date.  Electronically Signed: Ascencion Dike, PA-C 03/06/2020, 10:37 AM   I spent a total of 20 minutes in face to face in clinical consultation, greater than 50% of which was counseling/coordinating care for LN bx

## 2020-03-06 NOTE — TOC Initial Note (Signed)
Transition of Care [facility]) - Initial/Assessment Note    Patient Details  Name: Troy Shepherd MRN: [id number] Date of Birth: 08/05/1946  [facility] CM/SW Contact:    Shelbie Ammons, RN Phone Number: 03/06/2020, 11:26 AM  Clinical Narrative:       RNCM assessed patient at bedside. Patient lying in bed with oxygen in place, alert and verbally appropriate. Patient was admitted to hospital due to shortness of breath and diagnosed with Pneumonia but is continued medical work up and is scheduled to have US guided biopsy tomorrow. Patient reports to normally being independent at home but does feel much weaker now. He lives with his wife and typically does not use any equipment in the home. He uses [employer], research (physical sciences).  RNCM discussed with patient that therapy is recommending he have PT at home. Patient reports that he would be open to this and does not have a preference as to which agency as long as they are in network with his insurance. Patient is unsure when the last time was he saw his MD but reports (by description of location) that he goes to [facility].  RNCM placed call to office and did verify he is a current patient of theirs and he sees Dr. Ladoris Gene.  RNCM placed call to [place] with [facility] for SN, PT, and OT, MD aware        Expected Discharge Plan: [place] Barriers to Discharge: Continued Medical Work up   Patient Goals and CMS Choice        Expected Discharge Plan and Services Expected Discharge Plan: [place]   Discharge Planning Services: CM Consult Post Acute Care Choice: [place] arrangements for the past 2 months: Apartment                                      Prior Living Arrangements/Services Living arrangements for the past 2 months: Apartment Lives with:: Spouse Patient language and need for interpreter reviewed:: Yes Do you feel safe going back to the place where you  live?: Yes      Need for Family Participation in Patient Care: Yes (Comment) Care giver support system in place?: Yes (comment)   Criminal Activity/Legal Involvement Pertinent to Current Situation/Hospitalization: No - Comment as needed  Activities of Daily Living Home Assistive Devices/Equipment: None ADL Screening (condition at time of admission) Patient's cognitive ability adequate to safely complete daily activities?: Yes Is the patient deaf or have difficulty hearing?: No Does the patient have difficulty seeing, even when wearing glasses/contacts?: No Does the patient have difficulty concentrating, remembering, or making decisions?: No Patient able to express need for assistance with ADLs?: Yes Does the patient have difficulty dressing or bathing?: No Independently performs ADLs?: Yes (appropriate for developmental age) Does the patient have difficulty walking or climbing stairs?: Yes Weakness of Legs: Both Weakness of Arms/Hands: None  Permission Sought/Granted                  Emotional Assessment Appearance:: Appears stated age Attitude/Demeanor/Rapport: Engaged Affect (typically observed): Appropriate Orientation: : Oriented to Self, Oriented to Place, Oriented to  Time, Oriented to Situation Alcohol / Substance Use: Not Applicable Psych Involvement: No (comment)  Admission diagnosis:  Cough [R05] Bilateral pneumonia [J18.9] Chest pain [R07.9] Community acquired pneumonia, unspecified laterality [J18.9] Patient Active Problem List   Diagnosis Date Noted  .  Mediastinal lymphadenopathy 03/06/2020  . Protein-calorie malnutrition, severe 03/05/2020  . Elevated d-dimer   . Bilateral pneumonia 03/04/2020  . Acute respiratory failure with hypoxia ([place])   . Elevated troponin   . 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 Tobacco abuse   . Tobacco abuse counseling   . Chest pain 03/27/2018  . Abnormal EKG 03/27/2018  . Anemia 03/27/2018  .  Acute viral syndrome 05/09/2017  . Tick bite of groin 05/09/2017  . Generalized weakness 05/09/2017  . Ambulatory dysfunction 05/09/2017  . Alcohol dependence ([place]) 03/05/2017  . Sepsis ([place]) 01/06/2016  . Hypokalemia 01/06/2016  . HTN (hypertension) 01/06/2016  . Nausea and vomiting 01/06/2016  . Dehydration 01/06/2016   PCP:  System, Pcp Not In Pharmacy:   [address] [place] [address] Phone: [phone] Fax: [phone]     Social Determinants of Health (SDOH) Interventions    Readmission Risk Interventions No flowsheet data found.

## 2020-03-06 NOTE — Telephone Encounter (Signed)
Left voicemail for patient's wife to discuss patient's plan of care.

## 2020-03-06 NOTE — Progress Notes (Signed)
Patient ID: Troy Shepherd, male   DOB: 09-06-1946, 74 y.o.   MRN: [id number] [facility] PROGRESS NOTE  Troy Shepherd [id number] DOB: 09-02-1946 DOA: 03/04/2020 PCP: Theotis Burrow, MD  HPI/Subjective: Patient feeling a little bit better today.  Breathing a little bit easier.  Still coughing.  Interested in going home soon.  Objective: Vitals:   03/06/20 0535 03/06/20 1154  BP: (!) 146/81 138/83  Pulse: 79 86  Resp: 19   Temp: 98.1 F (36.7 C) 98.2 F (36.8 C)  SpO2: 100% 94%    Intake/Output Summary (Last 24 hours) at 03/06/2020 1157 Last data filed at 03/06/2020 0850 Gross per 24 hour  Intake 1091.93 ml  Output 400 ml  Net 691.93 ml   Filed Weights   03/04/20 1210  Weight: 63.5 kg    ROS: Review of Systems  Constitutional: Negative for fever.  Eyes: Negative for blurred vision.  Respiratory: Positive for cough and shortness of breath.   Cardiovascular: Negative for chest pain.  Gastrointestinal: Negative for abdominal pain, constipation, diarrhea, nausea and vomiting.  Genitourinary: Negative for dysuria.  Musculoskeletal: Positive for joint pain.  Neurological: Negative for headaches.   Exam: Physical Exam  Constitutional: He is oriented to person, place, and time.  HENT:  Nose: No mucosal edema.  Mouth/Throat: No oropharyngeal exudate or posterior oropharyngeal edema.  Eyes: Conjunctivae and lids are normal.  Neck: Carotid bruit is not present.  Cardiovascular: S1 normal and S2 normal. Exam reveals no gallop.  No murmur heard. Respiratory: No respiratory distress. He has decreased breath sounds in the right middle field, the right lower field, the left middle field and the left lower field. He has no wheezes. He has no rhonchi. He has no rales.  GI: Soft. There is no abdominal tenderness.  Musculoskeletal:     Right ankle: No swelling.     Left ankle: No swelling.  Lymphadenopathy:    He has no cervical adenopathy.  Neurological: He is alert  and oriented to person, place, and time. No cranial nerve deficit.  Skin: Skin is warm. No rash noted. Nails show clubbing.  Psychiatric: He has a normal mood and affect.      Data Reviewed: Basic Metabolic Panel: Recent Labs  Lab 03/04/20 1219 03/05/20 0546 03/06/20 0527  NA 135 133* 137  K 3.5 3.6 4.1  CL 101 102 103  CO2 21* 21* 25  GLUCOSE 96 114* 120*  BUN 29* 36* 38*  CREATININE 1.43* 1.28* 1.20  CALCIUM 10.8* 9.4 10.4*   CBC: Recent Labs  Lab 03/04/20 1219 03/05/20 0546  WBC 11.3* 13.2*  HGB 14.5 13.1  HCT 42.0 38.8*  MCV 95.9 95.8  PLT 329 315     Recent Results (from the past 240 hour(s))  Blood Culture (routine x 2)     Status: None (Preliminary result)   Collection Time: 03/04/20 12:18 PM   Specimen: BLOOD  Result Value Ref Range Status   Specimen Description BLOOD RARM [facility]  Final   Special Requests   Final    BOTTLES DRAWN AEROBIC AND ANAEROBIC Blood Culture adequate volume   Culture   Final    NO GROWTH 2 DAYS Performed at [facility], [address] [place], [place] [address]    Report Status PENDING  Incomplete  Blood Culture (routine x 2)     Status: None (Preliminary result)   Collection Time: 03/04/20  3:16 PM   Specimen: BLOOD  Result Value Ref Range Status   Specimen Description  BLOOD L HAND  Final   Special Requests   Final    BOTTLES DRAWN AEROBIC AND ANAEROBIC Blood Culture adequate volume   Culture   Final    NO GROWTH 2 DAYS Performed at [facility], [address], [place], [place] [address]    Report Status PENDING  Incomplete  Respiratory Panel by RT PCR (Flu A&B, Covid) - Nasopharyngeal Swab     Status: None   Collection Time: 03/04/20  3:17 PM   Specimen: Nasopharyngeal Swab  Result Value Ref Range Status   SARS Coronavirus 2 by RT PCR NEGATIVE NEGATIVE Final    Comment: (NOTE) SARS-CoV-2 target nucleic acids are NOT DETECTED. The SARS-CoV-2 RNA is generally detectable in upper  respiratoy specimens during the acute phase of infection. The lowest concentration of SARS-CoV-2 viral copies this assay can detect is 131 copies/mL. A negative result does not preclude SARS-Cov-2 infection and should not be used as the sole basis for treatment or other patient management decisions. A negative result may occur with  improper specimen collection/handling, submission of specimen other than nasopharyngeal swab, presence of viral mutation(s) within the areas targeted by this assay, and inadequate number of viral copies (<131 copies/mL). A negative result must be combined with clinical observations, patient history, and epidemiological information. The expected result is Negative. Fact Sheet for Patients:  PinkCheek.be Fact Sheet for Healthcare Providers:  GravelBags.it This test is not yet ap proved or cleared by the [place] FDA and  has been authorized for detection and/or diagnosis of SARS-CoV-2 by FDA under an Emergency Use Authorization (EUA). This EUA will remain  in effect (meaning this test can be used) for the duration of the COVID-19 declaration under Section 564(b)(1) of the Act, 21 U.S.C. section 360bbb-3(b)(1), unless the authorization is terminated or revoked sooner.    Influenza A by PCR NEGATIVE NEGATIVE Final   Influenza B by PCR NEGATIVE NEGATIVE Final    Comment: (NOTE) The Xpert Xpress SARS-CoV-2/FLU/RSV assay is intended as an aid in  the diagnosis of influenza from Nasopharyngeal swab specimens and  should not be used as a sole basis for treatment. Nasal washings and  aspirates are unacceptable for Xpert Xpress SARS-CoV-2/FLU/RSV  testing. Fact Sheet for Patients: PinkCheek.be Fact Sheet for Healthcare Providers: GravelBags.it This test is not yet approved or cleared by the [place] FDA and  has been authorized for  detection and/or diagnosis of SARS-CoV-2 by  FDA under an Emergency Use Authorization (EUA). This EUA will remain  in effect (meaning this test can be used) for the duration of the  Covid-19 declaration under Section 564(b)(1) of the Act, 21  U.S.C. section 360bbb-3(b)(1), unless the authorization is  terminated or revoked. Performed at [name], [place], [place] [address]   SARS CORONAVIRUS 2 (TAT 6-24 HRS) Nasopharyngeal Nasopharyngeal Swab     Status: None   Collection Time: 03/05/20  2:35 PM   Specimen: Nasopharyngeal Swab  Result Value Ref Range Status   SARS Coronavirus 2 NEGATIVE NEGATIVE Final    Comment: (NOTE) SARS-CoV-2 target nucleic acids are NOT DETECTED. The SARS-CoV-2 RNA is generally detectable in upper and lower respiratory specimens during the acute phase of infection. Negative results do not preclude SARS-CoV-2 infection, do not rule out co-infections with other pathogens, and should not be used as the sole basis for treatment or other patient management decisions. Negative results must be combined with clinical observations, patient history, and epidemiological information. The expected result is Negative.  Fact Sheet for Patients: SugarRoll.be Fact Sheet for Healthcare Providers: https://www.woods-mathews.com/ This test is not yet approved or cleared by the [place] FDA and  has been authorized for detection and/or diagnosis of SARS-CoV-2 by FDA under an Emergency Use Authorization (EUA). This EUA will remain  in effect (meaning this test can be used) for the duration of the COVID-19 declaration under Section 56 4(b)(1) of the Act, 21 U.S.C. section 360bbb-3(b)(1), unless the authorization is terminated or revoked sooner. Performed at [facility], [address], [place], [place] [address]      Studies: CT ANGIO CHEST PE W OR WO CONTRAST  Result Date: 03/05/2020 CLINICAL  DATA:  Shortness of breath bilateral pneumonia, elevated D-dimer EXAM: CT ANGIOGRAPHY CHEST WITH CONTRAST TECHNIQUE: Multidetector CT imaging of the chest was performed using the standard protocol during bolus administration of intravenous contrast. Multiplanar CT image reconstructions and MIPs were obtained to evaluate the vascular anatomy. CONTRAST:  83mL OMNIPAQUE IOHEXOL 350 MG/ML SOLN COMPARISON:  Chest x-ray of 03/04/2020 FINDINGS: Cardiovascular: Calcified and noncalcified plaque throughout the thoracic aorta. No signs of acute aortic process. Small pericardial effusion. Heart size is normal. No signs of acute pulmonary embolism. Mediastinum/Nodes: Bulky mediastinal adenopathy. (Image 39, series 4): 2.9 cm AP window lymph node. (Image 38, series 4): 2.4 cm right paratracheal lymph node. Lymph nodes are seen throughout all nodal stations in the chest including the bilateral hila. Right hilar nodal tissue approximately 1.7 cm (image 50, series 4) anterior mediastinal/prevascular lymph nodes with enlargement as well. High AP window lymph node just below pre-vascular lymph nodes (image 39, series 4) 1.8 cm. Esophagus is grossly normal. No signs of frank absolute ir E adenopathy. 17 mm left thoracic inlet lymph node and similarly sized right thoracic inlet lymph nodes. Ovoid lymph node without fatty hilum in the left axilla measuring 10 mm. Lungs/Pleura: Nodular opacities throughout the chest with a background of ground-glass attenuation and septal thickening, largest area measuring 2.3 x 2.1 cm in the right lower lobe this shows low-density. Consolidative changes and volume loss in the right middle lobe. Narrowing of central airways at the right hilum also in the left infrahilar region. Patchy opacities bilaterally throughout the chest with nodular features none as discrete as the area in the right lung base. Small right pleural effusion layers dependently. Upper Abdomen: No signs of acute finding in the upper  abdomen. Musculoskeletal: Numerous areas of lytic change throughout the visualized skeleton, particularly the spine. These are too numerous to count with a focal area as an example on image 23 of series 4 measuring 11 mm. No signs of acute fracture. Review of the MIP images confirms the above findings. IMPRESSION: 1. No evidence of acute pulmonary embolism. 2. Bulky mediastinal and bilateral hilar adenopathy with nodular opacities throughout the chest and numerous areas of lytic change throughout the visualized skeleton. Findings concerning for primary pulmonary neoplasm or lymphoproliferative disorder with diffuse involvement/metastatic disease and with superimposed infection in the chest. 3. There was some mild nodal enlargement on previous exams. This is much worse and findings of diffuse bony lytic changes are also a new finding. 4. Some airway narrowing at the hila bilaterally worse on the right with some mild bronchial wall thickening. Superimposed infection may be due to postobstructive changes. 5. Small right pleural effusion. 6. Small pericardial effusion. 7. Aortic atherosclerosis. Aortic Atherosclerosis (ICD10-I70.0). Electronically Signed   By: Zetta Bills M.D.   On: 03/05/2020 13:27   DG Chest [facility]  Result Date: 03/04/2020 CLINICAL DATA:  Pt presents to ED via POV with c/o cough, and SOB. Pt states when he coughs he has substernal CP. Upon arrival to ED pt's RA sats 85-87% on RA. Pt also c/o dyspnea on exertion. Pt placed on 2L via [employer] at this time. EXAM: PORTABLE CHEST 1 VIEW COMPARISON:  Chest radiograph 03/28/2018 FINDINGS: Stable cardiomediastinal contours. The heart size. Lungs are hyperinflated. There are new infiltrates throughout the bilateral lower lungs concerning for infection. Background chronic bronchitic change. No pneumothorax or significant pleural effusion. No acute finding in the visualized skeleton. IMPRESSION: New infiltrates throughout the bilateral lower lungs  concerning for infection, less likely edema. Recommend radiographic follow-up to resolution. Electronically Signed   By: Audie Pinto M.D.   On: 03/04/2020 14:12    Scheduled Meds: . amLODipine  10 mg Oral Daily  . azithromycin  250 mg Oral Daily  . benzonatate  100 mg Oral TID  . dexamethasone (DECADRON) injection  6 mg Intravenous Q24H  . feeding supplement (ENSURE ENLIVE)  237 mL Oral TID BM  . folic acid  1 mg Oral Daily  . multivitamin with minerals  1 tablet Oral Daily  . thiamine  100 mg Oral Daily   Continuous Infusions: . cefTRIAXone (ROCEPHIN)  IV 2 g (03/06/20 1012)    Assessment/Plan:  1. Abnormal CT scan of the chest with mediastinal and hilar lymphadenopathy and lytic lesions on the bone.  Case discussed with Dr. Rogue Bussing oncology to see in follow-up biopsy report.  Interventional radiology to do a biopsy of lymph nodes tomorrow.  This is concerning for cancerous type of process.  Serum protein electrophoresis also sent off.   2. Bilateral lobar pneumonia, COPD exacerbation.  Continue Rocephin Zithromax and Decadron.  2 Covid test negative. 3. Acute hypoxic respiratory failure with pulse ox of 79% on room air yesterday.  Will get qualifying saturations for home oxygen today. 4. Essential hypertension.  Continue Norvasc daily 5. Elevated troponin likely demand ischemia with acute hypoxic respiratory failure 6. Rheumatoid arthritis 7. Acute kidney injury on chronic kidney disease stage II.  GFR now above 60. 8. Severe protein calorie malnutrition  Code Status:     Code Status Orders  (From admission, onward)         Start     Ordered   03/04/20 1652  Full code  Continuous     03/04/20 1652        Code Status History    Date Active Date Inactive Code Status Order ID Comments User Context   03/27/2018 2153 03/29/2018 1630 Full Code [id number]  Idelle Crouch, MD Inpatient   05/09/2017 1455 05/11/2017 2000 Full Code [id number]  Idelle Crouch, MD Inpatient    01/06/2016 2330 01/07/2016 1918 Full Code [id number]  Lance Coon, MD Inpatient   Advance Care Planning Activity     Family Communication: Spoke with the patient's wife on the phone and given update. Disposition Plan: With the patient's abnormal CT scan of the chest, I set up biopsy with interventional radiology for Wednesday morning.  Patient may be able to go home with home health on Wednesday afternoon versus Thursday morning depending on how he is doing with regards to his breathing.  Antibiotics:  Rocephin  Zithromax  Time spent: 27 minutes  [place]

## 2020-03-06 NOTE — Consult Note (Signed)
[place] CONSULT NOTE  Patient Care Team: Revelo, Elyse Jarvis, MD as PCP - General (Family Medicine)  CHIEF COMPLAINTS/PURPOSE OF CONSULTATION: Mediastinal adenopathy  HISTORY OF PRESENTING ILLNESS:  Troy Shepherd 74 y.o.  male patient with longstanding history of smoking is currently admitted to hospital for worsening shortness of breath and cough.  On admission patient on the CT scan of the chest noted to have bulky bilateral mediastinal/hilar adenopathy and also nodular lesion noted in the right lower lobe of the lung.  Incidentally patient was also noted to have multiple lytic rib lesions concerning for metastasis.  No evidence of any acute PE.  Since being in the hospital patient noted to have some improvement of his breathing/and also chest wall pain.  Oncology has been consulted for further evaluation recommendations given significant concerns for malignancy noted on scan.  Patient complains of shortness of breath and cough.  However he is eager to go home.  Review of Systems  Constitutional: Negative for chills, diaphoresis, fever, malaise/fatigue and weight loss.  HENT: Negative for nosebleeds and sore throat.   Eyes: Negative for double vision.  Respiratory: Positive for cough, sputum production and shortness of breath. Negative for hemoptysis and wheezing.   Cardiovascular: Positive for chest pain. Negative for palpitations, orthopnea and leg swelling.  Gastrointestinal: Negative for abdominal pain, blood in stool, constipation, diarrhea, heartburn, melena, nausea and vomiting.  Genitourinary: Negative for dysuria, frequency and urgency.  Musculoskeletal: Positive for back pain and joint pain.  Skin: Negative.  Negative for itching and rash.  Neurological: Positive for headaches. Negative for dizziness, tingling, focal weakness and weakness.  Endo/Heme/Allergies: Does not bruise/bleed easily.  Psychiatric/Behavioral: Negative for depression. The patient is not  nervous/anxious and does not have insomnia.      MEDICAL HISTORY:  Past Medical History:  Diagnosis Date  . Alcohol abuse    quit 09/2014  . Arthritis   . Hypertension   . Leukopenia    resolved when stopped drinking 09/2014    SURGICAL HISTORY: Past Surgical History:  Procedure Laterality Date  . APPENDECTOMY    . COLONOSCOPY N/A 11/09/2015   Procedure: COLONOSCOPY;  Surgeon: Josefine Class, MD;  Location: [facility] ENDOSCOPY;  Service: Endoscopy;  Laterality: N/A;    SOCIAL HISTORY: Social History   Socioeconomic History  . Marital status: Married    Spouse name: Not on file  . Number of children: Not on file  . Years of education: Not on file  . Highest education level: Not on file  Occupational History  . Not on file  Tobacco Use  . Smoking status: Current Every Day Smoker    Packs/day: 0.10    Years: 40.00    Pack years: 4.00    Types: Cigarettes  . Smokeless tobacco: Never Used  Substance and Sexual Activity  . Alcohol use: Yes    Alcohol/week: 3.0 standard drinks    Types: 3 Shots of liquor per week    Comment: per pt drinks twice/d 5 days a wk  . Drug use: No  . Sexual activity: Not on file  Other Topics Concern  . Not on file  Social History Narrative   With wife; [place]. 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    Social Determinants of Health   Financial Resource Strain:   . Difficulty of Paying Living Expenses: Not on file  Food Insecurity:   . Worried About Charity fundraiser in the Last Year: Not on file  . Ran  Out of Food in the Last Year: Not on file  Transportation Needs:   . Lack of Transportation (Medical): Not on file  . Lack of Transportation (Non-Medical): Not on file  Physical Activity:   . Days of Exercise per Week: Not on file  . Minutes of Exercise per Session: Not on file  Stress:   . Feeling of Stress : Not on file  Social Connections:   . Frequency of Communication with Friends and Family: Not on  file  . Frequency of Social Gatherings with Friends and Family: Not on file  . Attends Religious Services: Not on file  . Active Member of Clubs or Organizations: Not on file  . Attends Archivist Meetings: Not on file  . Marital Status: Not on file  Intimate Partner Violence:   . Fear of Current or Ex-Partner: Not on file  . Emotionally Abused: Not on file  . Physically Abused: Not on file  . Sexually Abused: Not on file    FAMILY HISTORY: Family History  Problem Relation Age of Onset  . COPD Sister     ALLERGIES:  has No Known Allergies.  MEDICATIONS:  Current Facility-Administered Medications  Medication Dose Route Frequency Provider Last Rate Last Admin  . acetaminophen (TYLENOL) tablet 650 mg  650 mg Oral Q6H PRN Loletha Grayer, MD   650 mg at 03/05/20 1958   Or  . acetaminophen (TYLENOL) suppository 650 mg  650 mg Rectal Q6H PRN Loletha Grayer, MD      . amLODipine (NORVASC) tablet 10 mg  10 mg Oral Daily Loletha Grayer, MD   10 mg at 03/06/20 1006  . azithromycin (ZITHROMAX) tablet 250 mg  250 mg Oral Daily Loletha Grayer, MD   250 mg at 03/06/20 1006  . benzonatate (TESSALON) capsule 100 mg  100 mg Oral TID Loletha Grayer, MD   100 mg at 03/06/20 1644  . cefTRIAXone (ROCEPHIN) 2 g in sodium chloride 0.9 % 100 mL IVPB  2 g Intravenous Q24H Loletha Grayer, MD 200 mL/hr at 03/06/20 1012 2 g at 03/06/20 1012  . dexamethasone (DECADRON) injection 6 mg  6 mg Intravenous Q24H Loletha Grayer, MD   6 mg at 03/06/20 1007  . feeding supplement (ENSURE ENLIVE) (ENSURE ENLIVE) liquid 237 mL  237 mL Oral TID BM Loletha Grayer, MD   237 mL at 46/56/81 2751  . folic acid (FOLVITE) tablet 1 mg  1 mg Oral Daily Loletha Grayer, MD   1 mg at 03/06/20 1006  . guaiFENesin (ROBITUSSIN) 100 MG/5ML solution 100 mg  5 mL Oral Q4H PRN Wieting, Richard, MD      . ipratropium-albuterol (DUONEB) 0.5-2.5 (3) MG/3ML nebulizer solution 3 mL  3 mL Inhalation Q6H PRN Wieting,  Richard, MD      . morphine 2 MG/ML injection 2 mg  2 mg Intravenous Q4H PRN Lang Snow, NP   2 mg at 03/06/20 0805  . multivitamin with minerals tablet 1 tablet  1 tablet Oral Daily Loletha Grayer, MD   1 tablet at 03/06/20 1006  . ondansetron (ZOFRAN) tablet 4 mg  4 mg Oral Q6H PRN Loletha Grayer, MD       Or  . ondansetron [facility]) injection 4 mg  4 mg Intravenous Q6H PRN Loletha Grayer, MD   4 mg at 03/05/20 2119  . thiamine tablet 100 mg  100 mg Oral Daily Loletha Grayer, MD   100 mg at 03/06/20 1006      .  PHYSICAL EXAMINATION:  Vitals:   03/06/20 0535 03/06/20 1154  BP: (!) 146/81 138/83  Pulse: 79 86  Resp: 19   Temp: 98.1 F (36.7 C) 98.2 F (36.8 C)  SpO2: 100% 94%   Filed Weights   03/04/20 1210  Weight: 140 lb (63.5 kg)    Physical Exam  Constitutional: He is oriented to person, place, and time.  Mechele Claude African-American male patient.  He is resting the bed comfortably.  2 L of nasal cannula oxygen.  HENT:  Head: Normocephalic and atraumatic.  Mouth/Throat: Oropharynx is clear and moist. No oropharyngeal exudate.  Eyes: Pupils are equal, round, and reactive to light.  Cardiovascular: Normal rate and regular rhythm.  Pulmonary/Chest: No respiratory distress. He has no wheezes.  Decreased air entry bilaterally.  No wheeze or crackles.  Abdominal: Soft. Bowel sounds are normal. He exhibits no distension and no mass. There is no abdominal tenderness. There is no rebound and no guarding.  Musculoskeletal:        General: No tenderness or edema. Normal range of motion.     Cervical back: Normal range of motion and neck supple.  Neurological: He is alert and oriented to person, place, and time.  Skin: Skin is warm.  Psychiatric: Affect normal.     LABORATORY DATA:  I have reviewed the data as listed Lab Results  Component Value Date   WBC 13.2 (H) 03/05/2020   HGB 13.1 03/05/2020   HCT 38.8 (L) 03/05/2020   MCV 95.8 03/05/2020    PLT 315 03/05/2020   Recent Labs    03/09/19 0653 03/09/19 0653 03/04/20 1219 03/05/20 0546 03/06/20 0527  NA 136   < > 135 133* 137  K 3.9   < > 3.5 3.6 4.1  CL 106   < > 101 102 103  CO2 21*   < > 21* 21* 25  GLUCOSE 81   < > 96 114* 120*  BUN 20   < > 29* 36* 38*  CREATININE 1.18   < > 1.43* 1.28* 1.20  CALCIUM 9.0   < > 10.8* 9.4 10.4*  GFRNONAA >60   < > 48* 55* 59*  GFRAA >60   < > 56* >60 >60  PROT 8.0  --   --   --   --   ALBUMIN 3.6  --   --   --   --   AST 16  --   --   --   --   ALT 7  --   --   --   --   ALKPHOS 64  --   --   --   --   BILITOT 0.5  --   --   --   --    < > = values in this interval not displayed.    RADIOGRAPHIC STUDIES: I have personally reviewed the radiological images as listed and agreed with the findings in the report. CT ANGIO CHEST PE W OR WO CONTRAST  Result Date: 03/05/2020 CLINICAL DATA:  Shortness of breath bilateral pneumonia, elevated D-dimer EXAM: CT ANGIOGRAPHY CHEST WITH CONTRAST TECHNIQUE: Multidetector CT imaging of the chest was performed using the standard protocol during bolus administration of intravenous contrast. Multiplanar CT image reconstructions and MIPs were obtained to evaluate the vascular anatomy. CONTRAST:  2mL OMNIPAQUE IOHEXOL 350 MG/ML SOLN COMPARISON:  Chest x-ray of 03/04/2020 FINDINGS: Cardiovascular: Calcified and noncalcified plaque throughout the thoracic aorta. No signs of acute aortic process. Small pericardial effusion. Heart size is normal. No signs  of acute pulmonary embolism. Mediastinum/Nodes: Bulky mediastinal adenopathy. (Image 39, series 4): 2.9 cm AP window lymph node. (Image 38, series 4): 2.4 cm right paratracheal lymph node. Lymph nodes are seen throughout all nodal stations in the chest including the bilateral hila. Right hilar nodal tissue approximately 1.7 cm (image 50, series 4) anterior mediastinal/prevascular lymph nodes with enlargement as well. High AP window lymph node just below  pre-vascular lymph nodes (image 39, series 4) 1.8 cm. Esophagus is grossly normal. No signs of frank absolute ir E adenopathy. 17 mm left thoracic inlet lymph node and similarly sized right thoracic inlet lymph nodes. Ovoid lymph node without fatty hilum in the left axilla measuring 10 mm. Lungs/Pleura: Nodular opacities throughout the chest with a background of ground-glass attenuation and septal thickening, largest area measuring 2.3 x 2.1 cm in the right lower lobe this shows low-density. Consolidative changes and volume loss in the right middle lobe. Narrowing of central airways at the right hilum also in the left infrahilar region. Patchy opacities bilaterally throughout the chest with nodular features none as discrete as the area in the right lung base. Small right pleural effusion layers dependently. Upper Abdomen: No signs of acute finding in the upper abdomen. Musculoskeletal: Numerous areas of lytic change throughout the visualized skeleton, particularly the spine. These are too numerous to count with a focal area as an example on image 23 of series 4 measuring 11 mm. No signs of acute fracture. Review of the MIP images confirms the above findings. IMPRESSION: 1. No evidence of acute pulmonary embolism. 2. Bulky mediastinal and bilateral hilar adenopathy with nodular opacities throughout the chest and numerous areas of lytic change throughout the visualized skeleton. Findings concerning for primary pulmonary neoplasm or lymphoproliferative disorder with diffuse involvement/metastatic disease and with superimposed infection in the chest. 3. There was some mild nodal enlargement on previous exams. This is much worse and findings of diffuse bony lytic changes are also a new finding. 4. Some airway narrowing at the hila bilaterally worse on the right with some mild bronchial wall thickening. Superimposed infection may be due to postobstructive changes. 5. Small right pleural effusion. 6. Small pericardial  effusion. 7. Aortic atherosclerosis. Aortic Atherosclerosis (ICD10-I70.0). Electronically Signed   By: Zetta Bills M.D.   On: 03/05/2020 13:27   DG Chest Port 1 View  Result Date: 03/04/2020 CLINICAL DATA:  Pt presents to ED via POV with c/o cough, and SOB. Pt states when he coughs he has substernal CP. Upon arrival to ED pt's RA sats 85-87% on RA. Pt also c/o dyspnea on exertion. Pt placed on 2L via [place] at this time. EXAM: PORTABLE CHEST 1 VIEW COMPARISON:  Chest radiograph 03/28/2018 FINDINGS: Stable cardiomediastinal contours. The heart size. Lungs are hyperinflated. There are new infiltrates throughout the bilateral lower lungs concerning for infection. Background chronic bronchitic change. No pneumothorax or significant pleural effusion. No acute finding in the visualized skeleton. IMPRESSION: New infiltrates throughout the bilateral lower lungs concerning for infection, less likely edema. Recommend radiographic follow-up to resolution. Electronically Signed   By: Audie Pinto M.D.   On: 03/04/2020 14:12    Mediastinal lymphadenopathy #74 year old male patient currently in the hospital for worsening shortness of breath; CT scan chest shows bulky mediastinal/hilar adenopathy/rib lytic lesions;   #Bulky mediastinal adenopathy; hilar adenopathy supraclavicular adenopathy with lytic lesions of the ribs-highly suspicious for malignant process.   #Worsening shortness of breath-combination of COPD/pneumonia-on antibiotics.  #Chest wall pain-likely secondary to bone metastases.   Recommendations:  #I  had a long discussion with patient regarding my serious concerns of malignancy based on imaging/history of smoking.  I would recommend biopsy for tissue confirmation.  As per staff, biopsy planned for tomorrow.  The treatment for underlying malignancy was diagnosed will be discussed once the pathology is available.  Patient also need MRI of the brain  #Lytic bone lesions-recommend Zometa for pain  control.  Thank you Dr.Wieting for allowing me to participate in the care of your pleasant patient. Please do not hesitate to contact me with questions or concerns in the interim.  # I reviewed the blood work- with the patient in detail; also reviewed the imaging independently [as summarized above]; and with the patient in detail.      All questions were answered. The patient knows to call the clinic with any problems, questions or concerns.   Cammie Sickle, MD 03/06/2020 5:18 PM

## 2020-03-07 ENCOUNTER — Telehealth: Payer: Self-pay | Admitting: Internal Medicine

## 2020-03-07 ENCOUNTER — Inpatient Hospital Stay: Payer: Medicare HMO

## 2020-03-07 DIAGNOSIS — E43 Unspecified severe protein-calorie malnutrition: Secondary | ICD-10-CM

## 2020-03-07 DIAGNOSIS — J441 Chronic obstructive pulmonary disease with (acute) exacerbation: Secondary | ICD-10-CM

## 2020-03-07 DIAGNOSIS — R590 Localized enlarged lymph nodes: Secondary | ICD-10-CM

## 2020-03-07 DIAGNOSIS — R9389 Abnormal findings on diagnostic imaging of other specified body structures: Secondary | ICD-10-CM

## 2020-03-07 LAB — CBC WITH DIFFERENTIAL/PLATELET
Abs Immature Granulocytes: 0.24 10*3/uL — ABNORMAL HIGH (ref 0.00–0.07)
Basophils Absolute: 0 10*3/uL (ref 0.0–0.1)
Basophils Relative: 0 %
Eosinophils Absolute: 0 10*3/uL (ref 0.0–0.5)
Eosinophils Relative: 0 %
HCT: 36.2 % — ABNORMAL LOW (ref 39.0–52.0)
Hemoglobin: 11.9 g/dL — ABNORMAL LOW (ref 13.0–17.0)
Immature Granulocytes: 1 %
Lymphocytes Relative: 7 %
Lymphs Abs: 1.4 10*3/uL (ref 0.7–4.0)
MCH: 32.1 pg (ref 26.0–34.0)
MCHC: 32.9 g/dL (ref 30.0–36.0)
MCV: 97.6 fL (ref 80.0–100.0)
Monocytes Absolute: 2 10*3/uL — ABNORMAL HIGH (ref 0.1–1.0)
Monocytes Relative: 10 %
Neutro Abs: 15.7 10*3/uL — ABNORMAL HIGH (ref 1.7–7.7)
Neutrophils Relative %: 82 %
Platelets: 290 10*3/uL (ref 150–400)
RBC: 3.71 MIL/uL — ABNORMAL LOW (ref 4.22–5.81)
RDW: 13.1 % (ref 11.5–15.5)
WBC: 19.4 10*3/uL — ABNORMAL HIGH (ref 4.0–10.5)
nRBC: 0 % (ref 0.0–0.2)

## 2020-03-07 LAB — PROTEIN ELECTROPHORESIS, SERUM
A/G Ratio: 0.6 — ABNORMAL LOW (ref 0.7–1.7)
Albumin ELP: 2.6 g/dL — ABNORMAL LOW (ref 2.9–4.4)
Alpha-1-Globulin: 0.6 g/dL — ABNORMAL HIGH (ref 0.0–0.4)
Alpha-2-Globulin: 0.9 g/dL (ref 0.4–1.0)
Beta Globulin: 1.4 g/dL — ABNORMAL HIGH (ref 0.7–1.3)
Gamma Globulin: 1.2 g/dL (ref 0.4–1.8)
Globulin, Total: 4.1 g/dL — ABNORMAL HIGH (ref 2.2–3.9)
Total Protein ELP: 6.7 g/dL (ref 6.0–8.5)

## 2020-03-07 MED ORDER — FENTANYL CITRATE (PF) 100 MCG/2ML IJ SOLN
INTRAMUSCULAR | Status: AC
  Filled 2020-03-07: qty 2

## 2020-03-07 MED ORDER — BENZONATATE 100 MG PO CAPS: 100.0000 mg | ORAL_CAPSULE | Freq: Three times a day (TID) | ORAL | 0 refills | Status: AC

## 2020-03-07 MED ORDER — AZITHROMYCIN 250 MG PO TABS: ORAL_TABLET | ORAL | 0 refills | Status: DC

## 2020-03-07 MED ORDER — SODIUM CHLORIDE 0.9 % IV SOLN
INTRAVENOUS | Status: DC | PRN
  Administered 2020-03-07: 10:00:00 50 mL via INTRAVENOUS

## 2020-03-07 MED ORDER — MIDAZOLAM HCL 2 MG/2ML IJ SOLN
INTRAMUSCULAR | Status: AC
  Filled 2020-03-07: qty 2

## 2020-03-07 MED ORDER — LEVOFLOXACIN 750 MG PO TABS: 750.0000 mg | ORAL_TABLET | Freq: Every day | ORAL | 0 refills | Status: DC

## 2020-03-07 MED ORDER — PREDNISONE 20 MG PO TABS: 20.0000 mg | ORAL_TABLET | Freq: Every day | ORAL | 0 refills | Status: DC

## 2020-03-07 NOTE — Care Management Important Message (Signed)
Important Message  Patient Details  Name: Troy Shepherd MRN: [id number] Date of Birth: 10-17-1946   Medicare Important Message Given:  Yes     Dannette Barbara 03/07/2020, 12:07 PM

## 2020-03-07 NOTE — TOC Transition Note (Signed)
Transition of Care [facility]) - CM/SW Discharge Note   Patient Details  Name: Troy Shepherd MRN: [id number] Date of Birth: 1946/10/09  [facility] CM/SW Contact:  Shelbie Ammons, RN Phone Number: 03/07/2020, 1:43 PM   Clinical Narrative:  Patient to discharge with home health services through [facility]. Oxygen to be provided by [employer] and Leroy Sea is aware. Patient will discharge this afternoon after oxygen is delivered, no other needs identified at this time.     Final next level of care: [place] Barriers to Discharge: Barriers Resolved   Patient Goals and CMS Choice        Discharge Placement                       Discharge Plan and Services   Discharge Planning Services: CM Consult Post Acute Care Choice: Home Health          DME Arranged: Oxygen DME Agency: [facility] Date DME Agency Contacted: 03/06/20 Time DME Agency Contacted: 1430 Representative spoke with at DME Agency: [facility]            Social Determinants of Health ([place]) Interventions     Readmission Risk Interventions No flowsheet data found.

## 2020-03-07 NOTE — Progress Notes (Signed)
Troy Shepherd   DOB:1946/11/22   RK#:[id number]    Subjective: No acute events overnight.  Patient states his breathing is improved.  He is eager to go home.  He has been walking the bathroom.  Objective:  Vitals:   03/07/20 0941 03/07/20 1151  BP: 108/60 120/66  Pulse: 82 85  Resp:    Temp:  98.9 F (37.2 C)  SpO2:  93%    No intake or output data in the 24 hours ending 03/07/20 1713  Physical Exam  Constitutional: He is oriented to person, place, and time.  Thin cachectic appearing male patient.  HENT:  Head: Normocephalic and atraumatic.  Mouth/Throat: Oropharynx is clear and moist. No oropharyngeal exudate.  Eyes: Pupils are equal, round, and reactive to light.  Cardiovascular: Normal rate and regular rhythm.  Pulmonary/Chest: No respiratory distress. He has no wheezes.  Decreased air entry bilaterally.  Abdominal: Soft. Bowel sounds are normal. He exhibits no distension and no mass. There is no abdominal tenderness. There is no rebound and no guarding.  Musculoskeletal:        General: No tenderness or edema. Normal range of motion.     Cervical back: Normal range of motion and neck supple.  Neurological: He is alert and oriented to person, place, and time.  Skin: Skin is warm.  Psychiatric: Affect normal.     Labs:  Lab Results  Component Value Date   WBC 19.4 (H) 03/07/2020   HGB 11.9 (L) 03/07/2020   HCT 36.2 (L) 03/07/2020   MCV 97.6 03/07/2020   PLT 290 03/07/2020   NEUTROABS 15.7 (H) 03/07/2020    Lab Results  Component Value Date   NA 137 03/06/2020   K 4.1 03/06/2020   CL 103 03/06/2020   CO2 25 03/06/2020    Studies:  [place] CORE BIOPSY (SOFT TISSUE)  Result Date: 03/07/2020 INDICATION: 74 year old male with a history of pathologic supraclavicular adenopathy, likely metastatic lung cancer EXAM: ULTRASOUND-GUIDED BIOPSY RIGHT SUPRACLAVICULAR LYMPH NODE MEDICATIONS: None. ANESTHESIA/SEDATION: None. FLUOROSCOPY TIME:  None COMPLICATIONS: None PROCEDURE:  Informed written consent was obtained from the patient after a thorough discussion of the procedural risks, benefits and alternatives. All questions were addressed. Maximal Sterile Barrier Technique was utilized including caps, mask, sterile gowns, sterile gloves, sterile drape, hand hygiene and skin antiseptic. A timeout was performed prior to the initiation of the procedure. Ultrasound survey was performed with images stored and sent to PACs. The neck was prepped with chlorhexidine in a sterile fashion, and a sterile drape was applied covering the operative field. A sterile gown and sterile gloves were used for the procedure. Local anesthesia was provided with 1% Lidocaine. Ultrasound guidance was used to infiltrate the region with 1% lidocaine for local anesthesia. Multiple separate 18 gauge core needle biopsy were then acquired of the right supraclavicular lymph node using ultrasound guidance. Images were stored. Final image was stored after biopsy. Patient tolerated the procedure well and remained hemodynamically stable throughout. No complications were encountered and no significant blood loss was encounter IMPRESSION: Status post ultrasound-guided right supraclavicular lymph node biopsy. Signed, Dulcy Fanny. Dellia Nims, RPVI [facility] Radiology Electronically Signed   By: Corrie Mckusick D.O.   On: 03/07/2020 08:42    Mediastinal lymphadenopathy #74 year old male patient currently in the hospital for worsening shortness of breath; CT scan chest shows bulky mediastinal/hilar adenopathy/rib lytic lesions;   # Bulky mediastinal adenopathy; hilar adenopathy supraclavicular adenopathy with lytic lesions of the ribs-highly suspicious for malignant process-awaiting  biopsy this morning.  #COPD/pneumonia-on antibiotics/steroids-slightly improved.  #Chest wall pain-likely secondary to bone metastases-stable; continue pain management  #Disposition-patient  potentially can be discharged post biopsy; will follow up in the cancer center clinic early next week.   Addendum: Discussed with Dr. Reuel Derby; touch preps suggestive of malignancy.  Further work-up in progress.         Cammie Sickle, MD 03/07/2020  5:13 PM

## 2020-03-07 NOTE — Procedures (Signed)
Interventional Radiology Procedure Note  Procedure: US guided right supra-clavicular node biopsy.  Complications: None Recommendations:  - Ok to shower tomorrow - Do not submerge for 7 days - Routine care   Signed,  Dulcy Fanny. Earleen Newport, DO

## 2020-03-07 NOTE — Discharge Summary (Signed)
Physician Discharge Summary  Troy Shepherd ULA:[id number] DOB: 1946/05/01 DOA: 03/04/2020  PCP: Troy Burrow, MD  Admit date: 03/04/2020 Discharge date: 03/07/2020  Admitted From: Home Disposition: Home  Recommendations for Outpatient Follow-up:  1. Follow up with PCP in 1-2 weeks.  Appointment scheduled on 3/17 2. Oncology will follow up with you for biopsy results and continued care recommendation  Home Health: Yes Equipment/Devices: Oxygen 2 L Discharge Condition: Stable CODE STATUS: Full Diet recommendation: Regular  Brief/Interim Summary: Troy Shepherd  is a 74 y.o. male coming in with chest pain and cough going on since Wednesday.  For the past 4 days he has not been feeling well.  He has had cough and chest discomfort with coughing.  His cough is nonproductive.  No wheeze.  No fever or chills but does feel weak.  In the ER he was found to be hypoxic with a pulse ox of 87% on room air and patient was placed on oxygen.  Patient was also found to have bilateral lower lung infiltrates on chest x-ray concerning for pneumonia.  Hospitalist services were contacted for evaluation.  COVID-19 test ordered.  Breathing gradually improved on course of admission.  Patient started on empiric antibiotics and steroids for presumed COPD exacerbation with associated bilateral lobar pneumonia.  Considering the new findings a IR consultation was sought for image guided nodal biopsy.  Oncology consulted.  Patient underwent successful node biopsy on 03/07/2020.  Patient stable for discharge home afterwards as breathing has improved.  We will set up home health as well as home oxygen.    Discharge Diagnoses:  Active Problems:   Bilateral pneumonia   Protein-calorie malnutrition, severe   Elevated d-dimer   Mediastinal lymphadenopathy   Abnormal CT of the chest   COPD with acute exacerbation (HCC)  1. Abnormal CT scan of the chest with mediastinal and hilar lymphadenopathy and lytic lesions on the  bone.  Case discussed with Dr. Rogue Shepherd oncology to see in follow-up biopsy report.  IR completed lymph node biopsy on 03/07/2020.  Concerning for malignant process.  Patient stable for discharge home.  Will follow up with PCP and oncology post discharge. 2. Bilateral lobar pneumonia, COPD exacerbation.    Acute hypoxic respiratory failure empiric Rocephin and Zithromax were started in the hospital.  Will complete 7-day course of antibiotics total.  Additional 1 day of Zithromax post discharge.  Transition Rocephin to Levaquin to complete 7-day course.  We will also prescribe 5 days of prednisone 20 mg daily.   3. Acute hypoxic respiratory failure. with pulse ox of 79% on room air yesterday.    Patient ambulated.  Qualifying saturations achieved.  Home oxygen ordered.  4.  Essential hypertension.  Continue Norvasc daily 5. Elevated troponin likely demand ischemia with acute hypoxic respiratory failure 6. Rheumatoid arthritis 7. Acute kidney injury on chronic kidney disease stage II.  GFR now above 60. 8. Severe protein calorie malnutrition  Discharge Instructions  Discharge Instructions    Diet - low sodium heart healthy   Complete by: As directed    Discharge instructions   Complete by: As directed    OK to take a shower on 3/11.  Do not submerge the wound from biopsy.  Keep the site clean and dry.  You have been prescribed a short course of antibiotics and steroids for pneumonia and COPD.  Please complete course.  I strongly recommend establishing care with a primary care physician.   Increase activity slowly   Complete by: As directed  Allergies as of 03/07/2020   No Known Allergies     Medication List    TAKE these medications   amLODipine 5 MG tablet Commonly known as: NORVASC Take 1 tablet (5 mg total) by mouth 2 (two) times daily.   aspirin 81 MG EC tablet Take 1 tablet (81 mg total) by mouth daily.   azithromycin 250 MG tablet Commonly known as: ZITHROMAX Take 1 tab  by mouth on 3/11 to complete course Start taking on: March 08, 2020   benzonatate 100 MG capsule Commonly known as: TESSALON Take 1 capsule (100 mg total) by mouth 3 (three) times daily for 7 days.   folic acid 1 MG tablet Commonly known as: FOLVITE Take 1 tablet (1 mg total) by mouth daily.   levofloxacin 750 MG tablet Commonly known as: Levaquin Take 1 tablet (750 mg total) by mouth daily for 4 days. Start taking on: March 08, 2020   metoprolol tartrate 25 MG tablet Commonly known as: LOPRESSOR Take 1 tablet (25 mg total) by mouth 2 (two) times daily.   multivitamin with minerals Tabs tablet Take 1 tablet by mouth daily.   naproxen 500 MG tablet Commonly known as: Naprosyn Take 1 tablet (500 mg total) by mouth 2 (two) times daily with a meal.   predniSONE 20 MG tablet Commonly known as: Deltasone Take 1 tablet (20 mg total) by mouth daily for 4 days.   thiamine 100 MG tablet Take 1 tablet (100 mg total) by mouth daily.            Durable Medical Equipment  (From admission, onward)         Start     Ordered   03/07/20 0721  For home use only DME oxygen  Once    Question Answer Comment  Length of Need 6 Months   Mode or (Route) Nasal cannula   Liters per Minute 2   Frequency Continuous (stationary and portable oxygen unit needed)   Oxygen conserving device Yes   Oxygen delivery system Gas      03/07/20 0720         Follow-up Information    Revelo, Elyse Jarvis, MD On 03/14/2020.   Specialty: Family Medicine Why: 3pm. Video appointment Contact information: [address] [place] [address] [phone]        Follow up On 03/14/2020.          No Known Allergies  Consultations:  Oncology- Dr Troy Shepherd   Procedures/Studies: CT ANGIO CHEST PE W OR WO CONTRAST  Result Date: 03/05/2020 CLINICAL DATA:  Shortness of breath bilateral pneumonia, elevated D-dimer EXAM: CT ANGIOGRAPHY CHEST WITH CONTRAST TECHNIQUE: Multidetector CT  imaging of the chest was performed using the standard protocol during bolus administration of intravenous contrast. Multiplanar CT image reconstructions and MIPs were obtained to evaluate the vascular anatomy. CONTRAST:  32mL OMNIPAQUE IOHEXOL 350 MG/ML SOLN COMPARISON:  Chest x-ray of 03/04/2020 FINDINGS: Cardiovascular: Calcified and noncalcified plaque throughout the thoracic aorta. No signs of acute aortic process. Small pericardial effusion. Heart size is normal. No signs of acute pulmonary embolism. Mediastinum/Nodes: Bulky mediastinal adenopathy. (Image 39, series 4): 2.9 cm AP window lymph node. (Image 38, series 4): 2.4 cm right paratracheal lymph node. Lymph nodes are seen throughout all nodal stations in the chest including the bilateral hila. Right hilar nodal tissue approximately 1.7 cm (image 50, series 4) anterior mediastinal/prevascular lymph nodes with enlargement as well. High AP window lymph node just below pre-vascular lymph nodes (image  39, series 4) 1.8 cm. Esophagus is grossly normal. No signs of frank absolute ir E adenopathy. 17 mm left thoracic inlet lymph node and similarly sized right thoracic inlet lymph nodes. Ovoid lymph node without fatty hilum in the left axilla measuring 10 mm. Lungs/Pleura: Nodular opacities throughout the chest with a background of ground-glass attenuation and septal thickening, largest area measuring 2.3 x 2.1 cm in the right lower lobe this shows low-density. Consolidative changes and volume loss in the right middle lobe. Narrowing of central airways at the right hilum also in the left infrahilar region. Patchy opacities bilaterally throughout the chest with nodular features none as discrete as the area in the right lung base. Small right pleural effusion layers dependently. Upper Abdomen: No signs of acute finding in the upper abdomen. Musculoskeletal: Numerous areas of lytic change throughout the visualized skeleton, particularly the spine. These are too  numerous to count with a focal area as an example on image 23 of series 4 measuring 11 mm. No signs of acute fracture. Review of the MIP images confirms the above findings. IMPRESSION: 1. No evidence of acute pulmonary embolism. 2. Bulky mediastinal and bilateral hilar adenopathy with nodular opacities throughout the chest and numerous areas of lytic change throughout the visualized skeleton. Findings concerning for primary pulmonary neoplasm or lymphoproliferative disorder with diffuse involvement/metastatic disease and with superimposed infection in the chest. 3. There was some mild nodal enlargement on previous exams. This is much worse and findings of diffuse bony lytic changes are also a new finding. 4. Some airway narrowing at the hila bilaterally worse on the right with some mild bronchial wall thickening. Superimposed infection may be due to postobstructive changes. 5. Small right pleural effusion. 6. Small pericardial effusion. 7. Aortic atherosclerosis. Aortic Atherosclerosis (ICD10-I70.0). Electronically Signed   By: Zetta Bills M.D.   On: 03/05/2020 13:27   DG Chest Port 1 View  Result Date: 03/04/2020 CLINICAL DATA:  Pt presents to ED via POV with c/o cough, and SOB. Pt states when he coughs he has substernal CP. Upon arrival to ED pt's RA sats 85-87% on RA. Pt also c/o dyspnea on exertion. Pt placed on 2L via  at this time. EXAM: PORTABLE CHEST 1 VIEW COMPARISON:  Chest radiograph 03/28/2018 FINDINGS: Stable cardiomediastinal contours. The heart size. Lungs are hyperinflated. There are new infiltrates throughout the bilateral lower lungs concerning for infection. Background chronic bronchitic change. No pneumothorax or significant pleural effusion. No acute finding in the visualized skeleton. IMPRESSION: New infiltrates throughout the bilateral lower lungs concerning for infection, less likely edema. Recommend radiographic follow-up to resolution. Electronically Signed   By: Audie Pinto  M.D.   On: 03/04/2020 14:12   [place] CORE BIOPSY (SOFT TISSUE)  Result Date: 03/07/2020 INDICATION: 74 year old male with a history of pathologic supraclavicular adenopathy, likely metastatic lung cancer EXAM: ULTRASOUND-GUIDED BIOPSY RIGHT SUPRACLAVICULAR LYMPH NODE MEDICATIONS: None. ANESTHESIA/SEDATION: None. FLUOROSCOPY TIME:  None COMPLICATIONS: None PROCEDURE: Informed written consent was obtained from the patient after a thorough discussion of the procedural risks, benefits and alternatives. All questions were addressed. Maximal Sterile Barrier Technique was utilized including caps, mask, sterile gowns, sterile gloves, sterile drape, hand hygiene and skin antiseptic. A timeout was performed prior to the initiation of the procedure. Ultrasound survey was performed with images stored and sent to PACs. The neck was prepped with chlorhexidine in a sterile fashion, and a sterile drape was applied covering the operative field. A sterile gown and sterile gloves were used for the procedure. Local  anesthesia was provided with 1% Lidocaine. Ultrasound guidance was used to infiltrate the region with 1% lidocaine for local anesthesia. Multiple separate 18 gauge core needle biopsy were then acquired of the right supraclavicular lymph node using ultrasound guidance. Images were stored. Final image was stored after biopsy. Patient tolerated the procedure well and remained hemodynamically stable throughout. No complications were encountered and no significant blood loss was encounter IMPRESSION: Status post ultrasound-guided right supraclavicular lymph node biopsy. Signed, Dulcy Fanny. Dellia Nims, RPVI [facility]iology Electronically Signed   By: Corrie Mckusick D.O.   On: 03/07/2020 08:42    (Echo, Carotid, EGD, Colonoscopy, ERCP)    Subjective: Patient seen and examined the day of discharge No complaints, wishes to go home  Discharge Exam: Vitals:   03/07/20 0941  03/07/20 1151  BP: 108/60 120/66  Pulse: 82 85  Resp:    Temp:  98.9 F (37.2 C)  SpO2:  93%   Vitals:   03/07/20 0732 03/07/20 0803 03/07/20 0941 03/07/20 1151  BP: 123/63 119/82 108/60 120/66  Pulse: 77 75 82 85  Resp: (!) 22 (!) 22    Temp:    98.9 F (37.2 C)  TempSrc:    Oral  SpO2: 96% 97%  93%  Weight:      Height:        General: Pt is alert, awake, not in acute distress Cardiovascular: RRR, S1/S2 +, no rubs, no gallops Respiratory: CTA bilaterally, no wheezing, no rhonchi Abdominal: Soft, NT, ND, bowel sounds + Extremities: no edema, no cyanosis    The results of significant diagnostics from this hospitalization (including imaging, microbiology, ancillary and laboratory) are listed below for reference.     Microbiology: Recent Results (from the past 240 hour(s))  Blood Culture (routine x 2)     Status: None (Preliminary result)   Collection Time: 03/04/20 12:18 PM   Specimen: BLOOD  Result Value Ref Range Status   Specimen Description BLOOD RARM [facility]  Final   Special Requests   Final    BOTTLES DRAWN AEROBIC AND ANAEROBIC Blood Culture adequate volume   Culture   Final    NO GROWTH 3 DAYS Performed at [facility], [address], [place], [place] [address]    Report Status PENDING  Incomplete  Blood Culture (routine x 2)     Status: None (Preliminary result)   Collection Time: 03/04/20  3:16 PM   Specimen: BLOOD  Result Value Ref Range Status   Specimen Description BLOOD L HAND  Final   Special Requests   Final    BOTTLES DRAWN AEROBIC AND ANAEROBIC Blood Culture adequate volume   Culture   Final    NO GROWTH 3 DAYS Performed at [facility], [address], [place], [place] [address]    Report Status PENDING  Incomplete  Respiratory Panel by RT PCR (Flu A&B, Covid) - Nasopharyngeal Swab     Status: None   Collection Time: 03/04/20  3:17 PM   Specimen: Nasopharyngeal Swab  Result Value Ref Range Status   SARS Coronavirus 2 by  RT PCR NEGATIVE NEGATIVE Final    Comment: (NOTE) SARS-CoV-2 target nucleic acids are NOT DETECTED. The SARS-CoV-2 RNA is generally detectable in upper respiratoy specimens during the acute phase of infection. The lowest concentration of SARS-CoV-2 viral copies this assay can detect is 131 copies/mL. A negative result does not preclude SARS-Cov-2 infection and should not be used as the sole basis for treatment or  other patient management decisions. A negative result may occur with  improper specimen collection/handling, submission of specimen other than nasopharyngeal swab, presence of viral mutation(s) within the areas targeted by this assay, and inadequate number of viral copies (<131 copies/mL). A negative result must be combined with clinical observations, patient history, and epidemiological information. The expected result is Negative. Fact Sheet for Patients:  PinkCheek.be Fact Sheet for Healthcare Providers:  GravelBags.it This test is not yet ap proved or cleared by the [place] FDA and  has been authorized for detection and/or diagnosis of SARS-CoV-2 by FDA under an Emergency Use Authorization (EUA). This EUA will remain  in effect (meaning this test can be used) for the duration of the COVID-19 declaration under Section 564(b)(1) of the Act, 21 U.S.C. section 360bbb-3(b)(1), unless the authorization is terminated or revoked sooner.    Influenza A by PCR NEGATIVE NEGATIVE Final   Influenza B by PCR NEGATIVE NEGATIVE Final    Comment: (NOTE) The Xpert Xpress SARS-CoV-2/FLU/RSV assay is intended as an aid in  the diagnosis of influenza from Nasopharyngeal swab specimens and  should not be used as a sole basis for treatment. Nasal washings and  aspirates are unacceptable for Xpert Xpress SARS-CoV-2/FLU/RSV  testing. Fact Sheet for Patients: PinkCheek.be Fact Sheet for Healthcare  Providers: GravelBags.it This test is not yet approved or cleared by the [place] FDA and  has been authorized for detection and/or diagnosis of SARS-CoV-2 by  FDA under an Emergency Use Authorization (EUA). This EUA will remain  in effect (meaning this test can be used) for the duration of the  Covid-19 declaration under Section 564(b)(1) of the Act, 21  U.S.C. section 360bbb-3(b)(1), unless the authorization is  terminated or revoked. Performed at [facility], [place], [place] [address]   SARS CORONAVIRUS 2 (TAT 6-24 HRS) Nasopharyngeal Nasopharyngeal Swab     Status: None   Collection Time: 03/05/20  2:35 PM   Specimen: Nasopharyngeal Swab  Result Value Ref Range Status   SARS Coronavirus 2 NEGATIVE NEGATIVE Final    Comment: (NOTE) SARS-CoV-2 target nucleic acids are NOT DETECTED. The SARS-CoV-2 RNA is generally detectable in upper and lower respiratory specimens during the acute phase of infection. Negative results do not preclude SARS-CoV-2 infection, do not rule out co-infections with other pathogens, and should not be used as the sole basis for treatment or other patient management decisions. Negative results must be combined with clinical observations, patient history, and epidemiological information. The expected result is Negative. Fact Sheet for Patients: SugarRoll.be Fact Sheet for Healthcare Providers: https://www.woods-mathews.com/ This test is not yet approved or cleared by the [place] FDA and  has been authorized for detection and/or diagnosis of SARS-CoV-2 by FDA under an Emergency Use Authorization (EUA). This EUA will remain  in effect (meaning this test can be used) for the duration of the COVID-19 declaration under Section 56 4(b)(1) of the Act, 21 U.S.C. section 360bbb-3(b)(1), unless the authorization is terminated or revoked sooner. Performed at  [facility], [place] [address], [place], [place] [address]      Labs: BNP (last 3 results) No results for input(s): BNP in the last 8760 hours. Basic Metabolic Panel: Recent Labs  Lab 03/04/20 1219 03/05/20 0546 03/06/20 0527  NA 135 133* 137  K 3.5 3.6 4.1  CL 101 102 103  CO2 21* 21* 25  GLUCOSE 96 114* 120*  BUN 29* 36* 38*  CREATININE 1.43* 1.28* 1.20  CALCIUM 10.8* 9.4  10.4*   Liver Function Tests: No results for input(s): AST, ALT, ALKPHOS, BILITOT, PROT, ALBUMIN in the last 168 hours. No results for input(s): LIPASE, AMYLASE in the last 168 hours. No results for input(s): AMMONIA in the last 168 hours. CBC: Recent Labs  Lab 03/04/20 1219 03/05/20 0546 03/07/20 0846  WBC 11.3* 13.2* 19.4*  NEUTROABS  --   --  15.7*  HGB 14.5 13.1 11.9*  HCT 42.0 38.8* 36.2*  MCV 95.9 95.8 97.6  PLT 329 315 290   Cardiac Enzymes: No results for input(s): CKTOTAL, CKMB, CKMBINDEX, TROPONINI in the last 168 hours. BNP: Invalid input(s): POCBNP CBG: No results for input(s): GLUCAP in the last 168 hours. D-Dimer No results for input(s): DDIMER in the last 72 hours. Hgb A1c No results for input(s): HGBA1C in the last 72 hours. Lipid Profile Recent Labs    03/04/20 1517  TRIG 114   Thyroid function studies No results for input(s): TSH, T4TOTAL, T3FREE, THYROIDAB in the last 72 hours.  Invalid input(s): FREET3 Anemia work up Recent Labs    03/04/20 1517  FERRITIN 720*   Urinalysis    Component Value Date/Time   COLORURINE AMBER (A) 03/28/2018 1650   APPEARANCEUR CLEAR (A) 03/28/2018 1650   LABSPEC 1.024 03/28/2018 1650   PHURINE 6.0 03/28/2018 1650   GLUCOSEU NEGATIVE 03/28/2018 1650   HGBUR NEGATIVE 03/28/2018 1650   BILIRUBINUR NEGATIVE 03/28/2018 1650   KETONESUR NEGATIVE 03/28/2018 1650   PROTEINUR NEGATIVE 03/28/2018 1650   NITRITE NEGATIVE 03/28/2018 1650   LEUKOCYTESUR NEGATIVE 03/28/2018 1650   Sepsis Labs Invalid input(s): PROCALCITONIN,   WBC,  LACTICIDVEN Microbiology Recent Results (from the past 240 hour(s))  Blood Culture (routine x 2)     Status: None (Preliminary result)   Collection Time: 03/04/20 12:18 PM   Specimen: BLOOD  Result Value Ref Range Status   Specimen Description BLOOD RARM [facility]  Final   Special Requests   Final    BOTTLES DRAWN AEROBIC AND ANAEROBIC Blood Culture adequate volume   Culture   Final    NO GROWTH 3 DAYS Performed at [facility], [address] [place], [place] [address]    Report Status PENDING  Incomplete  Blood Culture (routine x 2)     Status: None (Preliminary result)   Collection Time: 03/04/20  3:16 PM   Specimen: BLOOD  Result Value Ref Range Status   Specimen Description BLOOD L HAND  Final   Special Requests   Final    BOTTLES DRAWN AEROBIC AND ANAEROBIC Blood Culture adequate volume   Culture   Final    NO GROWTH 3 DAYS Performed at [facility], [address], [place], [place] [address]    Report Status PENDING  Incomplete  Respiratory Panel by RT PCR (Flu A&B, Covid) - Nasopharyngeal Swab     Status: None   Collection Time: 03/04/20  3:17 PM   Specimen: Nasopharyngeal Swab  Result Value Ref Range Status   SARS Coronavirus 2 by RT PCR NEGATIVE NEGATIVE Final    Comment: (NOTE) SARS-CoV-2 target nucleic acids are NOT DETECTED. The SARS-CoV-2 RNA is generally detectable in upper respiratoy specimens during the acute phase of infection. The lowest concentration of SARS-CoV-2 viral copies this assay can detect is 131 copies/mL. A negative result does not preclude SARS-Cov-2 infection and should not be used as the sole basis for treatment or other patient management decisions. A negative result may occur with  improper specimen collection/handling, submission of specimen other than  nasopharyngeal swab, presence of viral mutation(s) within the areas targeted by this assay, and inadequate number of viral copies (<131 copies/mL). A negative result  must be combined with clinical observations, patient history, and epidemiological information. The expected result is Negative. Fact Sheet for Patients:  PinkCheek.be Fact Sheet for Healthcare Providers:  GravelBags.it This test is not yet ap proved or cleared by the [place] FDA and  has been authorized for detection and/or diagnosis of SARS-CoV-2 by FDA under an Emergency Use Authorization (EUA). This EUA will remain  in effect (meaning this test can be used) for the duration of the COVID-19 declaration under Section 564(b)(1) of the Act, 21 U.S.C. section 360bbb-3(b)(1), unless the authorization is terminated or revoked sooner.    Influenza A by PCR NEGATIVE NEGATIVE Final   Influenza B by PCR NEGATIVE NEGATIVE Final    Comment: (NOTE) The Xpert Xpress SARS-CoV-2/FLU/RSV assay is intended as an aid in  the diagnosis of influenza from Nasopharyngeal swab specimens and  should not be used as a sole basis for treatment. Nasal washings and  aspirates are unacceptable for Xpert Xpress SARS-CoV-2/FLU/RSV  testing. Fact Sheet for Patients: PinkCheek.be Fact Sheet for Healthcare Providers: GravelBags.it This test is not yet approved or cleared by the [place] FDA and  has been authorized for detection and/or diagnosis of SARS-CoV-2 by  FDA under an Emergency Use Authorization (EUA). This EUA will remain  in effect (meaning this test can be used) for the duration of the  Covid-19 declaration under Section 564(b)(1) of the Act, 21  U.S.C. section 360bbb-3(b)(1), unless the authorization is  terminated or revoked. Performed at [facility], [place], [place] [address]   SARS CORONAVIRUS 2 (TAT 6-24 HRS) Nasopharyngeal Nasopharyngeal Swab     Status: None   Collection Time: 03/05/20  2:35 PM   Specimen: Nasopharyngeal Swab  Result Value  Ref Range Status   SARS Coronavirus 2 NEGATIVE NEGATIVE Final    Comment: (NOTE) SARS-CoV-2 target nucleic acids are NOT DETECTED. The SARS-CoV-2 RNA is generally detectable in upper and lower respiratory specimens during the acute phase of infection. Negative results do not preclude SARS-CoV-2 infection, do not rule out co-infections with other pathogens, and should not be used as the sole basis for treatment or other patient management decisions. Negative results must be combined with clinical observations, patient history, and epidemiological information. The expected result is Negative. Fact Sheet for Patients: SugarRoll.be Fact Sheet for Healthcare Providers: https://www.woods-mathews.com/ This test is not yet approved or cleared by the [place] FDA and  has been authorized for detection and/or diagnosis of SARS-CoV-2 by FDA under an Emergency Use Authorization (EUA). This EUA will remain  in effect (meaning this test can be used) for the duration of the COVID-19 declaration under Section 56 4(b)(1) of the Act, 21 U.S.C. section 360bbb-3(b)(1), unless the authorization is terminated or revoked sooner. Performed at [facility], [place] [address] [place], [place] [id number]      Time coordinating discharge: Over 30 minutes  SIGNED:   Sidney Ace, MD  [facility] 03/07/2020, 1:49 PM Pager   If 7PM-7AM, please contact night-coverage

## 2020-03-07 NOTE — Progress Notes (Signed)
Lowry Bowl to be D/C'd Home per MD order.  Discussed prescriptions and follow up appointments with the patient. Prescriptions given to patient, medication list explained in detail. Pt verbalized understanding.  Allergies as of 03/07/2020   No Known Allergies     Medication List    TAKE these medications   amLODipine 5 MG tablet Commonly known as: NORVASC Take 1 tablet (5 mg total) by mouth 2 (two) times daily.   aspirin 81 MG EC tablet Take 1 tablet (81 mg total) by mouth daily.   azithromycin 250 MG tablet Commonly known as: ZITHROMAX Take 1 tab by mouth on 3/11 to complete course Start taking on: March 08, 2020   benzonatate 100 MG capsule Commonly known as: TESSALON Take 1 capsule (100 mg total) by mouth 3 (three) times daily for 7 days.   folic acid 1 MG tablet Commonly known as: FOLVITE Take 1 tablet (1 mg total) by mouth daily.   levofloxacin 750 MG tablet Commonly known as: Levaquin Take 1 tablet (750 mg total) by mouth daily for 4 days. Start taking on: March 08, 2020   metoprolol tartrate 25 MG tablet Commonly known as: LOPRESSOR Take 1 tablet (25 mg total) by mouth 2 (two) times daily.   multivitamin with minerals Tabs tablet Take 1 tablet by mouth daily.   naproxen 500 MG tablet Commonly known as: Naprosyn Take 1 tablet (500 mg total) by mouth 2 (two) times daily with a meal.   predniSONE 20 MG tablet Commonly known as: Deltasone Take 1 tablet (20 mg total) by mouth daily for 4 days.   thiamine 100 MG tablet Take 1 tablet (100 mg total) by mouth daily.            Durable Medical Equipment  (From admission, onward)         Start     Ordered   03/07/20 0721  For home use only DME oxygen  Once    Question Answer Comment  Length of Need 6 Months   Mode or (Route) Nasal cannula   Liters per Minute 2   Frequency Continuous (stationary and portable oxygen unit needed)   Oxygen conserving device Yes   Oxygen delivery system Gas      03/07/20  0720          Vitals:   03/07/20 0941 03/07/20 1151  BP: 108/60 120/66  Pulse: 82 85  Resp:    Temp:  98.9 F (37.2 C)  SpO2:  93%    Skin clean, dry and intact without evidence of skin break down, no evidence of skin tears noted. IV catheters discontinued intact. Sites without signs and symptoms of complications. Dressing and pressure applied. Pt denies pain at this time. No complaints noted.  An After Visit Summary was printed and given to the patient. Patient escorted via [place], and D/C home via private auto.  Marry Guan 03/07/2020 3:38 PM

## 2020-03-07 NOTE — Telephone Encounter (Signed)
Again today left a message for the patient's wife with an update.  I tried to reach patient's daughter-phone rings/unavailability of a message.   C-please schedule appointment- on 3/16 at 9:45; labs- cbc/cmp/LDH [hospital follow-up]

## 2020-03-08 ENCOUNTER — Telehealth: Payer: Self-pay | Admitting: Internal Medicine

## 2020-03-08 NOTE — Addendum Note (Signed)
Addended by: Gloris Ham on: 03/08/2020 08:32 AM   Modules accepted: Orders

## 2020-03-08 NOTE — Telephone Encounter (Signed)
New patient hospital follow-up/cancer lymph nodes  C-follow up on 3/16 at 11am- MD; labs- cbc/cmp- Dr.B

## 2020-03-09 ENCOUNTER — Encounter: Payer: Self-pay | Admitting: *Deleted

## 2020-03-09 ENCOUNTER — Emergency Department: Payer: Medicare HMO

## 2020-03-09 ENCOUNTER — Other Ambulatory Visit: Payer: Self-pay

## 2020-03-09 ENCOUNTER — Inpatient Hospital Stay (HOSPITAL_COMMUNITY)
Admission: EM | Admit: 2020-03-09 | Discharge: 2020-03-14 | Disposition: A | Discharge status: Hospice | Payer: Medicare HMO | Source: Home / Self Care | Attending: Internal Medicine | Admitting: Internal Medicine

## 2020-03-09 DIAGNOSIS — J188 Other pneumonia, unspecified organism: Principal | ICD-10-CM | POA: Diagnosis present

## 2020-03-09 DIAGNOSIS — Z7982 Long term (current) use of aspirin: Secondary | ICD-10-CM

## 2020-03-09 DIAGNOSIS — E876 Hypokalemia: Secondary | ICD-10-CM | POA: Diagnosis present

## 2020-03-09 DIAGNOSIS — C7951 Secondary malignant neoplasm of bone: Secondary | ICD-10-CM | POA: Diagnosis present

## 2020-03-09 DIAGNOSIS — I10 Essential (primary) hypertension: Secondary | ICD-10-CM | POA: Diagnosis present

## 2020-03-09 DIAGNOSIS — D72828 Other elevated white blood cell count: Secondary | ICD-10-CM | POA: Diagnosis present

## 2020-03-09 DIAGNOSIS — M545 Low back pain, unspecified: Secondary | ICD-10-CM

## 2020-03-09 DIAGNOSIS — R0602 Shortness of breath: Secondary | ICD-10-CM

## 2020-03-09 DIAGNOSIS — C342 Malignant neoplasm of middle lobe, bronchus or lung: Secondary | ICD-10-CM | POA: Diagnosis present

## 2020-03-09 DIAGNOSIS — J189 Pneumonia, unspecified organism: Secondary | ICD-10-CM | POA: Diagnosis not present

## 2020-03-09 DIAGNOSIS — R7401 Elevation of levels of liver transaminase levels: Secondary | ICD-10-CM | POA: Diagnosis not present

## 2020-03-09 DIAGNOSIS — C3490 Malignant neoplasm of unspecified part of unspecified bronchus or lung: Secondary | ICD-10-CM | POA: Diagnosis not present

## 2020-03-09 DIAGNOSIS — J9621 Acute and chronic respiratory failure with hypoxia: Secondary | ICD-10-CM | POA: Diagnosis present

## 2020-03-09 DIAGNOSIS — Z515 Encounter for palliative care: Secondary | ICD-10-CM | POA: Diagnosis not present

## 2020-03-09 DIAGNOSIS — Z23 Encounter for immunization: Secondary | ICD-10-CM

## 2020-03-09 DIAGNOSIS — J432 Centrilobular emphysema: Secondary | ICD-10-CM | POA: Diagnosis present

## 2020-03-09 DIAGNOSIS — D649 Anemia, unspecified: Secondary | ICD-10-CM | POA: Diagnosis present

## 2020-03-09 DIAGNOSIS — Z87891 Personal history of nicotine dependence: Secondary | ICD-10-CM

## 2020-03-09 DIAGNOSIS — Z79899 Other long term (current) drug therapy: Secondary | ICD-10-CM

## 2020-03-09 DIAGNOSIS — F17210 Nicotine dependence, cigarettes, uncomplicated: Secondary | ICD-10-CM | POA: Diagnosis not present

## 2020-03-09 DIAGNOSIS — Z66 Do not resuscitate: Secondary | ICD-10-CM | POA: Diagnosis present

## 2020-03-09 DIAGNOSIS — I4891 Unspecified atrial fibrillation: Secondary | ICD-10-CM | POA: Diagnosis not present

## 2020-03-09 DIAGNOSIS — Z20822 Contact with and (suspected) exposure to covid-19: Secondary | ICD-10-CM | POA: Diagnosis present

## 2020-03-09 DIAGNOSIS — C642 Malignant neoplasm of left kidney, except renal pelvis: Secondary | ICD-10-CM | POA: Diagnosis not present

## 2020-03-09 DIAGNOSIS — J9620 Acute and chronic respiratory failure, unspecified whether with hypoxia or hypercapnia: Secondary | ICD-10-CM | POA: Diagnosis not present

## 2020-03-09 DIAGNOSIS — R0902 Hypoxemia: Secondary | ICD-10-CM

## 2020-03-09 DIAGNOSIS — I313 Pericardial effusion (noninflammatory): Secondary | ICD-10-CM | POA: Diagnosis not present

## 2020-03-09 DIAGNOSIS — N400 Enlarged prostate without lower urinary tract symptoms: Secondary | ICD-10-CM | POA: Diagnosis present

## 2020-03-09 DIAGNOSIS — J440 Chronic obstructive pulmonary disease with acute lower respiratory infection: Secondary | ICD-10-CM | POA: Diagnosis present

## 2020-03-09 DIAGNOSIS — C770 Secondary and unspecified malignant neoplasm of lymph nodes of head, face and neck: Secondary | ICD-10-CM | POA: Diagnosis not present

## 2020-03-09 HISTORY — DX: Pneumonia, unspecified organism: J18.9

## 2020-03-09 LAB — CBC WITH DIFFERENTIAL/PLATELET
Abs Immature Granulocytes: 0.34 10*3/uL — ABNORMAL HIGH (ref 0.00–0.07)
Basophils Absolute: 0.1 10*3/uL (ref 0.0–0.1)
Basophils Relative: 0 %
Eosinophils Absolute: 0.1 10*3/uL (ref 0.0–0.5)
Eosinophils Relative: 1 %
HCT: 39.9 % (ref 39.0–52.0)
Hemoglobin: 13.7 g/dL (ref 13.0–17.0)
Immature Granulocytes: 2 %
Lymphocytes Relative: 9 %
Lymphs Abs: 1.5 10*3/uL (ref 0.7–4.0)
MCH: 32.5 pg (ref 26.0–34.0)
MCHC: 34.3 g/dL (ref 30.0–36.0)
MCV: 94.8 fL (ref 80.0–100.0)
Monocytes Absolute: 1.3 10*3/uL — ABNORMAL HIGH (ref 0.1–1.0)
Monocytes Relative: 8 %
Neutro Abs: 13.7 10*3/uL — ABNORMAL HIGH (ref 1.7–7.7)
Neutrophils Relative %: 80 %
Platelets: 323 10*3/uL (ref 150–400)
RBC: 4.21 MIL/uL — ABNORMAL LOW (ref 4.22–5.81)
RDW: 13.1 % (ref 11.5–15.5)
WBC: 17 10*3/uL — ABNORMAL HIGH (ref 4.0–10.5)
nRBC: 0 % (ref 0.0–0.2)

## 2020-03-09 LAB — COMPREHENSIVE METABOLIC PANEL
ALT: 73 U/L — ABNORMAL HIGH (ref 0–44)
AST: 75 U/L — ABNORMAL HIGH (ref 15–41)
Albumin: 2.4 g/dL — ABNORMAL LOW (ref 3.5–5.0)
Alkaline Phosphatase: 181 U/L — ABNORMAL HIGH (ref 38–126)
Anion gap: 12 (ref 5–15)
BUN: 32 mg/dL — ABNORMAL HIGH (ref 8–23)
CO2: 25 mmol/L (ref 22–32)
Calcium: 11 mg/dL — ABNORMAL HIGH (ref 8.9–10.3)
Chloride: 101 mmol/L (ref 98–111)
Creatinine, Ser: 1.19 mg/dL (ref 0.61–1.24)
GFR calc Af Amer: >60 mL/min (ref >60)
GFR calc non Af Amer: 60 mL/min — ABNORMAL LOW (ref >60)
Glucose, Bld: 88 mg/dL (ref 70–99)
Potassium: 3.2 mmol/L — ABNORMAL LOW (ref 3.5–5.1)
Sodium: 138 mmol/L (ref 135–145)
Total Bilirubin: 0.9 mg/dL (ref 0.3–1.2)
Total Protein: 7.2 g/dL (ref 6.5–8.1)

## 2020-03-09 LAB — CULTURE, BLOOD (ROUTINE X 2)
Culture: NO GROWTH
Culture: NO GROWTH
Special Requests: ADEQUATE
Special Requests: ADEQUATE

## 2020-03-09 LAB — LACTIC ACID, PLASMA: Lactic Acid, Venous: 1.7 mmol/L (ref 0.5–1.9)

## 2020-03-09 LAB — RESPIRATORY PANEL BY RT PCR (FLU A&B, COVID)
Influenza A by PCR: NEGATIVE
Influenza B by PCR: NEGATIVE
SARS Coronavirus 2 by RT PCR: NEGATIVE

## 2020-03-09 LAB — PROTIME-INR
INR: 1.4 — ABNORMAL HIGH (ref 0.8–1.2)
Prothrombin Time: 16.8 seconds — ABNORMAL HIGH (ref 11.4–15.2)

## 2020-03-09 MED ORDER — BISACODYL 5 MG PO TBEC
5.0000 mg | DELAYED_RELEASE_TABLET | Freq: Every day | ORAL | Status: DC | PRN
  Administered 2020-03-11: 5 mg via ORAL
  Filled 2020-03-09: qty 1

## 2020-03-09 MED ORDER — SODIUM CHLORIDE 0.9 % IV SOLN
2.0000 g | Freq: Two times a day (BID) | INTRAVENOUS | Status: DC
  Administered 2020-03-09 – 2020-03-11 (×4): 2 g via INTRAVENOUS
  Filled 2020-03-09 (×6): qty 2

## 2020-03-09 MED ORDER — SODIUM CHLORIDE 0.9% FLUSH
3.0000 mL | Freq: Two times a day (BID) | INTRAVENOUS | Status: DC
  Administered 2020-03-09 – 2020-03-13 (×9): 3 mL via INTRAVENOUS

## 2020-03-09 MED ORDER — ONDANSETRON HCL 4 MG PO TABS: 4.0000 mg | ORAL_TABLET | Freq: Four times a day (QID) | ORAL | Status: DC | PRN

## 2020-03-09 MED ORDER — SODIUM CHLORIDE 0.9 % IV SOLN
1.0000 g | Freq: Once | INTRAVENOUS | Status: AC
  Administered 2020-03-09: 1 g via INTRAVENOUS
  Filled 2020-03-09: qty 1

## 2020-03-09 MED ORDER — MORPHINE SULFATE (PF) 2 MG/ML IV SOLN
2.0000 mg | INTRAVENOUS | Status: DC | PRN
  Administered 2020-03-09 – 2020-03-11 (×4): 2 mg via INTRAVENOUS
  Filled 2020-03-09 (×6): qty 1

## 2020-03-09 MED ORDER — INFLUENZA VAC A&B SA ADJ QUAD 0.5 ML IM PRSY
0.5000 mL | PREFILLED_SYRINGE | INTRAMUSCULAR | Status: DC
  Filled 2020-03-09: qty 0.5

## 2020-03-09 MED ORDER — SODIUM CHLORIDE 0.9 % IV BOLUS
1000.0000 mL | Freq: Once | INTRAVENOUS | Status: AC
  Administered 2020-03-09: 1000 mL via INTRAVENOUS

## 2020-03-09 MED ORDER — KETOROLAC TROMETHAMINE 10 MG PO TABS
10.0000 mg | ORAL_TABLET | Freq: Four times a day (QID) | ORAL | Status: DC | PRN
  Administered 2020-03-10 (×3): 10 mg via ORAL
  Filled 2020-03-09 (×5): qty 1

## 2020-03-09 MED ORDER — BENZONATATE 100 MG PO CAPS
200.0000 mg | ORAL_CAPSULE | Freq: Three times a day (TID) | ORAL | Status: DC
  Administered 2020-03-09 – 2020-03-14 (×14): 200 mg via ORAL
  Filled 2020-03-09 (×14): qty 2

## 2020-03-09 MED ORDER — HYDRALAZINE HCL 50 MG PO TABS
50.0000 mg | ORAL_TABLET | Freq: Four times a day (QID) | ORAL | Status: DC | PRN
  Administered 2020-03-10: 50 mg via ORAL
  Filled 2020-03-09: qty 1

## 2020-03-09 MED ORDER — POTASSIUM CHLORIDE CRYS ER 20 MEQ PO TBCR
40.0000 meq | EXTENDED_RELEASE_TABLET | Freq: Once | ORAL | Status: AC
  Administered 2020-03-09: 40 meq via ORAL
  Filled 2020-03-09: qty 2

## 2020-03-09 MED ORDER — PNEUMOCOCCAL VAC POLYVALENT 25 MCG/0.5ML IJ INJ: 0.5000 mL | INJECTION | INTRAMUSCULAR | Status: DC

## 2020-03-09 MED ORDER — ONDANSETRON HCL 4 MG/2ML IJ SOLN
4.0000 mg | Freq: Once | INTRAMUSCULAR | Status: AC
  Administered 2020-03-09: 4 mg via INTRAVENOUS
  Filled 2020-03-09: qty 2

## 2020-03-09 MED ORDER — IOHEXOL 300 MG/ML  SOLN
75.0000 mL | Freq: Once | INTRAMUSCULAR | Status: AC | PRN
  Administered 2020-03-09: 75 mL via INTRAVENOUS

## 2020-03-09 MED ORDER — ONDANSETRON HCL 4 MG/2ML IJ SOLN
4.0000 mg | Freq: Four times a day (QID) | INTRAMUSCULAR | Status: DC | PRN
  Administered 2020-03-11 – 2020-03-12 (×2): 4 mg via INTRAVENOUS
  Filled 2020-03-09 (×3): qty 2

## 2020-03-09 MED ORDER — SODIUM CHLORIDE 0.9 % IV SOLN
500.0000 mg | Freq: Once | INTRAVENOUS | Status: AC
  Administered 2020-03-09: 500 mg via INTRAVENOUS
  Filled 2020-03-09: qty 500

## 2020-03-09 MED ORDER — IPRATROPIUM-ALBUTEROL 0.5-2.5 (3) MG/3ML IN SOLN
3.0000 mL | RESPIRATORY_TRACT | Status: DC | PRN
  Administered 2020-03-11: 3 mL via RESPIRATORY_TRACT
  Filled 2020-03-09: qty 3

## 2020-03-09 MED ORDER — MORPHINE SULFATE (PF) 4 MG/ML IV SOLN
4.0000 mg | Freq: Once | INTRAVENOUS | Status: AC
  Administered 2020-03-09: 4 mg via INTRAVENOUS
  Filled 2020-03-09: qty 1

## 2020-03-09 MED ORDER — OXYCODONE HCL 5 MG PO TABS
5.0000 mg | ORAL_TABLET | Freq: Four times a day (QID) | ORAL | Status: DC | PRN
  Administered 2020-03-09 – 2020-03-10 (×3): 5 mg via ORAL
  Filled 2020-03-09 (×3): qty 1

## 2020-03-09 MED ORDER — ENOXAPARIN SODIUM 40 MG/0.4ML ~~LOC~~ SOLN
40.0000 mg | SUBCUTANEOUS | Status: DC
  Administered 2020-03-09 – 2020-03-12 (×4): 40 mg via SUBCUTANEOUS
  Filled 2020-03-09 (×4): qty 0.4

## 2020-03-09 MED ORDER — NICOTINE 7 MG/24HR TD PT24
7.0000 mg | MEDICATED_PATCH | TRANSDERMAL | Status: DC
  Administered 2020-03-09 – 2020-03-10 (×2): 7 mg via TRANSDERMAL
  Filled 2020-03-09 (×3): qty 1

## 2020-03-09 MED ORDER — AMLODIPINE BESYLATE 10 MG PO TABS
10.0000 mg | ORAL_TABLET | Freq: Every day | ORAL | Status: DC
  Administered 2020-03-09 – 2020-03-11 (×3): 10 mg via ORAL
  Filled 2020-03-09: qty 1
  Filled 2020-03-09: qty 2
  Filled 2020-03-09: qty 1

## 2020-03-09 MED ORDER — SODIUM CHLORIDE 0.9 % IV SOLN
INTRAVENOUS | Status: DC | PRN
  Administered 2020-03-09 – 2020-03-10 (×2): 250 mL via INTRAVENOUS

## 2020-03-09 MED ORDER — ACETAMINOPHEN 325 MG PO TABS: 650.0000 mg | ORAL_TABLET | Freq: Four times a day (QID) | ORAL | Status: DC | PRN

## 2020-03-09 MED ORDER — KETOROLAC TROMETHAMINE 10 MG PO TABS
10.0000 mg | ORAL_TABLET | Freq: Four times a day (QID) | ORAL | Status: DC | PRN
  Filled 2020-03-09: qty 1

## 2020-03-09 MED ORDER — METOPROLOL TARTRATE 25 MG PO TABS
25.0000 mg | ORAL_TABLET | Freq: Two times a day (BID) | ORAL | Status: DC
  Administered 2020-03-09 – 2020-03-14 (×9): 25 mg via ORAL
  Filled 2020-03-09 (×9): qty 1

## 2020-03-09 MED ORDER — SODIUM CHLORIDE 0.9 % IV SOLN
1.0000 g | Freq: Once | INTRAVENOUS | Status: DC
  Filled 2020-03-09: qty 10

## 2020-03-09 MED ORDER — ACETAMINOPHEN 650 MG RE SUPP: 650.0000 mg | Freq: Four times a day (QID) | RECTAL | Status: DC | PRN

## 2020-03-09 MED ORDER — SODIUM CHLORIDE 0.9 % IV SOLN
500.0000 mg | INTRAVENOUS | Status: DC
  Administered 2020-03-10 – 2020-03-13 (×4): 500 mg via INTRAVENOUS
  Filled 2020-03-09 (×5): qty 500

## 2020-03-09 NOTE — ED Notes (Signed)
Pt c/o increasing SHOB and has increased restlessness.  O2 sats also decreased.  MD notified, verbal order to give pt ordered morphine at this time.

## 2020-03-09 NOTE — Telephone Encounter (Signed)
Dr. Rogue Bussing - patient is currently in the ER.

## 2020-03-09 NOTE — ED Triage Notes (Signed)
Per EMS report, patient was discharged two days ago with pneumonia and discharged with home O2. Patient was on 2L O2 via [place] at home, and per EMT report dropped to 87% on Room air with weight-bearing. Patient was placed on 6L O2 via [place] by EMT and was 95%. Patient is 96% on 3L in triage. Patient c/o right mid-back pain. Swelling noted on right flank. Patient's WBC increased to 19.4 on day of discharge. Patient hasn't been able to get discharge prescriptions filled.

## 2020-03-09 NOTE — Telephone Encounter (Addendum)
Multiple attempts have been made to reach patient including his emergency contact.  I personally Attempted to reach Left msg for Deontray Hunnicutt (pt's wife). Unable to leave a vm - mail box is full.  I left a vm for the patient at [phone]- requesting a return phone call.  Contacted emergency contact [employer]. Unable to leave vm.

## 2020-03-09 NOTE — ED Provider Notes (Signed)
[facility] Emergency Department Provider Note  ____________________________________________   First MD Initiated Contact with Patient 03/09/20 1320     (approximate)  I have reviewed the triage vital signs and the nursing notes.  History  Chief Complaint Back Pain    HPI Troy Shepherd is a 74 y.o. male past medical history as below, recently admitted for CAP, who presents to the emergency department for right lower back pain.  Patient states he woke up this morning and noticed pain to the right flank/lower back area associated with a focal area of swelling.  Pain is 4/10 in severity, nagging in description, no radiation, no alleviating or aggravating components.  He denies any direct trauma or injury to the area.  No history of similar symptoms.  Denies any swelling to this area yesterday, symptoms were all noticed and seemed to have started acutely this morning.  Also reports ongoing shortness of breath since his discharge for CAP. No chest pain or cough.  He was reportedly discharged on 2 L nasal cannula, but was increased to 3 L in triage for mild hypoxia.  He was discharged with plans to complete antibiotic course, however he states he has not been able to pick up any of these medications and therefore has not taken them.  His hospitalization was otherwise also complicated by mediastinal and hilar lymphadenopathy seen on CT.  He underwent a lymph node biopsy on 3/10, supraclavicular location. Concerning for underlying malignancy.    Past Medical Hx Past Medical History:  Diagnosis Date  . Alcohol abuse    quit 09/2014  . Arthritis   . Hypertension   . Leukopenia    resolved when stopped drinking 09/2014  . Pneumonia     Problem List Patient Active Problem List   Diagnosis Date Noted  . Mediastinal lymphadenopathy 03/06/2020  . Abnormal CT of the chest   . COPD with acute exacerbation ([place])   . Protein-calorie malnutrition, severe 03/05/2020  .  Elevated d-dimer   . Bilateral pneumonia 03/04/2020  . Acute respiratory failure with hypoxia ([place])   . Elevated troponin   . 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 Tobacco abuse   . Tobacco abuse counseling   . Chest pain 03/27/2018  . Abnormal EKG 03/27/2018  . Anemia 03/27/2018  . Acute viral syndrome 05/09/2017  . Tick bite of groin 05/09/2017  . Generalized weakness 05/09/2017  . Ambulatory dysfunction 05/09/2017  . Alcohol dependence ([place]) 03/05/2017  . Sepsis ([place]) 01/06/2016  . Hypokalemia 01/06/2016  . HTN (hypertension) 01/06/2016  . Nausea and vomiting 01/06/2016  . Dehydration 01/06/2016    Past Surgical Hx Past Surgical History:  Procedure Laterality Date  . APPENDECTOMY    . COLONOSCOPY N/A 11/09/2015   Procedure: COLONOSCOPY;  Surgeon: Josefine Class, MD;  Location: [facility] ENDOSCOPY;  Service: Endoscopy;  Laterality: N/A;    Medications Prior to Admission medications   Medication Sig Start Date End Date Taking? Authorizing Provider  amLODipine (NORVASC) 5 MG tablet Take 1 tablet (5 mg total) by mouth 2 (two) times daily. Patient not taking: Reported on 03/05/2020 03/29/18   Loletha Grayer, MD  aspirin EC 81 MG EC tablet Take 1 tablet (81 mg total) by mouth daily. Patient not taking: Reported on 03/05/2020 03/29/18   Loletha Grayer, MD  azithromycin [facility]) 250 MG tablet Take 1 tab by mouth on 3/11 to complete course 03/08/20   Ralene Muskrat B, MD  benzonatate (TESSALON) 100 MG capsule Take  1 capsule (100 mg total) by mouth 3 (three) times daily for 7 days. 03/07/20 03/14/20  Sidney Ace, MD  folic acid (FOLVITE) 1 MG tablet Take 1 tablet (1 mg total) by mouth daily. Patient not taking: Reported on 03/05/2020 03/29/18   Loletha Grayer, MD  levofloxacin (LEVAQUIN) 750 MG tablet Take 1 tablet (750 mg total) by mouth daily for 4 days. 03/08/20 03/12/20  Sidney Ace, MD  metoprolol tartrate (LOPRESSOR) 25 MG tablet Take 1  tablet (25 mg total) by mouth 2 (two) times daily. Patient not taking: Reported on 03/05/2020 03/29/18   Loletha Grayer, MD  Multiple Vitamin (MULTIVITAMIN WITH MINERALS) TABS tablet Take 1 tablet by mouth daily. Patient not taking: Reported on 03/05/2020 03/29/18   Loletha Grayer, MD  naproxen (NAPROSYN) 500 MG tablet Take 1 tablet (500 mg total) by mouth 2 (two) times daily with a meal. Patient not taking: Reported on 03/05/2020 03/09/19   Lavonia Drafts, MD  predniSONE (DELTASONE) 20 MG tablet Take 1 tablet (20 mg total) by mouth daily for 4 days. 03/07/20 03/11/20  Sidney Ace, MD  thiamine 100 MG tablet Take 1 tablet (100 mg total) by mouth daily. 03/29/18   Loletha Grayer, MD    Allergies Patient has no known allergies.  Family Hx Family History  Problem Relation Age of Onset  . COPD Sister     Social Hx Social History   Tobacco Use  . Smoking status: Former Smoker    Packs/day: 0.10    Years: 40.00    Pack years: 4.00    Types: Cigarettes    Quit date: 03/07/2020  . Smokeless tobacco: Never Used  Substance Use Topics  . Alcohol use: Yes    Alcohol/week: 3.0 standard drinks    Types: 3 Shots of liquor per week    Comment: per pt drinks twice/d 5 days a wk  . Drug use: No     Review of Systems  Constitutional: Negative for fever, chills. Eyes: Negative for visual changes. ENT: Negative for sore throat. Cardiovascular: Negative for chest pain. Respiratory: + for shortness of breath. Gastrointestinal: Negative for nausea, vomiting.  Genitourinary: Negative for dysuria. Musculoskeletal: Negative for leg swelling. + back pain and swelling Skin: Negative for rash. Neurological: Negative for headaches.   Physical Exam  Vital Signs: ED Triage Vitals  Enc Vitals Group     BP --      Pulse Rate 03/09/20 1158 92     Resp 03/09/20 1158 20     Temp 03/09/20 1158 98.2 F (36.8 C)     Temp Source 03/09/20 1158 Oral     SpO2 03/09/20 1157 95 %     Weight 03/09/20  1204 145 lb (65.8 kg)     Height 03/09/20 1204 5\' 7"  (1.702 m)     Head Circumference --      Peak Flow --      Pain Score 03/09/20 1203 4     Pain Loc --      Pain Edu? --      Excl. in [place]? --     Constitutional: Alert and oriented. Thin. Head: Normocephalic. Atraumatic. Eyes: Conjunctivae clear, sclera anicteric. Pupils equal and symmetric. Nose: No masses or lesions. No congestion or rhinorrhea. Mouth/Throat: Wearing mask.  Neck: No stridor. Trachea midline.  Cardiovascular: Normal rate, regular rhythm. Extremities well perfused. Respiratory: Coarse lung sounds bilaterally, more predominant on R. On 3-4 L [place]. Gastrointestinal: Soft. Non-distended. Non-tender.  Genitourinary: Deferred. Musculoskeletal: No lower  extremity edema. No deformities. Neurologic:  Normal speech and language. No gross focal or lateralizing neurologic deficits are appreciated.  Skin: Localized area of egg sized swelling to the R flank area just superior to the iliac crest. Mildly tender. Not a distinctly mobile component. No fluctuance. No erythema, warmth, or drainage.  Psychiatric: Mood and affect are appropriate for situation.  EKG  Personally reviewed and interpreted by myself.   Rate: 95 Rhythm: sinus Axis: normal Intervals: WNL No acute ischemic changes No STEMI    Radiology  CXR:  IMPRESSION:  Bilateral airspace opacities again noted with increased RIGHT middle  lobe consolidation/atelectasis.   CT chest/abdomen/pelvis: IMPRESSION:  1. 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 The  primary malignancy is not confidently identified on today's  examination, however, given the mass-like appearance of the right  infrahilar region with the postobstructive changes in the right    middle lobe, a primary bronchogenic carcinoma in the central right  middle lobe is suspected.  2. Colonic diverticulosis without evidence of acute diverticulitis  at this time.  3. 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  4. Severe prostatomegaly.  5. Aortic atherosclerosis, in addition to left main and 3 vessel  coronary artery disease. Assessment for potential risk factor  modification, dietary therapy or pharmacologic therapy may be  warranted, if clinically indicated.  6. Additional incidental findings, as above.     Procedures  Procedure(s) performed (including critical care):  Procedures   Initial Impression / Assessment and Plan / MDM / ED Course  74 y.o. male who presents to the ED for back pain (with related localized swelling) as well as conintued SOB in setting of bilateral PNA, not on his discharge antibiotics.   Will obtain labs, as well as CT imaging of his chest for further characterization of his recent, seemingly worsening (by XR) PNA. Also obtain CT imaging of A/P for evaluation of his back pain and swelling to rule out any malignant involvement.   Imaging as above - concern his PNA is more likely a post obstructive rather than CAP due to malignancy. In setting of likely functionally immunocompromised (given concern for widespread underlying cancer) will expand his antibiotics to cefepime and azithromycin. WBC 17. Normal lactic acid. Slightly elevated LFTs and calcium as well, perhaps 2/2 malignancy. Given above, including his increased oxygen requirement, will admit. D/w hospitalist for admission. Patient agreeable.     _______________________________  As part of my medical decision making I have reviewed available labs, radiology tests, reviewed old records.  Final Clinical Impression(s) / ED Diagnosis  Final diagnoses:  Acute right-sided low back pain without sciatica  Shortness of breath  Hypoxia       Note:  This  document was prepared using Dragon voice recognition software and may include unintentional dictation errors.   Lilia Pro., MD 03/09/20 [phone]

## 2020-03-09 NOTE — ED Notes (Signed)
Pt more comfortable at this time, will continue to monitor.

## 2020-03-09 NOTE — H&P (Signed)
History and Physical    Troy Shepherd KVQ:[id number] DOB: 27-Apr-1946 DOA: 03/09/2020  PCP: Theotis Burrow, MD  Patient coming from: home    Chief Complaint: shortness of breath, back pain  HPI: 74 y/o M w/ PMH of HTN who presents shortness of breath & back pain x day of admission. The shortness of breath is at rest as well as with exertion. The shortness of breath is worse with laying down. Of note, pt was just d/c from here on 03/07/20 w/ 2L [place]. Pt was also d/c w/ abxs, steroids for which he did fill as thought the medications were being sent to his "house." Pt also c/o cough. Furthermore, pt c/o lower back pain. The pain is dull, intermittent w/o radiation. Pain meds makes the pain better and nothing makes the pain worse. The severity is currently 4/10. Pt denies any fever, chills, sweating, dizziness, lightheadedness, chest pain, nausea, vomiting, abd pain, dysuria, urinary frequency, urinary urgency, diarrhea or constipation.  Review of Systems: As per HPI otherwise 10 point review of systems negative.    Past Medical History:  Diagnosis Date  . Alcohol abuse    quit 09/2014  . Arthritis   . Hypertension   . Leukopenia    resolved when stopped drinking 09/2014  . Pneumonia     Past Surgical History:  Procedure Laterality Date  . APPENDECTOMY    . COLONOSCOPY N/A 11/09/2015   Procedure: COLONOSCOPY;  Surgeon: Josefine Class, MD;  Location: [facility] ENDOSCOPY;  Service: Endoscopy;  Laterality: N/A;     reports that he quit smoking 2 days ago. His smoking use included cigarettes. He has a 4.00 pack-year smoking history. He has never used smokeless tobacco. He reports current alcohol use of about 3.0 standard drinks of alcohol per week. He reports that he does not use drugs.  No Known Allergies  Family History  Problem Relation Age of Onset  . COPD Sister      Prior to Admission medications   Medication Sig Start Date End Date Taking? Authorizing Provider    amLODipine (NORVASC) 5 MG tablet Take 1 tablet (5 mg total) by mouth 2 (two) times daily. Patient not taking: Reported on 03/05/2020 03/29/18   Loletha Grayer, MD  aspirin EC 81 MG EC tablet Take 1 tablet (81 mg total) by mouth daily. Patient not taking: Reported on 03/05/2020 03/29/18   Loletha Grayer, MD  azithromycin [facility]) 250 MG tablet Take 1 tab by mouth on 3/11 to complete course 03/08/20   Ralene Muskrat B, MD  benzonatate (TESSALON) 100 MG capsule Take 1 capsule (100 mg total) by mouth 3 (three) times daily for 7 days. 03/07/20 03/14/20  Sidney Ace, MD  folic acid (FOLVITE) 1 MG tablet Take 1 tablet (1 mg total) by mouth daily. Patient not taking: Reported on 03/05/2020 03/29/18   Loletha Grayer, MD  levofloxacin (LEVAQUIN) 750 MG tablet Take 1 tablet (750 mg total) by mouth daily for 4 days. 03/08/20 03/12/20  Sidney Ace, MD  metoprolol tartrate (LOPRESSOR) 25 MG tablet Take 1 tablet (25 mg total) by mouth 2 (two) times daily. Patient not taking: Reported on 03/05/2020 03/29/18   Loletha Grayer, MD  Multiple Vitamin (MULTIVITAMIN WITH MINERALS) TABS tablet Take 1 tablet by mouth daily. Patient not taking: Reported on 03/05/2020 03/29/18   Loletha Grayer, MD  naproxen (NAPROSYN) 500 MG tablet Take 1 tablet (500 mg total) by mouth 2 (two) times daily with a meal. Patient not taking: Reported on 03/05/2020 03/09/19  Lavonia Drafts, MD  predniSONE (DELTASONE) 20 MG tablet Take 1 tablet (20 mg total) by mouth daily for 4 days. 03/07/20 03/11/20  Sidney Ace, MD  thiamine 100 MG tablet Take 1 tablet (100 mg total) by mouth daily. 03/29/18   Loletha Grayer, MD    Physical Exam: Vitals:   03/09/20 1158 03/09/20 1204 03/09/20 1430 03/09/20 1500  BP:   (!) 194/72 (!) 171/81  Pulse: 92  (!) 103 87  Resp: 20  (!) 22 (!) 22  Temp: 98.2 F (36.8 C)     TempSrc: Oral     SpO2: 96%  96% 95%  Weight:  65.8 kg    Height:  5\' 7"  (1.702 m)      Constitutional: NAD, calm,  comfortable Vitals:   03/09/20 1158 03/09/20 1204 03/09/20 1430 03/09/20 1500  BP:   (!) 194/72 (!) 171/81  Pulse: 92  (!) 103 87  Resp: 20  (!) 22 (!) 22  Temp: 98.2 F (36.8 C)     TempSrc: Oral     SpO2: 96%  96% 95%  Weight:  65.8 kg    Height:  5\' 7"  (1.702 m)     Eyes: PERRL, lids and conjunctivae normal ENMT: Mucous membranes are moist. Posterior pharynx clear of any exudate or lesions. Neck: normal, supple Respiratory: course breath sounds b/l.  Cardiovascular: S1/S2 +. no rubs / gallops. No extremity edema. Abdomen:  Soft, no tenderness, non-distended. Bowel sounds positive.  Musculoskeletal: no clubbing / cyanosis. No joint deformity upper and lower extremities. Good ROM, no contractures.  Skin: no rashes, lesions, ulcers.  Neurologic: CN 2-12 grossly intact. Moves all 4 extremities Psychiatric: abNormal judgment and insight. Flat mood and affect     Labs on Admission: I have personally reviewed following labs and imaging studies  CBC: Recent Labs  Lab 03/04/20 1219 03/05/20 0546 03/07/20 0846 03/09/20 1230  WBC 11.3* 13.2* 19.4* 17.0*  NEUTROABS  --   --  15.7* 13.7*  HGB 14.5 13.1 11.9* 13.7  HCT 42.0 38.8* 36.2* 39.9  MCV 95.9 95.8 97.6 94.8  PLT 329 315 290 381   Basic Metabolic Panel: Recent Labs  Lab 03/04/20 1219 03/05/20 0546 03/06/20 0527 03/09/20 1230  NA 135 133* 137 138  K 3.5 3.6 4.1 3.2*  CL 101 102 103 101  CO2 21* 21* 25 25  GLUCOSE 96 114* 120* 88  BUN 29* 36* 38* 32*  CREATININE 1.43* 1.28* 1.20 1.19  CALCIUM 10.8* 9.4 10.4* 11.0*   GFR: Estimated Creatinine Clearance: 50.7 mL/min (by C-G formula based on SCr of 1.19 mg/dL). Liver Function Tests: Recent Labs  Lab 03/09/20 1230  AST 75*  ALT 73*  ALKPHOS 181*  BILITOT 0.9  PROT 7.2  ALBUMIN 2.4*   No results for input(s): LIPASE, AMYLASE in the last 168 hours. No results for input(s): AMMONIA in the last 168 hours. Coagulation Profile: Recent Labs  Lab  03/05/20 1551 03/09/20 1230  INR 1.2 1.4*   Cardiac Enzymes: No results for input(s): CKTOTAL, CKMB, CKMBINDEX, TROPONINI in the last 168 hours. BNP (last 3 results) No results for input(s): PROBNP in the last 8760 hours. HbA1C: No results for input(s): HGBA1C in the last 72 hours. CBG: No results for input(s): GLUCAP in the last 168 hours. Lipid Profile: No results for input(s): CHOL, HDL, LDLCALC, TRIG, CHOLHDL, LDLDIRECT in the last 72 hours. Thyroid Function Tests: No results for input(s): TSH, T4TOTAL, FREET4, T3FREE, THYROIDAB in the last 72 hours. Anemia Panel:  No results for input(s): VITAMINB12, FOLATE, FERRITIN, TIBC, IRON, RETICCTPCT in the last 72 hours. Urine analysis:    Component Value Date/Time   COLORURINE AMBER (A) 03/28/2018 1650   APPEARANCEUR CLEAR (A) 03/28/2018 1650   LABSPEC 1.024 03/28/2018 1650   PHURINE 6.0 03/28/2018 1650   GLUCOSEU NEGATIVE 03/28/2018 1650   HGBUR NEGATIVE 03/28/2018 1650   BILIRUBINUR NEGATIVE 03/28/2018 1650   KETONESUR NEGATIVE 03/28/2018 1650   PROTEINUR NEGATIVE 03/28/2018 1650   NITRITE NEGATIVE 03/28/2018 1650   LEUKOCYTESUR NEGATIVE 03/28/2018 1650    Radiological Exams on Admission: DG Chest 2 View  Result Date: 03/09/2020 CLINICAL DATA:  Follow-up pneumonia. EXAM: CHEST - 2 VIEW COMPARISON:  03/04/2020 FINDINGS: UPPER limits normal heart size again noted. Bilateral airspace opacities are again noted, with increased RIGHT middle lobe consolidation/atelectasis. A trace RIGHT pleural effusion is noted. No pneumothorax or acute bony abnormality. IMPRESSION: Bilateral airspace opacities again noted with increased RIGHT middle lobe consolidation/atelectasis. Electronically Signed   By: Margarette Canada M.D.   On: 03/09/2020 13:12   CT Chest W Contrast  Result Date: 03/09/2020 CLINICAL DATA:  75 year old male with history of pneumonia. Mid right back pain. Swelling in the right flank. EXAM: CT CHEST, ABDOMEN, AND PELVIS WITH  CONTRAST TECHNIQUE: Multidetector CT imaging of the chest, abdomen and pelvis was performed following the standard protocol during bolus administration of intravenous contrast. CONTRAST:  3mL OMNIPAQUE IOHEXOL 300 MG/ML  SOLN COMPARISON:  Chest CT 03/05/2020. CT the abdomen and pelvis 05/01/2004. FINDINGS: CT CHEST FINDINGS Cardiovascular: Heart size is normal. Small amount of pericardial fluid and/or thickening, slightly increased compared to the prior study, but unlikely to be of hemodynamic significance at this time. Atherosclerotic calcifications in the thoracic aorta as well as the left main, left anterior descending, left circumflex and right coronary arteries. Mediastinum/Nodes: Multiple enlarged mediastinal and hilar lymph nodes bilaterally, bulkiest of which is in the low right paratracheal nodal station measuring 2.3 cm in short axis, low left paratracheal nodal station measuring 2.8 cm in short axis, and AP window measuring 1.9 cm in short axis. Hilar lymph nodes measure up to 2 cm in short axis. Esophagus is unremarkable in appearance. Esophagus is unremarkable in appearance. No axillary lymphadenopathy. Lungs/Pleura: Mass-like enlargement of the right infrahilar region best appreciated on axial image 40 of series 504 estimated to measure approximately 4.6 x 4.3 cm with postobstructive changes in the right middle lobe which appears to reflect a combination of consolidation and atelectasis. Multiple other pulmonary nodules are again noted throughout the lungs bilaterally, largest of which is in the right lower lobe measuring 2.5 x 2.1 cm (axial image 126 of series 505). Musculoskeletal: Enumerable lytic lesions throughout the visualized axial and appendicular skeleton, compatible with widespread metastatic disease to the bones. CT ABDOMEN PELVIS FINDINGS Hepatobiliary: 1.5 x 1.3 cm centrally low-attenuation lesion with some peripheral nodular enhancement and progressive centripetal filling in segment 3  of the liver, compatible with a cavernous hemangioma. No other suspicious hepatic lesions. No intra or extrahepatic biliary ductal dilatation. Gallbladder is normal in appearance. Pancreas: No pancreatic mass. No pancreatic ductal dilatation. No pancreatic or peripancreatic fluid collections or inflammatory changes. Spleen: Unremarkable. Adrenals/Urinary Tract: Bilateral kidneys and adrenal glands are normal in appearance. No hydroureteronephrosis. Urinary bladder is normal in appearance. Stomach/Bowel: Normal appearance of the stomach. No pathologic dilatation of small bowel or colon. Several colonic diverticulae are noted, without surrounding inflammatory changes to suggest an acute diverticulitis at this time. The appendix is not confidently identified and  may be surgically absent. Regardless, there are no inflammatory changes noted adjacent to the cecum to suggest the presence of an acute appendicitis at this time. Vascular/Lymphatic: Aortic atherosclerosis, without evidence of aneurysm or dissection noted in the abdominal or pelvic vasculature. No lymphadenopathy noted in the abdomen or pelvis. Reproductive: Prostate gland is enlarged and heterogeneous in appearance measuring 5.8 x 4.3 x 6.1 cm. Other: Small volume of ascites.  No pneumoperitoneum. Musculoskeletal: Numerous lytic osseous lesions are noted throughout the visualized axial and appendicular structures, highly concerning for widespread metastatic disease to the bones. IMPRESSION: 1. 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 The primary malignancy is not confidently identified on today's examination, however, given the mass-like appearance of the right infrahilar region with the postobstructive changes in the right middle lobe, a primary  bronchogenic carcinoma in the central right middle lobe is suspected. 2. Colonic diverticulosis without evidence of acute diverticulitis at this time. 3. 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 4. Severe prostatomegaly. 5. Aortic atherosclerosis, in addition to left main and 3 vessel coronary artery disease. Assessment for potential risk factor modification, dietary therapy or pharmacologic therapy may be warranted, if clinically indicated. 6. Additional incidental findings, as above. Electronically Signed   By: Vinnie Langton M.D.   On: 03/09/2020 14:45   CT Abdomen Pelvis W Contrast  Result Date: 03/09/2020 CLINICAL DATA:  74 year old male with history of pneumonia. Mid right back pain. Swelling in the right flank. EXAM: CT CHEST, ABDOMEN, AND PELVIS WITH CONTRAST TECHNIQUE: Multidetector CT imaging of the chest, abdomen and pelvis was performed following the standard protocol during bolus administration of intravenous contrast. CONTRAST:  74mL OMNIPAQUE IOHEXOL 300 MG/ML  SOLN COMPARISON:  Chest CT 03/05/2020. CT the abdomen and pelvis 05/01/2004. FINDINGS: CT CHEST FINDINGS Cardiovascular: Heart size is normal. Small amount of pericardial fluid and/or thickening, slightly increased compared to the prior study, but unlikely to be of hemodynamic significance at this time. Atherosclerotic calcifications in the thoracic aorta as well as the left main, left anterior descending, left circumflex and right coronary arteries. Mediastinum/Nodes: Multiple enlarged mediastinal and hilar lymph nodes bilaterally, bulkiest of which is in the low right paratracheal nodal station measuring 2.3 cm in short axis, low left paratracheal nodal station measuring 2.8 cm in short axis, and AP window measuring 1.9 cm in short axis. Hilar lymph nodes measure up to 2 cm in short axis. Esophagus is unremarkable in appearance. Esophagus is unremarkable in appearance. No axillary lymphadenopathy. Lungs/Pleura:  Mass-like enlargement of the right infrahilar region best appreciated on axial image 40 of series 504 estimated to measure approximately 4.6 x 4.3 cm with postobstructive changes in the right middle lobe which appears to reflect a combination of consolidation and atelectasis. Multiple other pulmonary nodules are again noted throughout the lungs bilaterally, largest of which is in the right lower lobe measuring 2.5 x 2.1 cm (axial image 126 of series 505). Musculoskeletal: Enumerable lytic lesions throughout the visualized axial and appendicular skeleton, compatible with widespread metastatic disease to the bones. CT ABDOMEN PELVIS FINDINGS Hepatobiliary: 1.5 x 1.3 cm centrally low-attenuation lesion with some peripheral nodular enhancement and progressive centripetal filling in segment 3 of the liver, compatible with a cavernous hemangioma. No other suspicious hepatic lesions. No intra or extrahepatic biliary ductal dilatation. Gallbladder is normal in appearance. Pancreas: No pancreatic mass. No pancreatic ductal  dilatation. No pancreatic or peripancreatic fluid collections or inflammatory changes. Spleen: Unremarkable. Adrenals/Urinary Tract: Bilateral kidneys and adrenal glands are normal in appearance. No hydroureteronephrosis. Urinary bladder is normal in appearance. Stomach/Bowel: Normal appearance of the stomach. No pathologic dilatation of small bowel or colon. Several colonic diverticulae are noted, without surrounding inflammatory changes to suggest an acute diverticulitis at this time. The appendix is not confidently identified and may be surgically absent. Regardless, there are no inflammatory changes noted adjacent to the cecum to suggest the presence of an acute appendicitis at this time. Vascular/Lymphatic: Aortic atherosclerosis, without evidence of aneurysm or dissection noted in the abdominal or pelvic vasculature. No lymphadenopathy noted in the abdomen or pelvis. Reproductive: Prostate gland is  enlarged and heterogeneous in appearance measuring 5.8 x 4.3 x 6.1 cm. Other: Small volume of ascites.  No pneumoperitoneum. Musculoskeletal: Numerous lytic osseous lesions are noted throughout the visualized axial and appendicular structures, highly concerning for widespread metastatic disease to the bones. IMPRESSION: 1. 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 The primary malignancy is not confidently identified on today's examination, however, given the mass-like appearance of the right infrahilar region with the postobstructive changes in the right middle lobe, a primary bronchogenic carcinoma in the central right middle lobe is suspected. 2. Colonic diverticulosis without evidence of acute diverticulitis at this time. 3. 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 4. Severe prostatomegaly. 5. Aortic atherosclerosis, in addition to left main and 3 vessel coronary artery disease. Assessment for potential risk factor modification, dietary therapy or pharmacologic therapy may be warranted, if clinically indicated. 6. Additional incidental findings, as above. Electronically Signed   By: Vinnie Langton M.D.   On: 03/09/2020 14:45    EKG: Independently reviewed.   Assessment/Plan Active Problems:   Postobstructive pneumonia  Likely post-obstructive pneumonia: did not finish abx course at home as he thought the medications were sent to his "house." Pt was educated on the 2 different ways prescriptions are given at d/c. Pt verbalized his understanding. Continue on IV cefepime, azithromycin. Will start bronchodilators. Legionella, strep, & mycoplasma ordered. Encourage incentive spirometry. Continue on supplemental oxygen and wean as tolerated   Lung mass: s/p biopsy & path  pending on last admission. 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 Continue on supplemental oxygen and wean as tolerated. Will start bronchodilators   Hx of smoking: smoked 5 cigs a day x 30 years but previous documentation stated significantly more ppd. Quit smoking on 03/07/20 as per pt. Nicotine patch to help prevent w/drawal   Leukocytosis: likely secondary to infection. Continue on IV abxs  Hypokalemia: KCl repleated. Will continue to monitor   Elevated alkaline phosphatase: etiology unclear. Will continue to monitor  Transaminitis: etiology unclear. Will continue to monitor  HTN: uncontrolled. Will continue on home dose of amlodipine, metoprolol. Hydralazine prn   DVT prophylaxis: lovenox  Code Status: full  Family Communication:  Called pt's wife but no answer and unable to leave a voicemail  Disposition Plan:  Consults called: none  Admission status: inpatient    Wyvonnia Dusky MD [facility] Pager [phone]  If 7PM-7AM, please contact night-coverage [email or web address]  03/09/2020, 4:34 PM

## 2020-03-10 DIAGNOSIS — J9621 Acute and chronic respiratory failure with hypoxia: Secondary | ICD-10-CM

## 2020-03-10 DIAGNOSIS — R7401 Elevation of levels of liver transaminase levels: Secondary | ICD-10-CM

## 2020-03-10 LAB — COMPREHENSIVE METABOLIC PANEL
ALT: 47 U/L — ABNORMAL HIGH (ref 0–44)
AST: 38 U/L (ref 15–41)
Albumin: 2.1 g/dL — ABNORMAL LOW (ref 3.5–5.0)
Alkaline Phosphatase: 132 U/L — ABNORMAL HIGH (ref 38–126)
Anion gap: 11 (ref 5–15)
BUN: 27 mg/dL — ABNORMAL HIGH (ref 8–23)
CO2: 22 mmol/L (ref 22–32)
Calcium: 10.5 mg/dL — ABNORMAL HIGH (ref 8.9–10.3)
Chloride: 104 mmol/L (ref 98–111)
Creatinine, Ser: 1.11 mg/dL (ref 0.61–1.24)
GFR calc Af Amer: >60 mL/min (ref >60)
GFR calc non Af Amer: >60 mL/min (ref >60)
Glucose, Bld: 86 mg/dL (ref 70–99)
Potassium: 3.9 mmol/L (ref 3.5–5.1)
Sodium: 137 mmol/L (ref 135–145)
Total Bilirubin: 0.8 mg/dL (ref 0.3–1.2)
Total Protein: 6.3 g/dL — ABNORMAL LOW (ref 6.5–8.1)

## 2020-03-10 LAB — CBC
HCT: 36.3 % — ABNORMAL LOW (ref 39.0–52.0)
Hemoglobin: 12.1 g/dL — ABNORMAL LOW (ref 13.0–17.0)
MCH: 32.3 pg (ref 26.0–34.0)
MCHC: 33.3 g/dL (ref 30.0–36.0)
MCV: 96.8 fL (ref 80.0–100.0)
Platelets: 295 10*3/uL (ref 150–400)
RBC: 3.75 MIL/uL — ABNORMAL LOW (ref 4.22–5.81)
RDW: 13.5 % (ref 11.5–15.5)
WBC: 16.5 10*3/uL — ABNORMAL HIGH (ref 4.0–10.5)
nRBC: 0 % (ref 0.0–0.2)

## 2020-03-10 NOTE — Progress Notes (Signed)
PROGRESS NOTE    Troy Shepherd  KXF:[id number] DOB: [birth year] DOA: 03/09/2020 PCP: Theotis Burrow, MD       Assessment & Plan:   Active Problems:   Postobstructive pneumonia   Likely post-obstructive pneumonia: did not finish abx course at home as he thought the medications were sent to his "house." Pt was educated on the 2 different ways prescriptions are given at d/c. Pt verbalized his understanding. Continue on IV cefepime, azithromycin. Will continue bronchodilators. Legionella, strep, & mycoplasma ordered. Encourage incentive spirometry. Continue on supplemental oxygen and wean as tolerated   Lung mass: s/p biopsy & path pending on last admission. 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 Continue on supplemental oxygen and wean as tolerated. Will continue bronchodilators   Hx of smoking: smoked 5 cigs a day x 30 years but previous documentation stated significantly more ppd. Quit smoking on 03/07/20 as per pt. Nicotine patch to help prevent w/drawal   Leukocytosis: likely secondary to infection. Continue on IV abxs  Hypokalemia: WNL today. Will continue to monitor   Elevated alkaline phosphatase: etiology unclear. Trending down today. Will continue to monitor  Transaminitis: etiology unclear, AST is WNL, ALT is still elevated but trending down. Will continue to monitor  HTN: uncontrolled. Will continue on home dose of amlodipine, metoprolol. Hydralazine prn   DVT prophylaxis: lovenox Code Status: full  Family Communication: discussed w/ pt's wife, Antoinette, and answered her questions Disposition Plan: will likely d/c back home but will have PT/OT see the pt prior to d/c    Consultants:    Procedures:    Antimicrobials:    Subjective: Pt c/o shortness of breath   Objective: Vitals:   03/09/20 1911 03/09/20  1930 03/09/20 2005 03/10/20 0637  BP: 101/61  (!) 173/80 (!) 177/73  Pulse: 88 98 98 94  Resp: 18  20 18   Temp: 98.3 F (36.8 C)  97.6 F (36.4 C) 98.8 F (37.1 C)  TempSrc:   Oral Oral  SpO2: 92% 99% 100% 94%  Weight:      Height:        Intake/Output Summary (Last 24 hours) at 03/10/2020 0836 Last data filed at 03/09/2020 2307 Gross per 24 hour  Intake --  Output 680 ml  Net -680 ml   Filed Weights   03/09/20 1204  Weight: 65.8 kg    Examination:  General exam: Appears calm and comfortable  Respiratory system: decreased breath sounds b/l  Cardiovascular system: S1 & S2 +. No , rubs, gallops or clicks.  Gastrointestinal system: Abdomen is nondistended, soft and nontender. Normal bowel sounds heard. Central nervous system: Alert and oriented.  Psychiatry: Judgement and insight appear normal. Flat mood and affect.     Data Reviewed: I have personally reviewed following labs and imaging studies  CBC: Recent Labs  Lab 03/04/20 1219 03/05/20 0546 03/07/20 0846 03/09/20 1230 03/10/20 0416  WBC 11.3* 13.2* 19.4* 17.0* 16.5*  NEUTROABS  --   --  15.7* 13.7*  --   HGB 14.5 13.1 11.9* 13.7 12.1*  HCT 42.0 38.8* 36.2* 39.9 36.3*  MCV 95.9 95.8 97.6 94.8 96.8  PLT 329 315 290 323 696   Basic Metabolic Panel: Recent Labs  Lab 03/04/20 1219 03/05/20 0546 03/06/20 0527 03/09/20 1230 03/10/20 0416  NA 135 133* 137 138 137  K 3.5 3.6 4.1 3.2* 3.9  CL 101 102 103  101 104  CO2 21* 21* 25 25 22   GLUCOSE 96 114* 120* 88 86  BUN 29* 36* 38* 32* 27*  CREATININE 1.43* 1.28* 1.20 1.19 1.11  CALCIUM 10.8* 9.4 10.4* 11.0* 10.5*   GFR: Estimated Creatinine Clearance: 54.3 mL/min (by C-G formula based on SCr of 1.11 mg/dL). Liver Function Tests: Recent Labs  Lab 03/09/20 1230 03/10/20 0416  AST 75* 38  ALT 73* 47*  ALKPHOS 181* 132*  BILITOT 0.9 0.8  PROT 7.2 6.3*  ALBUMIN 2.4* 2.1*   No results for input(s): LIPASE, AMYLASE in the last 168 hours. No results  for input(s): AMMONIA in the last 168 hours. Coagulation Profile: Recent Labs  Lab 03/05/20 1551 03/09/20 1230  INR 1.2 1.4*   Cardiac Enzymes: No results for input(s): CKTOTAL, CKMB, CKMBINDEX, TROPONINI in the last 168 hours. BNP (last 3 results) No results for input(s): PROBNP in the last 8760 hours. HbA1C: No results for input(s): HGBA1C in the last 72 hours. CBG: No results for input(s): GLUCAP in the last 168 hours. Lipid Profile: No results for input(s): CHOL, HDL, LDLCALC, TRIG, CHOLHDL, LDLDIRECT in the last 72 hours. Thyroid Function Tests: No results for input(s): TSH, T4TOTAL, FREET4, T3FREE, THYROIDAB in the last 72 hours. Anemia Panel: No results for input(s): VITAMINB12, FOLATE, FERRITIN, TIBC, IRON, RETICCTPCT in the last 72 hours. Sepsis Labs: Recent Labs  Lab 03/04/20 1517 03/04/20 1742 03/09/20 1220  PROCALCITON 0.49  --   --   LATICACIDVEN 1.3 1.5 1.7    Recent Results (from the past 240 hour(s))  Blood Culture (routine x 2)     Status: None   Collection Time: 03/04/20 12:18 PM   Specimen: BLOOD  Result Value Ref Range Status   Specimen Description BLOOD RARM [facility]  Final   Special Requests   Final    BOTTLES DRAWN AEROBIC AND ANAEROBIC Blood Culture adequate volume   Culture   Final    NO GROWTH 5 DAYS Performed at [facility], [address] [place], [place] [address]    Report Status 03/09/2020 FINAL  Final  Blood Culture (routine x 2)     Status: None   Collection Time: 03/04/20  3:16 PM   Specimen: BLOOD  Result Value Ref Range Status   Specimen Description BLOOD L HAND  Final   Special Requests   Final    BOTTLES DRAWN AEROBIC AND ANAEROBIC Blood Culture adequate volume   Culture   Final    NO GROWTH 5 DAYS Performed at [facility], [address], [place], [place] [address]    Report Status 03/09/2020 FINAL  Final  Respiratory Panel by RT PCR (Flu A&B, Covid) - Nasopharyngeal Swab     Status: None   Collection  Time: 03/04/20  3:17 PM   Specimen: Nasopharyngeal Swab  Result Value Ref Range Status   SARS Coronavirus 2 by RT PCR NEGATIVE NEGATIVE Final    Comment: (NOTE) SARS-CoV-2 target nucleic acids are NOT DETECTED. The SARS-CoV-2 RNA is generally detectable in upper respiratoy specimens during the acute phase of infection. The lowest concentration of SARS-CoV-2 viral copies this assay can detect is 131 copies/mL. A negative result does not preclude SARS-Cov-2 infection and should not be used as the sole basis for treatment or other patient management decisions. A negative result may occur with  improper specimen collection/handling, submission of specimen other than nasopharyngeal swab, presence of viral mutation(s) within the areas targeted by this assay, and inadequate number of viral copies (<131  copies/mL). A negative result must be combined with clinical observations, patient history, and epidemiological information. The expected result is Negative. Fact Sheet for Patients:  PinkCheek.be Fact Sheet for Healthcare Providers:  GravelBags.it This test is not yet ap proved or cleared by the [place] FDA and  has been authorized for detection and/or diagnosis of SARS-CoV-2 by FDA under an Emergency Use Authorization (EUA). This EUA will remain  in effect (meaning this test can be used) for the duration of the COVID-19 declaration under Section 564(b)(1) of the Act, 21 U.S.C. section 360bbb-3(b)(1), unless the authorization is terminated or revoked sooner.    Influenza A by PCR NEGATIVE NEGATIVE Final   Influenza B by PCR NEGATIVE NEGATIVE Final    Comment: (NOTE) The Xpert Xpress SARS-CoV-2/FLU/RSV assay is intended as an aid in  the diagnosis of influenza from Nasopharyngeal swab specimens and  should not be used as a sole basis for treatment. Nasal washings and  aspirates are unacceptable for Xpert Xpress  SARS-CoV-2/FLU/RSV  testing. Fact Sheet for Patients: PinkCheek.be Fact Sheet for Healthcare Providers: GravelBags.it This test is not yet approved or cleared by the [place] FDA and  has been authorized for detection and/or diagnosis of SARS-CoV-2 by  FDA under an Emergency Use Authorization (EUA). This EUA will remain  in effect (meaning this test can be used) for the duration of the  Covid-19 declaration under Section 564(b)(1) of the Act, 21  U.S.C. section 360bbb-3(b)(1), unless the authorization is  terminated or revoked. Performed at [facility], [place], [place] [address]   SARS CORONAVIRUS 2 (TAT 6-24 HRS) Nasopharyngeal Nasopharyngeal Swab     Status: None   Collection Time: 03/05/20  2:35 PM   Specimen: Nasopharyngeal Swab  Result Value Ref Range Status   SARS Coronavirus 2 NEGATIVE NEGATIVE Final    Comment: (NOTE) SARS-CoV-2 target nucleic acids are NOT DETECTED. The SARS-CoV-2 RNA is generally detectable in upper and lower respiratory specimens during the acute phase of infection. Negative results do not preclude SARS-CoV-2 infection, do not rule out co-infections with other pathogens, and should not be used as the sole basis for treatment or other patient management decisions. Negative results must be combined with clinical observations, patient history, and epidemiological information. The expected result is Negative. Fact Sheet for Patients: SugarRoll.be Fact Sheet for Healthcare Providers: https://www.woods-mathews.com/ This test is not yet approved or cleared by the [place] FDA and  has been authorized for detection and/or diagnosis of SARS-CoV-2 by FDA under an Emergency Use Authorization (EUA). This EUA will remain  in effect (meaning this test can be used) for the duration of the COVID-19 declaration under Section 56  4(b)(1) of the Act, 21 U.S.C. section 360bbb-3(b)(1), unless the authorization is terminated or revoked sooner. Performed at [facility], [place] [address], [place], [place] [address]   Culture, blood (Routine x 2)     Status: None (Preliminary result)   Collection Time: 03/09/20 12:31 PM   Specimen: BLOOD  Result Value Ref Range Status   Specimen Description BLOOD BLOOD RIGHT FOREARM  Final   Special Requests   Final    BOTTLES DRAWN AEROBIC AND ANAEROBIC Blood Culture results may not be optimal due to an inadequate volume of blood received in culture bottles   Culture   Final    NO GROWTH < 24 HOURS Performed at [facility], [address] [place], [place] [address]    Report Status PENDING  Incomplete  Culture,  blood (Routine x 2)     Status: None (Preliminary result)   Collection Time: 03/09/20 12:40 PM   Specimen: BLOOD  Result Value Ref Range Status   Specimen Description BLOOD LEFT ANTECUBITAL  Final   Special Requests   Final    BOTTLES DRAWN AEROBIC AND ANAEROBIC Blood Culture results may not be optimal due to an excessive volume of blood received in culture bottles   Culture   Final    NO GROWTH < 24 HOURS Performed at [facility], [address] [place], [place] [address]    Report Status PENDING  Incomplete  Respiratory Panel by RT PCR (Flu A&B, Covid) - Nasopharyngeal Swab     Status: None   Collection Time: 03/09/20  2:32 PM   Specimen: Nasopharyngeal Swab  Result Value Ref Range Status   SARS Coronavirus 2 by RT PCR NEGATIVE NEGATIVE Final    Comment: (NOTE) SARS-CoV-2 target nucleic acids are NOT DETECTED. The SARS-CoV-2 RNA is generally detectable in upper respiratoy specimens during the acute phase of infection. The lowest concentration of SARS-CoV-2 viral copies this assay can detect is 131 copies/mL. A negative result does not preclude SARS-Cov-2 infection and should not be used as the sole basis for treatment or other patient  management decisions. A negative result may occur with  improper specimen collection/handling, submission of specimen other than nasopharyngeal swab, presence of viral mutation(s) within the areas targeted by this assay, and inadequate number of viral copies (<131 copies/mL). A negative result must be combined with clinical observations, patient history, and epidemiological information. The expected result is Negative. Fact Sheet for Patients:  PinkCheek.be Fact Sheet for Healthcare Providers:  GravelBags.it This test is not yet ap proved or cleared by the [place] FDA and  has been authorized for detection and/or diagnosis of SARS-CoV-2 by FDA under an Emergency Use Authorization (EUA). This EUA will remain  in effect (meaning this test can be used) for the duration of the COVID-19 declaration under Section 564(b)(1) of the Act, 21 U.S.C. section 360bbb-3(b)(1), unless the authorization is terminated or revoked sooner.    Influenza A by PCR NEGATIVE NEGATIVE Final   Influenza B by PCR NEGATIVE NEGATIVE Final    Comment: (NOTE) The Xpert Xpress SARS-CoV-2/FLU/RSV assay is intended as an aid in  the diagnosis of influenza from Nasopharyngeal swab specimens and  should not be used as a sole basis for treatment. Nasal washings and  aspirates are unacceptable for Xpert Xpress SARS-CoV-2/FLU/RSV  testing. Fact Sheet for Patients: PinkCheek.be Fact Sheet for Healthcare Providers: GravelBags.it This test is not yet approved or cleared by the [place] FDA and  has been authorized for detection and/or diagnosis of SARS-CoV-2 by  FDA under an Emergency Use Authorization (EUA). This EUA will remain  in effect (meaning this test can be used) for the duration of the  Covid-19 declaration under Section 564(b)(1) of the Act, 21  U.S.C. section 360bbb-3(b)(1), unless the  authorization is  terminated or revoked. Performed at [facility], [address] [place], [place] [address]          Radiology Studies: DG Chest 2 View  Result Date: 03/09/2020 CLINICAL DATA:  Follow-up pneumonia. EXAM: CHEST - 2 VIEW COMPARISON:  03/04/2020 FINDINGS: UPPER limits normal heart size again noted. Bilateral airspace opacities are again noted, with increased RIGHT middle lobe consolidation/atelectasis. A trace RIGHT pleural effusion is noted. No pneumothorax or acute bony abnormality. IMPRESSION: Bilateral airspace opacities again noted with increased RIGHT middle  lobe consolidation/atelectasis. Electronically Signed   By: Margarette Canada M.D.   On: 03/09/2020 13:12   CT Chest W Contrast  Result Date: 03/09/2020 CLINICAL DATA:  74 year old male with history of pneumonia. Mid right back pain. Swelling in the right flank. EXAM: CT CHEST, ABDOMEN, AND PELVIS WITH CONTRAST TECHNIQUE: Multidetector CT imaging of the chest, abdomen and pelvis was performed following the standard protocol during bolus administration of intravenous contrast. CONTRAST:  7mL OMNIPAQUE IOHEXOL 300 MG/ML  SOLN COMPARISON:  Chest CT 03/05/2020. CT the abdomen and pelvis 05/01/2004. FINDINGS: CT CHEST FINDINGS Cardiovascular: Heart size is normal. Small amount of pericardial fluid and/or thickening, slightly increased compared to the prior study, but unlikely to be of hemodynamic significance at this time. Atherosclerotic calcifications in the thoracic aorta as well as the left main, left anterior descending, left circumflex and right coronary arteries. Mediastinum/Nodes: Multiple enlarged mediastinal and hilar lymph nodes bilaterally, bulkiest of which is in the low right paratracheal nodal station measuring 2.3 cm in short axis, low left paratracheal nodal station measuring 2.8 cm in short axis, and AP window measuring 1.9 cm in short axis. Hilar lymph nodes measure up to 2 cm in short axis. Esophagus is  unremarkable in appearance. Esophagus is unremarkable in appearance. No axillary lymphadenopathy. Lungs/Pleura: Mass-like enlargement of the right infrahilar region best appreciated on axial image 40 of series 504 estimated to measure approximately 4.6 x 4.3 cm with postobstructive changes in the right middle lobe which appears to reflect a combination of consolidation and atelectasis. Multiple other pulmonary nodules are again noted throughout the lungs bilaterally, largest of which is in the right lower lobe measuring 2.5 x 2.1 cm (axial image 126 of series 505). Musculoskeletal: Enumerable lytic lesions throughout the visualized axial and appendicular skeleton, compatible with widespread metastatic disease to the bones. CT ABDOMEN PELVIS FINDINGS Hepatobiliary: 1.5 x 1.3 cm centrally low-attenuation lesion with some peripheral nodular enhancement and progressive centripetal filling in segment 3 of the liver, compatible with a cavernous hemangioma. No other suspicious hepatic lesions. No intra or extrahepatic biliary ductal dilatation. Gallbladder is normal in appearance. Pancreas: No pancreatic mass. No pancreatic ductal dilatation. No pancreatic or peripancreatic fluid collections or inflammatory changes. Spleen: Unremarkable. Adrenals/Urinary Tract: Bilateral kidneys and adrenal glands are normal in appearance. No hydroureteronephrosis. Urinary bladder is normal in appearance. Stomach/Bowel: Normal appearance of the stomach. No pathologic dilatation of small bowel or colon. Several colonic diverticulae are noted, without surrounding inflammatory changes to suggest an acute diverticulitis at this time. The appendix is not confidently identified and may be surgically absent. Regardless, there are no inflammatory changes noted adjacent to the cecum to suggest the presence of an acute appendicitis at this time. Vascular/Lymphatic: Aortic atherosclerosis, without evidence of aneurysm or dissection noted in the  abdominal or pelvic vasculature. No lymphadenopathy noted in the abdomen or pelvis. Reproductive: Prostate gland is enlarged and heterogeneous in appearance measuring 5.8 x 4.3 x 6.1 cm. Other: Small volume of ascites.  No pneumoperitoneum. Musculoskeletal: Numerous lytic osseous lesions are noted throughout the visualized axial and appendicular structures, highly concerning for widespread metastatic disease to the bones. IMPRESSION: 1. 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 The primary malignancy is not confidently identified on today's examination, however, given the mass-like appearance of the right infrahilar region with the postobstructive changes in the right middle lobe, a primary bronchogenic carcinoma  in the central right middle lobe is suspected. 2. Colonic diverticulosis without evidence of acute diverticulitis at this time. 3. 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 4. Severe prostatomegaly. 5. Aortic atherosclerosis, in addition to left main and 3 vessel coronary artery disease. Assessment for potential risk factor modification, dietary therapy or pharmacologic therapy may be warranted, if clinically indicated. 6. Additional incidental findings, as above. Electronically Signed   By: Vinnie Langton M.D.   On: 03/09/2020 14:45   CT Abdomen Pelvis W Contrast  Result Date: 03/09/2020 CLINICAL DATA:  74 year old male with history of pneumonia. Mid right back pain. Swelling in the right flank. EXAM: CT CHEST, ABDOMEN, AND PELVIS WITH CONTRAST TECHNIQUE: Multidetector CT imaging of the chest, abdomen and pelvis was performed following the standard protocol during bolus administration of intravenous contrast. CONTRAST:  91mL OMNIPAQUE IOHEXOL 300 MG/ML  SOLN COMPARISON:   Chest CT 03/05/2020. CT the abdomen and pelvis 05/01/2004. FINDINGS: CT CHEST FINDINGS Cardiovascular: Heart size is normal. Small amount of pericardial fluid and/or thickening, slightly increased compared to the prior study, but unlikely to be of hemodynamic significance at this time. Atherosclerotic calcifications in the thoracic aorta as well as the left main, left anterior descending, left circumflex and right coronary arteries. Mediastinum/Nodes: Multiple enlarged mediastinal and hilar lymph nodes bilaterally, bulkiest of which is in the low right paratracheal nodal station measuring 2.3 cm in short axis, low left paratracheal nodal station measuring 2.8 cm in short axis, and AP window measuring 1.9 cm in short axis. Hilar lymph nodes measure up to 2 cm in short axis. Esophagus is unremarkable in appearance. Esophagus is unremarkable in appearance. No axillary lymphadenopathy. Lungs/Pleura: Mass-like enlargement of the right infrahilar region best appreciated on axial image 40 of series 504 estimated to measure approximately 4.6 x 4.3 cm with postobstructive changes in the right middle lobe which appears to reflect a combination of consolidation and atelectasis. Multiple other pulmonary nodules are again noted throughout the lungs bilaterally, largest of which is in the right lower lobe measuring 2.5 x 2.1 cm (axial image 126 of series 505). Musculoskeletal: Enumerable lytic lesions throughout the visualized axial and appendicular skeleton, compatible with widespread metastatic disease to the bones. CT ABDOMEN PELVIS FINDINGS Hepatobiliary: 1.5 x 1.3 cm centrally low-attenuation lesion with some peripheral nodular enhancement and progressive centripetal filling in segment 3 of the liver, compatible with a cavernous hemangioma. No other suspicious hepatic lesions. No intra or extrahepatic biliary ductal dilatation. Gallbladder is normal in appearance. Pancreas: No pancreatic mass. No pancreatic ductal  dilatation. No pancreatic or peripancreatic fluid collections or inflammatory changes. Spleen: Unremarkable. Adrenals/Urinary Tract: Bilateral kidneys and adrenal glands are normal in appearance. No hydroureteronephrosis. Urinary bladder is normal in appearance. Stomach/Bowel: Normal appearance of the stomach. No pathologic dilatation of small bowel or colon. Several colonic diverticulae are noted, without surrounding inflammatory changes to suggest an acute diverticulitis at this time. The appendix is not confidently identified and may be surgically absent. Regardless, there are no inflammatory changes noted adjacent to the cecum to suggest the presence of an acute appendicitis at this time. Vascular/Lymphatic: Aortic atherosclerosis, without evidence of aneurysm or dissection noted in the abdominal or pelvic vasculature. No lymphadenopathy noted in the abdomen or pelvis. Reproductive: Prostate gland is enlarged and heterogeneous in appearance measuring 5.8 x 4.3 x 6.1 cm. Other: Small volume of ascites.  No pneumoperitoneum. Musculoskeletal: Numerous lytic osseous lesions are noted throughout the visualized axial and appendicular structures, highly concerning for widespread metastatic  disease to the bones. IMPRESSION: 1. 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 The primary malignancy is not confidently identified on today's examination, however, given the mass-like appearance of the right infrahilar region with the postobstructive changes in the right middle lobe, a primary bronchogenic carcinoma in the central right middle lobe is suspected. 2. Colonic diverticulosis without evidence of acute diverticulitis at this time. 3. 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 4. Severe  prostatomegaly. 5. Aortic atherosclerosis, in addition to left main and 3 vessel coronary artery disease. Assessment for potential risk factor modification, dietary therapy or pharmacologic therapy may be warranted, if clinically indicated. 6. Additional incidental findings, as above. Electronically Signed   By: Vinnie Langton M.D.   On: 03/09/2020 14:45        Scheduled Meds: . amLODipine  10 mg Oral Daily  . benzonatate  200 mg Oral TID  . enoxaparin (LOVENOX) injection  40 mg Subcutaneous Q24H  . influenza vaccine adjuvanted  0.5 mL Intramuscular Tomorrow-1000  . metoprolol tartrate  25 mg Oral BID  . nicotine  7 mg Transdermal Q24H  . pneumococcal 23 valent vaccine  0.5 mL Intramuscular Tomorrow-1000  . sodium chloride flush  3 mL Intravenous Q12H   Continuous Infusions: . sodium chloride 250 mL (03/09/20 2255)  . azithromycin    . ceFEPime (MAXIPIME) IV 2 g (03/09/20 2259)     LOS: 1 day    Time spent: 33 mins     Wyvonnia Dusky, MD [facility] Pager [phone]  If 7PM-7AM, please contact night-coverage [email or web address] 03/10/2020, 8:36 AM

## 2020-03-11 ENCOUNTER — Inpatient Hospital Stay: Payer: Medicare HMO

## 2020-03-11 DIAGNOSIS — I4891 Unspecified atrial fibrillation: Secondary | ICD-10-CM

## 2020-03-11 DIAGNOSIS — C342 Malignant neoplasm of middle lobe, bronchus or lung: Secondary | ICD-10-CM

## 2020-03-11 DIAGNOSIS — J189 Pneumonia, unspecified organism: Secondary | ICD-10-CM

## 2020-03-11 LAB — CBC
HCT: 35.6 % — ABNORMAL LOW (ref 39.0–52.0)
Hemoglobin: 11.7 g/dL — ABNORMAL LOW (ref 13.0–17.0)
MCH: 32.2 pg (ref 26.0–34.0)
MCHC: 32.9 g/dL (ref 30.0–36.0)
MCV: 98.1 fL (ref 80.0–100.0)
Platelets: 300 10*3/uL (ref 150–400)
RBC: 3.63 MIL/uL — ABNORMAL LOW (ref 4.22–5.81)
RDW: 13.6 % (ref 11.5–15.5)
WBC: 14.8 10*3/uL — ABNORMAL HIGH (ref 4.0–10.5)
nRBC: 0 % (ref 0.0–0.2)

## 2020-03-11 LAB — BLOOD GAS, ARTERIAL
Acid-base deficit: 9.6 mmol/L — ABNORMAL HIGH (ref 0.0–2.0)
Bicarbonate: 14.8 mmol/L — ABNORMAL LOW (ref 20.0–28.0)
FIO2: 0.45
O2 Saturation: 90.7 %
Patient temperature: 37
pCO2 arterial: 28 mmHg — ABNORMAL LOW (ref 32.0–48.0)
pH, Arterial: 7.33 — ABNORMAL LOW (ref 7.350–7.450)
pO2, Arterial: 65 mmHg — ABNORMAL LOW (ref 83.0–108.0)

## 2020-03-11 LAB — COMPREHENSIVE METABOLIC PANEL
ALT: 33 U/L (ref 0–44)
AST: 31 U/L (ref 15–41)
Albumin: 2.2 g/dL — ABNORMAL LOW (ref 3.5–5.0)
Alkaline Phosphatase: 126 U/L (ref 38–126)
Anion gap: 10 (ref 5–15)
BUN: 31 mg/dL — ABNORMAL HIGH (ref 8–23)
CO2: 22 mmol/L (ref 22–32)
Calcium: 11.8 mg/dL — ABNORMAL HIGH (ref 8.9–10.3)
Chloride: 104 mmol/L (ref 98–111)
Creatinine, Ser: 1.25 mg/dL — ABNORMAL HIGH (ref 0.61–1.24)
GFR calc Af Amer: >60 mL/min (ref >60)
GFR calc non Af Amer: 56 mL/min — ABNORMAL LOW (ref >60)
Glucose, Bld: 70 mg/dL (ref 70–99)
Potassium: 4.4 mmol/L (ref 3.5–5.1)
Sodium: 136 mmol/L (ref 135–145)
Total Bilirubin: 1.3 mg/dL — ABNORMAL HIGH (ref 0.3–1.2)
Total Protein: 6.3 g/dL — ABNORMAL LOW (ref 6.5–8.1)

## 2020-03-11 LAB — MAGNESIUM: Magnesium: 2 mg/dL (ref 1.7–2.4)

## 2020-03-11 LAB — LACTIC ACID, PLASMA
Lactic Acid, Venous: 3 mmol/L (ref 0.5–1.9)
Lactic Acid, Venous: 3.6 mmol/L (ref 0.5–1.9)
Lactic Acid, Venous: 3 mmol/L (ref 0.5–1.9)

## 2020-03-11 LAB — GLUCOSE, CAPILLARY
Glucose-Capillary: 73 mg/dL (ref 70–99)
Glucose-Capillary: 74 mg/dL (ref 70–99)

## 2020-03-11 MED ORDER — SODIUM BICARBONATE-DEXTROSE 150-5 MEQ/L-% IV SOLN
150.0000 meq | INTRAVENOUS | Status: DC
  Administered 2020-03-11 – 2020-03-12 (×3): 150 meq via INTRAVENOUS
  Filled 2020-03-11 (×3): qty 1000

## 2020-03-11 MED ORDER — PIPERACILLIN-TAZOBACTAM 3.375 G IVPB
3.3750 g | Freq: Three times a day (TID) | INTRAVENOUS | Status: DC
  Administered 2020-03-11 – 2020-03-13 (×6): 3.375 g via INTRAVENOUS
  Filled 2020-03-11 (×8): qty 50

## 2020-03-11 MED ORDER — DILTIAZEM HCL-DEXTROSE 125-5 MG/125ML-% IV SOLN (PREMIX)
5.0000 mg/h | INTRAVENOUS | Status: DC
  Administered 2020-03-11: 5 mg/h via INTRAVENOUS
  Filled 2020-03-11: qty 125

## 2020-03-11 MED ORDER — MORPHINE SULFATE (PF) 2 MG/ML IV SOLN
2.0000 mg | INTRAVENOUS | Status: DC | PRN
  Administered 2020-03-11 – 2020-03-12 (×5): 2 mg via INTRAVENOUS
  Filled 2020-03-11 (×4): qty 1

## 2020-03-11 MED ORDER — AMIODARONE HCL IN DEXTROSE 360-4.14 MG/200ML-% IV SOLN
INTRAVENOUS | Status: AC
  Administered 2020-03-11: 150 mg via INTRAVENOUS
  Filled 2020-03-11: qty 200

## 2020-03-11 MED ORDER — OXYCODONE HCL 5 MG PO TABS
10.0000 mg | ORAL_TABLET | Freq: Four times a day (QID) | ORAL | Status: DC | PRN
  Administered 2020-03-12: 10 mg via ORAL
  Filled 2020-03-11: qty 2

## 2020-03-11 MED ORDER — SODIUM BICARBONATE 8.4 % IV SOLN: INTRAVENOUS | Status: DC

## 2020-03-11 MED ORDER — AMIODARONE HCL IN DEXTROSE 360-4.14 MG/200ML-% IV SOLN
60.0000 mg/h | INTRAVENOUS | Status: AC
  Administered 2020-03-11: 60 mg/h via INTRAVENOUS
  Filled 2020-03-11: qty 200

## 2020-03-11 MED ORDER — CHLORHEXIDINE GLUCONATE CLOTH 2 % EX PADS
6.0000 | MEDICATED_PAD | Freq: Every day | CUTANEOUS | Status: DC
  Administered 2020-03-11 – 2020-03-13 (×3): 6 via TOPICAL

## 2020-03-11 MED ORDER — AMIODARONE HCL IN DEXTROSE 360-4.14 MG/200ML-% IV SOLN
30.0000 mg/h | INTRAVENOUS | Status: DC
  Administered 2020-03-11 – 2020-03-12 (×3): 30 mg/h via INTRAVENOUS
  Filled 2020-03-11 (×2): qty 200

## 2020-03-11 MED ORDER — METOPROLOL TARTRATE 5 MG/5ML IV SOLN
5.0000 mg | Freq: Three times a day (TID) | INTRAVENOUS | Status: DC | PRN
  Administered 2020-03-11: 5 mg via INTRAVENOUS
  Filled 2020-03-11: qty 5

## 2020-03-11 MED ORDER — SODIUM CHLORIDE 0.9 % IV BOLUS
500.0000 mL | Freq: Once | INTRAVENOUS | Status: AC
  Administered 2020-03-11: 500 mL via INTRAVENOUS

## 2020-03-11 MED ORDER — ACETAMINOPHEN 325 MG PO TABS
650.0000 mg | ORAL_TABLET | Freq: Four times a day (QID) | ORAL | Status: DC | PRN
  Administered 2020-03-11: 650 mg via ORAL
  Filled 2020-03-11: qty 2

## 2020-03-11 MED ORDER — METHYLPREDNISOLONE SODIUM SUCC 40 MG IJ SOLR
40.0000 mg | Freq: Two times a day (BID) | INTRAMUSCULAR | Status: DC
  Administered 2020-03-11: 40 mg via INTRAVENOUS
  Filled 2020-03-11: qty 1

## 2020-03-11 MED ORDER — METHYLPREDNISOLONE SODIUM SUCC 125 MG IJ SOLR
80.0000 mg | Freq: Two times a day (BID) | INTRAMUSCULAR | Status: DC
  Administered 2020-03-11 – 2020-03-12 (×2): 80 mg via INTRAVENOUS
  Filled 2020-03-11 (×2): qty 2

## 2020-03-11 MED ORDER — IPRATROPIUM-ALBUTEROL 0.5-2.5 (3) MG/3ML IN SOLN
3.0000 mL | RESPIRATORY_TRACT | Status: DC
  Administered 2020-03-11 – 2020-03-14 (×18): 3 mL via RESPIRATORY_TRACT
  Filled 2020-03-11 (×18): qty 3

## 2020-03-11 MED ORDER — AMIODARONE LOAD VIA INFUSION
150.0000 mg | Freq: Once | INTRAVENOUS | Status: AC
  Filled 2020-03-11: qty 83.34

## 2020-03-11 NOTE — Consult Note (Addendum)
Reason for Consult: Acute on chronic respiratory failure/A. fib with RVR Referring Physician: [name], MD  Troy Shepherd is an 74 y.o. male.  HPI: This is a 74 year old recent former smoker (4 days ago) who has been admitted twice to [facility] since 04 March 2020.  This is a second admission.  He presented on 12 March with increasing shortness of breath and back pain.  His prior admission on 10 March he had a biopsy of a supraclavicular node pathology of which is still pending.  He has been noted to have numerous pulmonary nodules in the lungs bilaterally with bilateral hilar and mediastinal lymphadenopathy which is quite bulky and innumerable lytic lesions in the skeletal structures.  This is indicative of malignancy with widespread metastasis, in addition he has severe emphysema noted.  Patient was being treated for exacerbation of COPD and for management of his back pain which is likely related to his innumerable metastatic deposits.  He became more acutely short of breath and was noted to have atrial fibrillation rapid ventricular response and was transferred to the stepdown unit.  We are asked to assist with his management.  On my initial evaluation of the patient the patient was in extreme respiratory distress and was in atrial fibrillation with RVR rate as high as 160s.  He had not responded to Cardizem infusion that was previously ordered.  His blood pressure was also noted to be declining on the Cardizem infusion.  The patient was placed on high flow O2 however because of increasing work of breathing BiPAP was attempted however the patient tolerated poorly and actually begged to have it removed.  A quick point-of-care ultrasound examination of the heart did not show significant pericardial effusion.  The patient was provided morphine sulfate for control of dyspnea and anxiolysis.  Patient's wife presented at the bedside and they are in agreement to continue conservative measures but if the patient  deteriorates further transition him to comfort care.  The patient is currently DNR/DNI.    Past Medical History:  Diagnosis Date  . Alcohol abuse    quit 09/2014  . Arthritis   . Hypertension   . Leukopenia    resolved when stopped drinking 09/2014  . Pneumonia     Past Surgical History:  Procedure Laterality Date  . APPENDECTOMY    . COLONOSCOPY N/A 11/09/2015   Procedure: COLONOSCOPY;  Surgeon: Josefine Class, MD;  Location: [facility] ENDOSCOPY;  Service: Endoscopy;  Laterality: N/A;    Family History  Problem Relation Age of Onset  . COPD Sister     Social History:  reports that he quit smoking 4 days ago. His smoking use included cigarettes. He has a 4.00 pack-year smoking history. He has never used smokeless tobacco. He reports current alcohol use of about 3.0 standard drinks of alcohol per week. He reports that he does not use drugs.  Allergies: No Known Allergies  Medications: I have reviewed the patient's current medications.  Results for orders placed or performed during the hospital encounter of 03/09/20 (from the past 48 hour(s))  Respiratory Panel by RT PCR (Flu A&B, Covid) - Nasopharyngeal Swab     Status: None   Collection Time: 03/09/20  2:32 PM   Specimen: Nasopharyngeal Swab  Result Value Ref Range   SARS Coronavirus 2 by RT PCR NEGATIVE NEGATIVE    Comment: (NOTE) SARS-CoV-2 target nucleic acids are NOT DETECTED. The SARS-CoV-2 RNA is generally detectable in upper respiratoy specimens during the acute phase of infection. The lowest  concentration of SARS-CoV-2 viral copies this assay can detect is 131 copies/mL. A negative result does not preclude SARS-Cov-2 infection and should not be used as the sole basis for treatment or other patient management decisions. A negative result may occur with  improper specimen collection/handling, submission of specimen other than nasopharyngeal swab, presence of viral mutation(s) within the areas targeted by this  assay, and inadequate number of viral copies (<131 copies/mL). A negative result must be combined with clinical observations, patient history, and epidemiological information. The expected result is Negative. Fact Sheet for Patients:  PinkCheek.be Fact Sheet for Healthcare Providers:  GravelBags.it This test is not yet ap proved or cleared by the [place] FDA and  has been authorized for detection and/or diagnosis of SARS-CoV-2 by FDA under an Emergency Use Authorization (EUA). This EUA will remain  in effect (meaning this test can be used) for the duration of the COVID-19 declaration under Section 564(b)(1) of the Act, 21 U.S.C. section 360bbb-3(b)(1), unless the authorization is terminated or revoked sooner.    Influenza A by PCR NEGATIVE NEGATIVE   Influenza B by PCR NEGATIVE NEGATIVE    Comment: (NOTE) The Xpert Xpress SARS-CoV-2/FLU/RSV assay is intended as an aid in  the diagnosis of influenza from Nasopharyngeal swab specimens and  should not be used as a sole basis for treatment. Nasal washings and  aspirates are unacceptable for Xpert Xpress SARS-CoV-2/FLU/RSV  testing. Fact Sheet for Patients: PinkCheek.be Fact Sheet for Healthcare Providers: GravelBags.it This test is not yet approved or cleared by the [place] FDA and  has been authorized for detection and/or diagnosis of SARS-CoV-2 by  FDA under an Emergency Use Authorization (EUA). This EUA will remain  in effect (meaning this test can be used) for the duration of the  Covid-19 declaration under Section 564(b)(1) of the Act, 21  U.S.C. section 360bbb-3(b)(1), unless the authorization is  terminated or revoked. Performed at [facility], [address] [place], [place] [address]   CBC     Status: Abnormal   Collection Time: 03/10/20  4:16 AM  Result Value Ref Range   WBC 16.5 (H)  4.0 - 10.5 K/uL   RBC 3.75 (L) 4.22 - 5.81 MIL/uL   Hemoglobin 12.1 (L) 13.0 - 17.0 g/dL   HCT 36.3 (L) 39.0 - 52.0 %   MCV 96.8 80.0 - 100.0 fL   MCH 32.3 26.0 - 34.0 pg   MCHC 33.3 30.0 - 36.0 g/dL   RDW 13.5 11.5 - 15.5 %   Platelets 295 150 - 400 K/uL   nRBC 0.0 0.0 - 0.2 %    Comment: Performed at [facility], [address], [place], [place] [address]  Comprehensive metabolic panel     Status: Abnormal   Collection Time: 03/10/20  4:16 AM  Result Value Ref Range   Sodium 137 135 - 145 mmol/L   Potassium 3.9 3.5 - 5.1 mmol/L   Chloride 104 98 - 111 mmol/L   CO2 22 22 - 32 mmol/L   Glucose, Bld 86 70 - 99 mg/dL    Comment: Glucose reference range applies only to samples taken after fasting for at least 8 hours.   BUN 27 (H) 8 - 23 mg/dL   Creatinine, Ser 1.11 0.61 - 1.24 mg/dL   Calcium 10.5 (H) 8.9 - 10.3 mg/dL   Total Protein 6.3 (L) 6.5 - 8.1 g/dL   Albumin 2.1 (L) 3.5 - 5.0 g/dL   AST 38 15 - 41 U/L  ALT 47 (H) 0 - 44 U/L   Alkaline Phosphatase 132 (H) 38 - 126 U/L   Total Bilirubin 0.8 0.3 - 1.2 mg/dL   GFR calc non Af Amer >60 >60 mL/min   GFR calc Af Amer >60 >60 mL/min   Anion gap 11 5 - 15    Comment: Performed at [facility], [address] [place], [place] [address]  CBC     Status: Abnormal   Collection Time: 03/11/20  7:47 AM  Result Value Ref Range   WBC 14.8 (H) 4.0 - 10.5 K/uL   RBC 3.63 (L) 4.22 - 5.81 MIL/uL   Hemoglobin 11.7 (L) 13.0 - 17.0 g/dL   HCT 35.6 (L) 39.0 - 52.0 %   MCV 98.1 80.0 - 100.0 fL   MCH 32.2 26.0 - 34.0 pg   MCHC 32.9 30.0 - 36.0 g/dL   RDW 13.6 11.5 - 15.5 %   Platelets 300 150 - 400 K/uL   nRBC 0.0 0.0 - 0.2 %    Comment: Performed at [facility], [address], [place], [place] [address]  Comprehensive metabolic panel     Status: Abnormal   Collection Time: 03/11/20  7:47 AM  Result Value Ref Range   Sodium 136 135 - 145 mmol/L   Potassium 4.4 3.5 - 5.1 mmol/L   Chloride 104 98 - 111  mmol/L   CO2 22 22 - 32 mmol/L   Glucose, Bld 70 70 - 99 mg/dL    Comment: Glucose reference range applies only to samples taken after fasting for at least 8 hours.   BUN 31 (H) 8 - 23 mg/dL   Creatinine, Ser 1.25 (H) 0.61 - 1.24 mg/dL   Calcium 11.8 (H) 8.9 - 10.3 mg/dL   Total Protein 6.3 (L) 6.5 - 8.1 g/dL   Albumin 2.2 (L) 3.5 - 5.0 g/dL   AST 31 15 - 41 U/L   ALT 33 0 - 44 U/L   Alkaline Phosphatase 126 38 - 126 U/L   Total Bilirubin 1.3 (H) 0.3 - 1.2 mg/dL   GFR calc non Af Amer 56 (L) >60 mL/min   GFR calc Af Amer >60 >60 mL/min   Anion gap 10 5 - 15    Comment: Performed at [facility], [address] [place], [place] [address]  Glucose, capillary     Status: None   Collection Time: 03/11/20  1:16 PM  Result Value Ref Range   Glucose-Capillary 73 70 - 99 mg/dL    Comment: Glucose reference range applies only to samples taken after fasting for at least 8 hours.  Glucose, capillary     Status: None   Collection Time: 03/11/20  3:20 PM  Result Value Ref Range   Glucose-Capillary 74 70 - 99 mg/dL    Comment: Glucose reference range applies only to samples taken after fasting for at least 8 hours.    DG Chest Port 1 View  Result Date: 03/11/2020 CLINICAL DATA:  Dyspnea, former smoker, history of pneumonia EXAM: PORTABLE CHEST 1 VIEW COMPARISON:  03/09/2020 chest radiograph. FINDINGS: Stable cardiomediastinal silhouette with normal heart size. No pneumothorax. Stable mild blunting of the right costophrenic angle suggesting small pleural effusion. No left pleural effusion. Worsening masslike consolidation in right infrahilar and right lower lung. Worsening hazy opacities in the left lung. Nodular opacity again noted in the peripheral right lower lobe. IMPRESSION: 1. Worsening masslike consolidation in the right infrahilar and right lower lung, likely a combination of pulmonary  neoplasm and worsening postobstructive pneumonia as described on recent chest CT from 03/09/2020.  2. Nodular opacity in the peripheral right lower lung compatible with known pulmonary nodule on recent chest CT. 3. Worsening hazy opacities in the left lung, also suspicious for pneumonia versus lymphangitic tumor. Electronically Signed   By: Ilona Sorrel M.D.   On: 03/11/2020 15:23    Review of Systems  Unable to perform ROS: Acuity of condition   Blood pressure (!) 89/57, pulse (!) 107, temperature 97.6 F (36.4 C), temperature source Oral, resp. rate (!) 27, height 5\' 7"  (1.702 m), weight 65.8 kg, SpO2 100 %. Physical Exam GENERAL: Thin elderly male in severe respiratory distress, sitting in tripod position at the bedside obviously uncomfortable. HEAD: Normocephalic, atraumatic.  EYES: Pupils equal, round, reactive to light.  No scleral icterus.  MOUTH: Oral mucosa dry, no thrush. NECK: Supple. No thyromegaly. No nodules. No JVD.  Trachea midline, no crepitus. PULMONARY: Rhonchi throughout.  Poor air entry at the right base.  No wheezes. CARDIOVASCULAR: S1 and S2.  Irregular rate, initial rate in the 140s now decreasing to the low 100s. GASTROINTESTINAL: Protuberant abdomen, soft, nondistended.  Normoactive bowel sounds. MUSCULOSKELETAL: No joint deformity, no clubbing, no edema.  NEUROLOGIC: Agitated due to shortness of breath.  Oriented to person, time, place.  No overt focal deficit SKIN: Intact,warm,dry PSYCH: Anxious  CT scans independently reviewed.  Representative slices as below:       Assessment/Plan:  Acute respiratory failure with hypoxia Extensive involvement of carcinoma Postobstructive pneumonia, query lymphangitic spread Oxygen supplementation to maintain oxygen saturations between 88 to 92% End-of-life discussions with the patient and wife: DNR/DNI Morphine for palliation of dyspnea/pain Bronchodilators as needed Await pathology results  COPD with acute exacerbation Supplemental oxygen as above Did not tolerate BiPAP Corticosteroids Continue antibiotic  coverage for potential postobstructive pneumonia  Atrial fibrillation with rapid ventricular response Check electrolytes and replete as needed to a magnesium Not tolerating Cardizem, discontinue Amiodarone load and infusion Not a candidate for further restratification due to underlying cancer and metastatic disease Point-of-care ultrasound did not show significant pericardial effusion Obtain formal 2D echo for completeness   As noted above I had the patient's wife present to the bedside.  We discussed end-of-life issues.  His diseases too widely spread and his functional reserve is very poor.  I recommend palliative care.  Palliative care consultation will be placed.  He may benefit from consideration of hospice care if his symptoms can be palliated in-house.  Patient and wife agree with DNR/DNI status.       Renold Don, MD [facility] 03/11/2020, 3:27 PM    *This note was dictated using voice recognition software/Dragon.  Despite best efforts to proofread, errors can occur which can change the meaning.  Any change was purely unintentional.

## 2020-03-11 NOTE — Progress Notes (Signed)
Pt was transfer to ICU 9.

## 2020-03-11 NOTE — Progress Notes (Addendum)
PROGRESS NOTE    Troy Shepherd  INO:[id number] DOB: 1946-06-18 DOA: 03/09/2020 PCP: Theotis Burrow, MD       Assessment & Plan:   Active Problems:   Postobstructive pneumonia   Likely post-obstructive pneumonia: did not finish abx course at home as he thought the medications were sent to his "house." Pt was educated on the 2 different ways prescriptions are given at d/c. Pt verbalized his understanding. Continue on IV cefepime, azithromycin. Will start IV steroids. Will continue bronchodilators. Legionella, strep, & mycoplasma pending. Encourage incentive spirometry. Continue on supplemental oxygen and wean as tolerated   Lung mass: s/p biopsy & path pending on last admission. 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 Increased oxygen demand today, currently 6L [place] as per pt's nurse. Will likely need HFNC. Continue on supplemental oxygen and wean as tolerated. Will continue bronchodilators. Will start IV steroids   Hx of smoking: smoked 5 cigs a day x 30 years but previous documentation stated significantly more ppd. Quit smoking on 03/07/20 as per pt. Nicotine patch to help prevent w/drawal   Leukocytosis: likely secondary to infection. Continue on IV abxs  Hypokalemia: WNL today. Will continue to monitor   Elevated alkaline phosphatase: resolved  Transaminitis: resolved  HTN: uncontrolled. Will continue on home dose of amlodipine, metoprolol. Hydralazine prn  Generalized weakness: PT recs home health   DVT prophylaxis: lovenox Code Status: full  Family Communication: discussed w/ pt's wife, Antoinette, and answered her questions Disposition Plan: PT recs home health but pt is not stable for d/c home secondary to worsening respiratory failure    Consultants:    Procedures:    Antimicrobials: cefepime,  azithromycin   Subjective: Pt c/o shortness of breath   Objective: Vitals:   03/10/20 2115 03/10/20 2357 03/11/20 0457 03/11/20 0659  BP: (!) 149/71  139/62   Pulse: (!) 104 90 98   Resp: 18  16   Temp: 99.2 F (37.3 C)  99.2 F (37.3 C) 98.9 F (37.2 C)  TempSrc: Oral  Oral Oral  SpO2: 96% 94% 90%   Weight:      Height:        Intake/Output Summary (Last 24 hours) at 03/11/2020 0820 Last data filed at 03/11/2020 0451 Gross per 24 hour  Intake 859.71 ml  Output 950 ml  Net -90.29 ml   Filed Weights   03/09/20 1204  Weight: 65.8 kg    Examination:  General exam: Appears calm and comfortable  Respiratory system: diminished breath sounds b/l. Scattered wheezes  Cardiovascular system: S1 & S2 +. No , rubs, gallops or clicks.  Gastrointestinal system: Abdomen is nondistended, soft and nontender. Normal bowel sounds heard. Central nervous system: Alert and oriented. Moves all 4 extremities  Psychiatry: Judgement and insight appear normal. Flat mood and affect.     Data Reviewed: I have personally reviewed following labs and imaging studies  CBC: Recent Labs  Lab 03/05/20 0546 03/07/20 0846 03/09/20 1230 03/10/20 0416 03/11/20 0747  WBC 13.2* 19.4* 17.0* 16.5* 14.8*  NEUTROABS  --  15.7* 13.7*  --   --   HGB 13.1 11.9* 13.7 12.1* 11.7*  HCT 38.8* 36.2* 39.9 36.3* 35.6*  MCV 95.8 97.6 94.8 96.8 98.1  PLT 315 290 323 295 096   Basic Metabolic Panel: Recent Labs  Lab 03/04/20 1219 03/05/20 0546 03/06/20 0527 03/09/20 1230 03/10/20 0416  NA  135 133* 137 138 137  K 3.5 3.6 4.1 3.2* 3.9  CL 101 102 103 101 104  CO2 21* 21* 25 25 22   GLUCOSE 96 114* 120* 88 86  BUN 29* 36* 38* 32* 27*  CREATININE 1.43* 1.28* 1.20 1.19 1.11  CALCIUM 10.8* 9.4 10.4* 11.0* 10.5*   GFR: Estimated Creatinine Clearance: 54.3 mL/min (by C-G formula based on SCr of 1.11 mg/dL). Liver Function Tests: Recent Labs  Lab 03/09/20 1230 03/10/20 0416  AST 75* 38  ALT 73* 47*   ALKPHOS 181* 132*  BILITOT 0.9 0.8  PROT 7.2 6.3*  ALBUMIN 2.4* 2.1*   No results for input(s): LIPASE, AMYLASE in the last 168 hours. No results for input(s): AMMONIA in the last 168 hours. Coagulation Profile: Recent Labs  Lab 03/05/20 1551 03/09/20 1230  INR 1.2 1.4*   Cardiac Enzymes: No results for input(s): CKTOTAL, CKMB, CKMBINDEX, TROPONINI in the last 168 hours. BNP (last 3 results) No results for input(s): PROBNP in the last 8760 hours. HbA1C: No results for input(s): HGBA1C in the last 72 hours. CBG: No results for input(s): GLUCAP in the last 168 hours. Lipid Profile: No results for input(s): CHOL, HDL, LDLCALC, TRIG, CHOLHDL, LDLDIRECT in the last 72 hours. Thyroid Function Tests: No results for input(s): TSH, T4TOTAL, FREET4, T3FREE, THYROIDAB in the last 72 hours. Anemia Panel: No results for input(s): VITAMINB12, FOLATE, FERRITIN, TIBC, IRON, RETICCTPCT in the last 72 hours. Sepsis Labs: Recent Labs  Lab 03/04/20 1517 03/04/20 1742 03/09/20 1220  PROCALCITON 0.49  --   --   LATICACIDVEN 1.3 1.5 1.7    Recent Results (from the past 240 hour(s))  Blood Culture (routine x 2)     Status: None   Collection Time: 03/04/20 12:18 PM   Specimen: BLOOD  Result Value Ref Range Status   Specimen Description BLOOD RARM [facility]  Final   Special Requests   Final    BOTTLES DRAWN AEROBIC AND ANAEROBIC Blood Culture adequate volume   Culture   Final    NO GROWTH 5 DAYS Performed at [facility], [address], [place], [place] [address]    Report Status 03/09/2020 FINAL  Final  Blood Culture (routine x 2)     Status: None   Collection Time: 03/04/20  3:16 PM   Specimen: BLOOD  Result Value Ref Range Status   Specimen Description BLOOD L HAND  Final   Special Requests   Final    BOTTLES DRAWN AEROBIC AND ANAEROBIC Blood Culture adequate volume   Culture   Final    NO GROWTH 5 DAYS Performed at [facility], [address] [place],  [place] [address]    Report Status 03/09/2020 FINAL  Final  Respiratory Panel by RT PCR (Flu A&B, Covid) - Nasopharyngeal Swab     Status: None   Collection Time: 03/04/20  3:17 PM   Specimen: Nasopharyngeal Swab  Result Value Ref Range Status   SARS Coronavirus 2 by RT PCR NEGATIVE NEGATIVE Final    Comment: (NOTE) SARS-CoV-2 target nucleic acids are NOT DETECTED. The SARS-CoV-2 RNA is generally detectable in upper respiratoy specimens during the acute phase of infection. The lowest concentration of SARS-CoV-2 viral copies this assay can detect is 131 copies/mL. A negative result does not preclude SARS-Cov-2 infection and should not be used as the sole basis for treatment or other patient management decisions. A negative result may occur with  improper specimen collection/handling, submission of specimen other than nasopharyngeal swab, presence  of viral mutation(s) within the areas targeted by this assay, and inadequate number of viral copies (<131 copies/mL). A negative result must be combined with clinical observations, patient history, and epidemiological information. The expected result is Negative. Fact Sheet for Patients:  PinkCheek.be Fact Sheet for Healthcare Providers:  GravelBags.it This test is not yet ap proved or cleared by the [place] FDA and  has been authorized for detection and/or diagnosis of SARS-CoV-2 by FDA under an Emergency Use Authorization (EUA). This EUA will remain  in effect (meaning this test can be used) for the duration of the COVID-19 declaration under Section 564(b)(1) of the Act, 21 U.S.C. section 360bbb-3(b)(1), unless the authorization is terminated or revoked sooner.    Influenza A by PCR NEGATIVE NEGATIVE Final   Influenza B by PCR NEGATIVE NEGATIVE Final    Comment: (NOTE) The Xpert Xpress SARS-CoV-2/FLU/RSV assay is intended as an aid in  the diagnosis of influenza from Nasopharyngeal  swab specimens and  should not be used as a sole basis for treatment. Nasal washings and  aspirates are unacceptable for Xpert Xpress SARS-CoV-2/FLU/RSV  testing. Fact Sheet for Patients: PinkCheek.be Fact Sheet for Healthcare Providers: GravelBags.it This test is not yet approved or cleared by the [place] FDA and  has been authorized for detection and/or diagnosis of SARS-CoV-2 by  FDA under an Emergency Use Authorization (EUA). This EUA will remain  in effect (meaning this test can be used) for the duration of the  Covid-19 declaration under Section 564(b)(1) of the Act, 21  U.S.C. section 360bbb-3(b)(1), unless the authorization is  terminated or revoked. Performed at [facility], [place], [place] [address]   SARS CORONAVIRUS 2 (TAT 6-24 HRS) Nasopharyngeal Nasopharyngeal Swab     Status: None   Collection Time: 03/05/20  2:35 PM   Specimen: Nasopharyngeal Swab  Result Value Ref Range Status   SARS Coronavirus 2 NEGATIVE NEGATIVE Final    Comment: (NOTE) SARS-CoV-2 target nucleic acids are NOT DETECTED. The SARS-CoV-2 RNA is generally detectable in upper and lower respiratory specimens during the acute phase of infection. Negative results do not preclude SARS-CoV-2 infection, do not rule out co-infections with other pathogens, and should not be used as the sole basis for treatment or other patient management decisions. Negative results must be combined with clinical observations, patient history, and epidemiological information. The expected result is Negative. Fact Sheet for Patients: SugarRoll.be Fact Sheet for Healthcare Providers: https://www.woods-mathews.com/ This test is not yet approved or cleared by the [place] FDA and  has been authorized for detection and/or diagnosis of SARS-CoV-2 by FDA under an Emergency Use Authorization  (EUA). This EUA will remain  in effect (meaning this test can be used) for the duration of the COVID-19 declaration under Section 56 4(b)(1) of the Act, 21 U.S.C. section 360bbb-3(b)(1), unless the authorization is terminated or revoked sooner. Performed at [facility], [place] [address], [place], [place] [address]   Culture, blood (Routine x 2)     Status: None (Preliminary result)   Collection Time: 03/09/20 12:31 PM   Specimen: BLOOD  Result Value Ref Range Status   Specimen Description BLOOD BLOOD RIGHT FOREARM  Final   Special Requests   Final    BOTTLES DRAWN AEROBIC AND ANAEROBIC Blood Culture results may not be optimal due to an inadequate volume of blood received in culture bottles   Culture   Final    NO GROWTH 2 DAYS Performed at [facility], [address] [place], [place] [address]    Report Status PENDING  Incomplete  Culture, blood (Routine x 2)     Status: None (Preliminary result)   Collection Time: 03/09/20 12:40 PM   Specimen: BLOOD  Result Value Ref Range Status   Specimen Description BLOOD LEFT ANTECUBITAL  Final   Special Requests   Final    BOTTLES DRAWN AEROBIC AND ANAEROBIC Blood Culture results may not be optimal due to an excessive volume of blood received in culture bottles   Culture   Final    NO GROWTH 2 DAYS Performed at [facility], [address] [place], [place] [address]    Report Status PENDING  Incomplete  Respiratory Panel by RT PCR (Flu A&B, Covid) - Nasopharyngeal Swab     Status: None   Collection Time: 03/09/20  2:32 PM   Specimen: Nasopharyngeal Swab  Result Value Ref Range Status   SARS Coronavirus 2 by RT PCR NEGATIVE NEGATIVE Final    Comment: (NOTE) SARS-CoV-2 target nucleic acids are NOT DETECTED. The SARS-CoV-2 RNA is generally detectable in upper respiratoy specimens during the acute phase of infection. The lowest concentration of SARS-CoV-2 viral copies this assay can detect is 131 copies/mL. A  negative result does not preclude SARS-Cov-2 infection and should not be used as the sole basis for treatment or other patient management decisions. A negative result may occur with  improper specimen collection/handling, submission of specimen other than nasopharyngeal swab, presence of viral mutation(s) within the areas targeted by this assay, and inadequate number of viral copies (<131 copies/mL). A negative result must be combined with clinical observations, patient history, and epidemiological information. The expected result is Negative. Fact Sheet for Patients:  PinkCheek.be Fact Sheet for Healthcare Providers:  GravelBags.it This test is not yet ap proved or cleared by the [place] FDA and  has been authorized for detection and/or diagnosis of SARS-CoV-2 by FDA under an Emergency Use Authorization (EUA). This EUA will remain  in effect (meaning this test can be used) for the duration of the COVID-19 declaration under Section 564(b)(1) of the Act, 21 U.S.C. section 360bbb-3(b)(1), unless the authorization is terminated or revoked sooner.    Influenza A by PCR NEGATIVE NEGATIVE Final   Influenza B by PCR NEGATIVE NEGATIVE Final    Comment: (NOTE) The Xpert Xpress SARS-CoV-2/FLU/RSV assay is intended as an aid in  the diagnosis of influenza from Nasopharyngeal swab specimens and  should not be used as a sole basis for treatment. Nasal washings and  aspirates are unacceptable for Xpert Xpress SARS-CoV-2/FLU/RSV  testing. Fact Sheet for Patients: PinkCheek.be Fact Sheet for Healthcare Providers: GravelBags.it This test is not yet approved or cleared by the [place] FDA and  has been authorized for detection and/or diagnosis of SARS-CoV-2 by  FDA under an Emergency Use Authorization (EUA). This EUA will remain  in effect (meaning this test can be used) for  the duration of the  Covid-19 declaration under Section 564(b)(1) of the Act, 21  U.S.C. section 360bbb-3(b)(1), unless the authorization is  terminated or revoked. Performed at [facility], [address], [place], [place] [address]          Radiology Studies: DG Chest 2 View  Result Date: 03/09/2020 CLINICAL DATA:  Follow-up pneumonia. EXAM: CHEST - 2 VIEW COMPARISON:  03/04/2020 FINDINGS: UPPER limits normal heart size again noted. Bilateral airspace opacities are again noted, with increased RIGHT middle lobe consolidation/atelectasis. A trace RIGHT pleural effusion is noted. No  pneumothorax or acute bony abnormality. IMPRESSION: Bilateral airspace opacities again noted with increased RIGHT middle lobe consolidation/atelectasis. Electronically Signed   By: Margarette Canada M.D.   On: 03/09/2020 13:12   CT Chest W Contrast  Result Date: 03/09/2020 CLINICAL DATA:  74 year old male with history of pneumonia. Mid right back pain. Swelling in the right flank. EXAM: CT CHEST, ABDOMEN, AND PELVIS WITH CONTRAST TECHNIQUE: Multidetector CT imaging of the chest, abdomen and pelvis was performed following the standard protocol during bolus administration of intravenous contrast. CONTRAST:  45mL OMNIPAQUE IOHEXOL 300 MG/ML  SOLN COMPARISON:  Chest CT 03/05/2020. CT the abdomen and pelvis 05/01/2004. FINDINGS: CT CHEST FINDINGS Cardiovascular: Heart size is normal. Small amount of pericardial fluid and/or thickening, slightly increased compared to the prior study, but unlikely to be of hemodynamic significance at this time. Atherosclerotic calcifications in the thoracic aorta as well as the left main, left anterior descending, left circumflex and right coronary arteries. Mediastinum/Nodes: Multiple enlarged mediastinal and hilar lymph nodes bilaterally, bulkiest of which is in the low right paratracheal nodal station measuring 2.3 cm in short axis, low left paratracheal nodal station measuring 2.8 cm  in short axis, and AP window measuring 1.9 cm in short axis. Hilar lymph nodes measure up to 2 cm in short axis. Esophagus is unremarkable in appearance. Esophagus is unremarkable in appearance. No axillary lymphadenopathy. Lungs/Pleura: Mass-like enlargement of the right infrahilar region best appreciated on axial image 40 of series 504 estimated to measure approximately 4.6 x 4.3 cm with postobstructive changes in the right middle lobe which appears to reflect a combination of consolidation and atelectasis. Multiple other pulmonary nodules are again noted throughout the lungs bilaterally, largest of which is in the right lower lobe measuring 2.5 x 2.1 cm (axial image 126 of series 505). Musculoskeletal: Enumerable lytic lesions throughout the visualized axial and appendicular skeleton, compatible with widespread metastatic disease to the bones. CT ABDOMEN PELVIS FINDINGS Hepatobiliary: 1.5 x 1.3 cm centrally low-attenuation lesion with some peripheral nodular enhancement and progressive centripetal filling in segment 3 of the liver, compatible with a cavernous hemangioma. No other suspicious hepatic lesions. No intra or extrahepatic biliary ductal dilatation. Gallbladder is normal in appearance. Pancreas: No pancreatic mass. No pancreatic ductal dilatation. No pancreatic or peripancreatic fluid collections or inflammatory changes. Spleen: Unremarkable. Adrenals/Urinary Tract: Bilateral kidneys and adrenal glands are normal in appearance. No hydroureteronephrosis. Urinary bladder is normal in appearance. Stomach/Bowel: Normal appearance of the stomach. No pathologic dilatation of small bowel or colon. Several colonic diverticulae are noted, without surrounding inflammatory changes to suggest an acute diverticulitis at this time. The appendix is not confidently identified and may be surgically absent. Regardless, there are no inflammatory changes noted adjacent to the cecum to suggest the presence of an acute  appendicitis at this time. Vascular/Lymphatic: Aortic atherosclerosis, without evidence of aneurysm or dissection noted in the abdominal or pelvic vasculature. No lymphadenopathy noted in the abdomen or pelvis. Reproductive: Prostate gland is enlarged and heterogeneous in appearance measuring 5.8 x 4.3 x 6.1 cm. Other: Small volume of ascites.  No pneumoperitoneum. Musculoskeletal: Numerous lytic osseous lesions are noted throughout the visualized axial and appendicular structures, highly concerning for widespread metastatic disease to the bones. IMPRESSION: 1. 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 The primary malignancy is not confidently identified on today's examination, however, given the mass-like appearance of the right  infrahilar region with the postobstructive changes in the right middle lobe, a primary bronchogenic carcinoma in the central right middle lobe is suspected. 2. Colonic diverticulosis without evidence of acute diverticulitis at this time. 3. 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 4. Severe prostatomegaly. 5. Aortic atherosclerosis, in addition to left main and 3 vessel coronary artery disease. Assessment for potential risk factor modification, dietary therapy or pharmacologic therapy may be warranted, if clinically indicated. 6. Additional incidental findings, as above. Electronically Signed   By: Vinnie Langton M.D.   On: 03/09/2020 14:45   CT Abdomen Pelvis W Contrast  Result Date: 03/09/2020 CLINICAL DATA:  74 year old male with history of pneumonia. Mid right back pain. Swelling in the right flank. EXAM: CT CHEST, ABDOMEN, AND PELVIS WITH CONTRAST TECHNIQUE: Multidetector CT imaging of the chest, abdomen and pelvis was performed following the  standard protocol during bolus administration of intravenous contrast. CONTRAST:  74mL OMNIPAQUE IOHEXOL 300 MG/ML  SOLN COMPARISON:  Chest CT 03/05/2020. CT the abdomen and pelvis 05/01/2004. FINDINGS: CT CHEST FINDINGS Cardiovascular: Heart size is normal. Small amount of pericardial fluid and/or thickening, slightly increased compared to the prior study, but unlikely to be of hemodynamic significance at this time. Atherosclerotic calcifications in the thoracic aorta as well as the left main, left anterior descending, left circumflex and right coronary arteries. Mediastinum/Nodes: Multiple enlarged mediastinal and hilar lymph nodes bilaterally, bulkiest of which is in the low right paratracheal nodal station measuring 2.3 cm in short axis, low left paratracheal nodal station measuring 2.8 cm in short axis, and AP window measuring 1.9 cm in short axis. Hilar lymph nodes measure up to 2 cm in short axis. Esophagus is unremarkable in appearance. Esophagus is unremarkable in appearance. No axillary lymphadenopathy. Lungs/Pleura: Mass-like enlargement of the right infrahilar region best appreciated on axial image 40 of series 504 estimated to measure approximately 4.6 x 4.3 cm with postobstructive changes in the right middle lobe which appears to reflect a combination of consolidation and atelectasis. Multiple other pulmonary nodules are again noted throughout the lungs bilaterally, largest of which is in the right lower lobe measuring 2.5 x 2.1 cm (axial image 126 of series 505). Musculoskeletal: Enumerable lytic lesions throughout the visualized axial and appendicular skeleton, compatible with widespread metastatic disease to the bones. CT ABDOMEN PELVIS FINDINGS Hepatobiliary: 1.5 x 1.3 cm centrally low-attenuation lesion with some peripheral nodular enhancement and progressive centripetal filling in segment 3 of the liver, compatible with a cavernous hemangioma. No other suspicious hepatic lesions. No intra or  extrahepatic biliary ductal dilatation. Gallbladder is normal in appearance. Pancreas: No pancreatic mass. No pancreatic ductal dilatation. No pancreatic or peripancreatic fluid collections or inflammatory changes. Spleen: Unremarkable. Adrenals/Urinary Tract: Bilateral kidneys and adrenal glands are normal in appearance. No hydroureteronephrosis. Urinary bladder is normal in appearance. Stomach/Bowel: Normal appearance of the stomach. No pathologic dilatation of small bowel or colon. Several colonic diverticulae are noted, without surrounding inflammatory changes to suggest an acute diverticulitis at this time. The appendix is not confidently identified and may be surgically absent. Regardless, there are no inflammatory changes noted adjacent to the cecum to suggest the presence of an acute appendicitis at this time. Vascular/Lymphatic: Aortic atherosclerosis, without evidence of aneurysm or dissection noted in the abdominal or pelvic vasculature. No lymphadenopathy noted in the abdomen or pelvis. Reproductive: Prostate gland is enlarged and heterogeneous in appearance measuring 5.8 x 4.3 x 6.1 cm. Other: Small volume of ascites.  No pneumoperitoneum. Musculoskeletal: Numerous lytic osseous  lesions are noted throughout the visualized axial and appendicular structures, highly concerning for widespread metastatic disease to the bones. IMPRESSION: 1. 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 The primary malignancy is not confidently identified on today's examination, however, given the mass-like appearance of the right infrahilar region with the postobstructive changes in the right middle lobe, a primary bronchogenic carcinoma in the central right middle lobe is suspected. 2. Colonic diverticulosis without  evidence of acute diverticulitis at this time. 3. 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 4. Severe prostatomegaly. 5. Aortic atherosclerosis, in addition to left main and 3 vessel coronary artery disease. Assessment for potential risk factor modification, dietary therapy or pharmacologic therapy may be warranted, if clinically indicated. 6. Additional incidental findings, as above. Electronically Signed   By: Vinnie Langton M.D.   On: 03/09/2020 14:45        Scheduled Meds: . amLODipine  10 mg Oral Daily  . benzonatate  200 mg Oral TID  . enoxaparin (LOVENOX) injection  40 mg Subcutaneous Q24H  . metoprolol tartrate  25 mg Oral BID  . nicotine  7 mg Transdermal Q24H  . sodium chloride flush  3 mL Intravenous Q12H   Continuous Infusions: . sodium chloride Stopped (03/10/20 2353)  . azithromycin 500 mg (03/10/20 1153)  . ceFEPime (MAXIPIME) IV Stopped (03/10/20 2237)     LOS: 2 days    Time spent: 33 mins     Wyvonnia Dusky, MD [facility] Pager [phone]  If 7PM-7AM, please contact night-coverage [email or web address] 03/11/2020, 8:20 AM

## 2020-03-11 NOTE — Plan of Care (Signed)
Patient is tolerating diet well but still complaining of back pain on his right side. Pain medication given.  Will continue to monitor.  Christene Slates 03/11/2020  Christene Slates

## 2020-03-11 NOTE — Progress Notes (Signed)
Pt in bed upright in chair position . HFNC sats 92%. Pt denies pain or distress at present . Pt has non productive cough at times. Pt lactic acid 3.6 . New orders for NS bolus 573ml. Pt sis eva patterson called and was able to communicate with patient via speaker phone and pt. gave verbal consent to update sister at this moment on current condition. Pt sister was informed that a password has been set and would need to contact wife for further updates.Marland Kitchen

## 2020-03-11 NOTE — Evaluation (Signed)
Occupational Therapy Evaluation Patient Details Name: Troy Shepherd MRN: [id number] DOB: 03/24/1946 Today's Date: 03/11/2020    History of Present Illness Per MD notes: PT is a 74 y.o. male presents to ED on 03/04/2020 with chest pain and cough. PMH includes HTN, and alcohol abuse. Current MD assessment includes bilateral pneumonia, acute hypoxic resp failure, HTN, and elevated troponin likely demand ischemia   Clinical Impression   Troy Shepherd was seen for OT evaluation this date. Pt was independent in all ADL and functional mobility, living in a one story apt with his wife. Pt on 2 liters of O2 at home. Pt reports becoming easily fatigued or out of breath with minimal exertion. Pt currently requires SETUP + SBA + increased time for ADLs due to current functional impairments (See OT Problem List below). Pt educated in energy conservation strategies including pursed lip breathing, activity pacing, home/routines modifications, AE/DME, prioritizing of meaningful occupations, and falls prevention. Pt verbalized understanding and would benefit from additional skilled OT services to maximize recall and carryover of learned techniques and facilitate implementation of learned techniques into daily routines. Upon discharge, recommend [place] services.      Follow Up Recommendations  Home health OT;Supervision/Assistance - 24 hour    Equipment Recommendations       Recommendations for Other Services       Precautions / Restrictions Restrictions Weight Bearing Restrictions: No      Mobility Bed Mobility Overal bed mobility: Modified Independent             General bed mobility comments: Extra time and effort, no physical assistance needed, 3L [place]  Transfers Overall transfer level: Needs assistance   Transfers: Sit to/from Stand Sit to Stand: Supervision         General transfer comment: SBA sit<>stand and walking to recliner - O2 and increased time, light furniture walking    Balance  Overall balance assessment: Needs assistance Sitting-balance support: No upper extremity supported;Feet unsupported Sitting balance-Leahy Scale: Normal Sitting balance - Comments: Rested elbows on knees when fatigued 2/2 [place] removed for face washing                                    ADL either performed or assessed with clinical judgement   ADL Overall ADL's : Needs assistance/impaired                                       General ADL Comments: SBA pull mesh briefs up from knees in standing (NA assisting upon OT arrival to room). SETUP facewashing seated EOB. SETUP self-feeding seated in recliner.       Vision Baseline Vision/History: Wears glasses Wears Glasses: Reading only Patient Visual Report: No change from baseline       Perception     Praxis      Pertinent Vitals/Pain Pain Assessment: No/denies pain     Hand Dominance Right   Extremity/Trunk Assessment Upper Extremity Assessment Upper Extremity Assessment: Overall WFL for tasks assessed   Lower Extremity Assessment Lower Extremity Assessment: Generalized weakness       Communication Communication Communication: No difficulties   Cognition Arousal/Alertness: Awake/alert Behavior During Therapy: WFL for tasks assessed/performed Overall Cognitive Status: Within Functional Limits for tasks assessed  General Comments  Seated in recliner at end of session: SpO2 93% on 3L [place]    Exercises Other Exercises Other Exercises: Pt educated re: OT role, discharge recommendations, energy conservation strategies, falls prevention, PLB Other Exercises: bed mobility, sit<>stand x2, transfer to chair, positioning for eating, meal setup, face washing, PLB, dynamic/static sitting balance/tolerance   Shoulder Instructions      Home Living Family/patient expects to be discharged to:: Private residence Living Arrangements: Spouse/significant  other Available Help at Discharge: Available 24 hours/day;Family Type of Home: Apartment Home Access: Level entry     Home Layout: One level     Bathroom Shower/Tub: Occupational psychologist: Standard Bathroom Accessibility: Yes How Accessible: Accessible via walker Home Equipment: Grab bars - toilet;Grab bars - tub/shower;Toilet riser          Prior Functioning/Environment Level of Independence: Independent        Comments: 2L O2 at home. Community amb, ind with ADL's, No AD use, denies any falls in past 6 months. Pt does not drive, friends assist with groceries.         OT Problem List: Decreased strength;Decreased activity tolerance;Decreased coordination;Decreased safety awareness;Decreased knowledge of use of DME or AE      OT Treatment/Interventions: Self-care/ADL training;Therapeutic exercise;Neuromuscular education;Energy conservation;DME and/or AE instruction;Therapeutic activities;Visual/perceptual remediation/compensation;Patient/family education;Balance training    OT Goals(Current goals can be found in the care plan section) Acute Rehab OT Goals Patient Stated Goal: return to PLOF OT Goal Formulation: With patient Time For Goal Achievement: 03/25/20 Potential to Achieve Goals: Good ADL Goals Pt Will Perform Grooming: with modified independence;standing(c LRAD PRN and no cues for safety) Pt Will Perform Lower Body Dressing: with modified independence;sit to/from stand(c LRAD & AE PRN) Pt Will Transfer to Toilet: with modified independence;ambulating;regular height toilet(c LRAD PRN) Pt Will Perform Toileting - Clothing Manipulation and hygiene: with modified independence;sit to/from stand(c LRAD PRN)  OT Frequency: Min 1X/week   Barriers to D/C: Inaccessible home environment          Co-evaluation              AM-PAC OT "6 Clicks" Daily Activity     Outcome Measure Help from another person eating meals?: A Little Help from another  person taking care of personal grooming?: A Little Help from another person toileting, which includes using toliet, bedpan, or urinal?: A Little Help from another person bathing (including washing, rinsing, drying)?: A Lot Help from another person to put on and taking off regular upper body clothing?: A Little Help from another person to put on and taking off regular lower body clothing?: A Little 6 Click Score: 17   End of Session Equipment Utilized During Treatment: Oxygen(3L ) Nurse Communication: Mobility status  Activity Tolerance: Patient tolerated treatment well Patient left: in chair;with call bell/phone within reach;with chair alarm set  OT Visit Diagnosis: Unsteadiness on feet (R26.81);Other abnormalities of gait and mobility (R26.89)                Time: 2774-1287 OT Time Calculation (min): 33 min Charges:  OT General Charges $OT Visit: 1 Visit OT Evaluation $OT Eval Moderate Complexity: 1 Mod OT Treatments $Self Care/Home Management : 23-37 mins  Dessie Coma, M.S. OTR/L  03/11/20, 12:25 PM

## 2020-03-11 NOTE — Progress Notes (Signed)
Pt trailed on BiPAP per MD request, pt did not tol. Pt placed back on HFNC.

## 2020-03-11 NOTE — Progress Notes (Signed)
Patient has been seen and examined, full consult note to follow.  I have reviewed all the database and have examined the patient thoroughly.  The patient was in extreme respiratory distress with atrial fibrillation RVR when I first evaluated him.  He was not tolerating Cardizem infusion ordered for heart rate control.  He has been switched to amiodarone.  He was in tripod position on high flow nasal cannula.  He had intercostal retractions and thoracoabdominal asynchrony.  BiPAP was tried however the patient did not tolerated well and asked to remove it.  He was switched back to high flow nasal cannula.  After review of his record I discussed his condition with him and also with his wife.  The patient has lung cancer of uncertain type (pathology pending from biopsy 3/10) he has metastasis to bone, extensive mediastinal adenopathy and postobstructive pneumonia.  Chest x-ray shows that the right lower lobe mass appeared to have enlarged in the matter of days.  The patient has also severe emphysema noted on CT chest.  There is suggestion of a pericardial effusion on his most recent CT, bedside ultrasound evaluation did not show hemodynamically significant pericardial effusion.  The patient was provided morphine sulfate for control of dyspnea and anxiolysis.  Patient's wife presented at the bedside and they are in agreement to continue conservative measures but if the patient deteriorates further transition him to comfort care.  The patient is currently DNR/DNI.  We will continue with management of heart rate with amiodarone, high flow O2 to keep saturations between 88 to 92%, morphine as needed for dyspnea, bronchodilators and IV steroids.  I have made adjustments on these medications.  Recommend palliative care consultation.  As noted above full consult note to follow.    Renold Don, MD [facility]

## 2020-03-11 NOTE — Evaluation (Signed)
Physical Therapy Evaluation Patient Details Name: Troy Shepherd MRN: [id number] DOB: 11-27-1946 Today's Date: 03/11/2020   History of Present Illness  Per MD notes: PT is a 74 y.o. male presents to ED on 03/04/2020 with chest pain and cough. PMH includes HTN, and alcohol abuse. Current MD assessment includes bilateral pneumonia, acute hypoxic resp failure, HTN, and elevated troponin likely demand ischemia  Clinical Impression  Pt very lethargic at the start of session, reporting that he is "just having a hard time". Patient is able to comply with all education and cuing for RW management for energy conservation to complete STS transfers modI. Patient complies with pursed lip breathing education and is able to demonstrate decently. During 12ft ambulation PT O2 decreases to 83% and patient ultimately needs a seated rest with PLB to return this to WNL. Patient reports he does feel somewhat short of breath. It is PTs recommendation that patient needs 24hour assistance for O2 monitoring to prevent decline, if not SNF. If patient can obtain 24 hour assistance for medical monitoring then he is appropriate for HHPT to continue to improve energy conservation and independence. Would benefit from skilled PT to address above deficits and promote optimal return to PLOF.     Follow Up Recommendations Home health PT;Supervision for mobility/OOB    Equipment Recommendations  None recommended by PT    Recommendations for Other Services       Precautions / Restrictions Restrictions Weight Bearing Restrictions: No      Mobility  Bed Mobility Overal bed mobility: Modified Independent             General bed mobility comments: Extra time and effort, no physical assistance needed, 3L [place]  Transfers Overall transfer level: Needs assistance Equipment used: Rolling walker (2 wheeled) Transfers: Sit to/from Stand Sit to Stand: Supervision         General transfer comment: SBA sit<>stand  with cuing  needed for RW management; O2 monitoring  Ambulation/Gait Ambulation/Gait assistance: Supervision Gait Distance (Feet): 50 Feet   Gait Pattern/deviations: Decreased step length - right;Decreased step length - left Gait velocity: decreased   General Gait Details: decrease step length, cuing neeed for RW safety, O2 monitored with drop to 83%, pt able to improve with pursed lip breathing. no LOB  Financial trader [place] (Stroke Patients Only)       Balance Overall balance assessment: Needs assistance Sitting-balance support: No upper extremity supported;Feet unsupported Sitting balance-Leahy Scale: Normal Sitting balance - Comments: Rested elbows on knees when fatigued 2/2 [place] removed for face washing    Standing balance support: No upper extremity supported;During functional activity Standing balance-Leahy Scale: Good Standing balance comment: with static standing and with amb, pt did not require UE support to maintain balance               High Level Balance Comments: Tandem stance with UE reaching task - pt had several instances of LOB and had difficulty reaching outside of his BOS in tandem stance             Pertinent Vitals/Pain Pain Assessment: No/denies pain    Home Living Family/patient expects to be discharged to:: Private residence Living Arrangements: Spouse/significant other Available Help at Discharge: Available 24 hours/day;Family Type of Home: Apartment Home Access: Level entry     Home Layout: One level Home Equipment: Grab bars - toilet;Grab bars - tub/shower;Toilet riser  Prior Function Level of Independence: Independent         Comments: 2L O2 at home since last week hospital admission. Community amb, ind with ADL's, No AD use, denies any falls in past 6 months. Pt does not drive (reports he can physically but that he does not have a license), friends assist with groceries.     Hand Dominance    Dominant Hand: Right    Extremity/Trunk Assessment   Upper Extremity Assessment Upper Extremity Assessment: Defer to OT evaluation    Lower Extremity Assessment Lower Extremity Assessment: Generalized weakness    Cervical / Trunk Assessment Cervical / Trunk Assessment: Normal  Communication   Communication: No difficulties  Cognition Arousal/Alertness: Lethargic Behavior During Therapy: WFL for tasks assessed/performed Overall Cognitive Status: Within Functional Limits for tasks assessed                                        General Comments General comments (skin integrity, edema, etc.): Seated in recliner at end of session: SpO2 93% on 3L [place]    Exercises Other Exercises Other Exercises: Patient educated on transfer with RW and management throughout ambulation (87ft). Patient able to comply with cuing and education for this. Educationon energy conservation with use of ADs and pursed lip breathing with demonstrated and verbalized understanding. Patient eventually requires seated rest to continue pursed lip breathing to return O2 to normal levels Other Exercises: bed mobility, sit<>stand x2, transfer to chair, positioning for eating, meal setup, face washing, PLB, dynamic/static sitting balance/tolerance   Assessment/Plan    PT Assessment Patient needs continued PT services  PT Problem List Decreased strength;Decreased mobility;Decreased range of motion;Decreased activity tolerance;Cardiopulmonary status limiting activity;Decreased balance       PT Treatment Interventions DME instruction;Therapeutic exercise;Gait training;Balance training;Stair training;Functional mobility training;Therapeutic activities    PT Goals (Current goals can be found in the Care Plan section)  Acute Rehab PT Goals Patient Stated Goal: return to PLOF PT Goal Formulation: With patient Time For Goal Achievement: 03/18/20 Potential to Achieve Goals: Good    Frequency Min  2X/week   Barriers to discharge Decreased caregiver support      Co-evaluation               AM-PAC PT "6 Clicks" Mobility  Outcome Measure Help needed turning from your back to your side while in a flat bed without using bedrails?: A Little Help needed moving from lying on your back to sitting on the side of a flat bed without using bedrails?: A Little Help needed moving to and from a bed to a chair (including a wheelchair)?: A Little Help needed standing up from a chair using your arms (e.g., wheelchair or bedside chair)?: A Little Help needed to walk in hospital room?: A Little Help needed climbing 3-5 steps with a railing? : A Little 6 Click Score: 18    End of Session Equipment Utilized During Treatment: Gait belt;Oxygen Activity Tolerance: Patient tolerated treatment well Patient left: in chair;with call bell/phone within reach Nurse Communication: Mobility status PT Visit Diagnosis: Muscle weakness (generalized) (M62.81);Difficulty in walking, not elsewhere classified (R26.2);Unsteadiness on feet (R26.81)    Time: 1610-9604 PT Time Calculation (min) (ACUTE ONLY): 16 min   Charges:   PT Evaluation $PT Eval Moderate Complexity: 1 Mod PT Treatments $Therapeutic Activity: 8-22 mins       Shelton Silvas PT, DPT  [place] 03/11/2020, 1:17  PM

## 2020-03-11 NOTE — Progress Notes (Signed)
I was able to wean patient down to 3L of oxygen nasal cannula.  He was 94% on 3L.  Will continue to monitor.   Christene Slates  03/11/2020  12:02 AM

## 2020-03-11 NOTE — Progress Notes (Signed)
[name] visited pt. while rounding on ICU; pt. sitting up in bed, on high-flow nasal canula.  Pt. very slow in responding to CH's questions, seemed very fatigued.  [name] may attempt follow up visit during the day.  No further needs expressed.    03/11/20 2100  Clinical Encounter Type  Visited With Patient  Visit Type Initial;Critical Care  Referral From Other (Comment) (Routine Rounding)

## 2020-03-12 ENCOUNTER — Other Ambulatory Visit: Payer: Self-pay | Admitting: Pathology

## 2020-03-12 ENCOUNTER — Inpatient Hospital Stay (HOSPITAL_COMMUNITY)
Admit: 2020-03-12 | Discharge: 2020-03-12 | Disposition: A | Discharge status: Home / Self Care | Payer: Medicare HMO | Attending: Pulmonary Disease | Admitting: Pulmonary Disease

## 2020-03-12 ENCOUNTER — Telehealth: Payer: Self-pay | Admitting: Internal Medicine

## 2020-03-12 DIAGNOSIS — I313 Pericardial effusion (noninflammatory): Secondary | ICD-10-CM

## 2020-03-12 LAB — STREP PNEUMONIAE URINARY ANTIGEN: Strep Pneumo Urinary Antigen: NEGATIVE

## 2020-03-12 LAB — BLOOD GAS, VENOUS
Acid-Base Excess: 4.4 mmol/L — ABNORMAL HIGH (ref 0.0–2.0)
Bicarbonate: 29.2 mmol/L — ABNORMAL HIGH (ref 20.0–28.0)
FIO2: 0.45
O2 Saturation: 48.4 %
Patient temperature: 37
pCO2, Ven: 43 mmHg — ABNORMAL LOW (ref 44.0–60.0)
pH, Ven: 7.44 — ABNORMAL HIGH (ref 7.250–7.430)
pO2, Ven: <31 mmHg — CL (ref 32.0–45.0)

## 2020-03-12 LAB — URINALYSIS, COMPLETE (UACMP) WITH MICROSCOPIC
Bacteria, UA: NONE SEEN
Bilirubin Urine: NEGATIVE
Glucose, UA: NEGATIVE mg/dL
Ketones, ur: 20 mg/dL — AB
Leukocytes,Ua: NEGATIVE
Nitrite: NEGATIVE
Protein, ur: 30 mg/dL — AB
Specific Gravity, Urine: 1.021 (ref 1.005–1.030)
pH: 5 (ref 5.0–8.0)

## 2020-03-12 LAB — CBC
HCT: 33.6 % — ABNORMAL LOW (ref 39.0–52.0)
Hemoglobin: 11.2 g/dL — ABNORMAL LOW (ref 13.0–17.0)
MCH: 32.3 pg (ref 26.0–34.0)
MCHC: 33.3 g/dL (ref 30.0–36.0)
MCV: 96.8 fL (ref 80.0–100.0)
Platelets: 301 10*3/uL (ref 150–400)
RBC: 3.47 MIL/uL — ABNORMAL LOW (ref 4.22–5.81)
RDW: 13.5 % (ref 11.5–15.5)
WBC: 11.8 10*3/uL — ABNORMAL HIGH (ref 4.0–10.5)
nRBC: 0 % (ref 0.0–0.2)

## 2020-03-12 LAB — BASIC METABOLIC PANEL
Anion gap: 16 — ABNORMAL HIGH (ref 5–15)
BUN: 41 mg/dL — ABNORMAL HIGH (ref 8–23)
CO2: 23 mmol/L (ref 22–32)
Calcium: 10.8 mg/dL — ABNORMAL HIGH (ref 8.9–10.3)
Chloride: 96 mmol/L — ABNORMAL LOW (ref 98–111)
Creatinine, Ser: 1.53 mg/dL — ABNORMAL HIGH (ref 0.61–1.24)
GFR calc Af Amer: 51 mL/min — ABNORMAL LOW (ref >60)
GFR calc non Af Amer: 44 mL/min — ABNORMAL LOW (ref >60)
Glucose, Bld: 256 mg/dL — ABNORMAL HIGH (ref 70–99)
Potassium: 4.2 mmol/L (ref 3.5–5.1)
Sodium: 135 mmol/L (ref 135–145)

## 2020-03-12 LAB — PHOSPHORUS: Phosphorus: 4.4 mg/dL (ref 2.5–4.6)

## 2020-03-12 LAB — SURGICAL PATHOLOGY

## 2020-03-12 LAB — ECHOCARDIOGRAM COMPLETE
Height: 67 in
Weight: 2320 oz

## 2020-03-12 LAB — MAGNESIUM: Magnesium: 2 mg/dL (ref 1.7–2.4)

## 2020-03-12 MED ORDER — FENTANYL CITRATE (PF) 100 MCG/2ML IJ SOLN
12.5000 ug | Freq: Once | INTRAMUSCULAR | Status: AC
  Administered 2020-03-12: 12.5 ug via INTRAVENOUS
  Filled 2020-03-12: qty 2

## 2020-03-12 MED ORDER — BUDESONIDE 0.5 MG/2ML IN SUSP
0.5000 mg | Freq: Two times a day (BID) | RESPIRATORY_TRACT | Status: DC
  Administered 2020-03-12 – 2020-03-14 (×4): 0.5 mg via RESPIRATORY_TRACT
  Filled 2020-03-12 (×4): qty 2

## 2020-03-12 MED ORDER — IPRATROPIUM-ALBUTEROL 0.5-2.5 (3) MG/3ML IN SOLN: 3.0000 mL | Freq: Four times a day (QID) | RESPIRATORY_TRACT | Status: DC | PRN

## 2020-03-12 MED ORDER — METHYLPREDNISOLONE SODIUM SUCC 125 MG IJ SOLR
60.0000 mg | Freq: Two times a day (BID) | INTRAMUSCULAR | Status: DC
  Filled 2020-03-12: qty 2

## 2020-03-12 MED ORDER — MORPHINE SULFATE (PF) 4 MG/ML IV SOLN
4.0000 mg | Freq: Once | INTRAVENOUS | Status: AC
  Administered 2020-03-12: 4 mg via INTRAVENOUS
  Filled 2020-03-12: qty 1

## 2020-03-12 MED ORDER — METHYLPREDNISOLONE SODIUM SUCC 125 MG IJ SOLR
60.0000 mg | Freq: Four times a day (QID) | INTRAMUSCULAR | Status: DC
  Administered 2020-03-12 – 2020-03-14 (×8): 60 mg via INTRAVENOUS
  Filled 2020-03-12 (×7): qty 2

## 2020-03-12 MED ORDER — FENTANYL CITRATE (PF) 100 MCG/2ML IJ SOLN
12.5000 ug | INTRAMUSCULAR | Status: DC | PRN
  Administered 2020-03-13 (×4): 25 ug via INTRAVENOUS
  Filled 2020-03-12 (×4): qty 2

## 2020-03-12 NOTE — Progress Notes (Signed)
Pulmonary Medicine          Date: 03/12/2020,   MRN# [id number] Troy Shepherd October 29, 1946     AdmissionWeight: 65.8 kg                 CurrentWeight: 65.8 kg      CHIEF COMPLAINT:   Acute on chronic hypoxemic respiratory failure   SUBJECTIVE   Patient is sitting up in bed, states analgesia is adequate.    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    Overall with poor prognosis and palliative care services are on case.    PAST MEDICAL HISTORY   Past Medical History:  Diagnosis Date  . Alcohol abuse    quit 09/2014  . Arthritis   . Hypertension   . Leukopenia    resolved when stopped drinking 09/2014  . Pneumonia      SURGICAL HISTORY   Past Surgical History:  Procedure Laterality Date  . APPENDECTOMY    . COLONOSCOPY N/A 11/09/2015   Procedure: COLONOSCOPY;  Surgeon: Josefine Class, MD;  Location: [facility] ENDOSCOPY;  Service: Endoscopy;  Laterality: N/A;     FAMILY HISTORY   Family History  Problem Relation Age of Onset  . COPD Sister      SOCIAL HISTORY   Social History   Tobacco Use  . Smoking status: Former Smoker    Packs/day: 0.10    Years: 40.00    Pack years: 4.00    Types: Cigarettes    Quit date: 03/07/2020    Years since quitting: 0.0  . Smokeless tobacco: Never Used  Substance Use Topics  . Alcohol use: Yes    Alcohol/week: 3.0 standard drinks    Types: 3 Shots of liquor per week    Comment: last drink was 3/10  . Drug use: No     MEDICATIONS    Home Medication:    Current Medication:  Current Facility-Administered Medications:  .  0.9 %  sodium chloride infusion, , Intravenous, PRN, Wyvonnia Dusky, MD, Stopped at 03/11/20 0920 .  acetaminophen (TYLENOL) tablet 650 mg, 650 mg, Oral, Q6H PRN, Sharion Settler, NP, 650 mg at 03/11/20 0551 .  [COMPLETED] amiodarone (NEXTERONE) 1.8 mg/mL load via infusion 150 mg, 150 mg, Intravenous, Once, 150 mg at 03/11/20 1531  **FOLLOWED BY** [EXPIRED] amiodarone (NEXTERONE PREMIX) 360-4.14 MG/200ML-% (1.8 mg/mL) IV infusion, 60 mg/hr, Intravenous, Continuous, Stopped at 03/12/20 0950 **FOLLOWED BY** amiodarone (NEXTERONE PREMIX) 360-4.14 MG/200ML-% (1.8 mg/mL) IV infusion, 30 mg/hr, Intravenous, Continuous, Tyler Pita, MD, Last Rate: 16.67 mL/hr at 03/12/20 0514, 30 mg/hr at 03/12/20 0514 .  azithromycin (ZITHROMAX) 500 mg in sodium chloride 0.9 % 250 mL IVPB, 500 mg, Intravenous, Q24H, Wyvonnia Dusky, MD, Stopped at 03/11/20 1027 .  benzonatate (TESSALON) capsule 200 mg, 200 mg, Oral, TID, Wyvonnia Dusky, MD, 200 mg at 03/12/20 0957 .  bisacodyl (DULCOLAX) EC tablet 5 mg, 5 mg, Oral, Daily PRN, Wyvonnia Dusky, MD, 5 mg at 03/11/20 0836 .  Chlorhexidine Gluconate Cloth 2 % PADS 6 each, 6 each, Topical, Daily, Wyvonnia Dusky, MD, 6 each at 03/12/20 0957 .  enoxaparin (LOVENOX) injection 40 mg, 40 mg, Subcutaneous, Q24H, Wyvonnia Dusky, MD, 40 mg at 03/11/20 2121 .  ipratropium-albuterol (DUONEB) 0.5-2.5 (3) MG/3ML nebulizer solution 3 mL, 3 mL, Nebulization, Q4H, Tyler Pita, MD, 3 mL at 03/12/20 1220 .  ketorolac (TORADOL) tablet 10 mg, 10 mg, Oral, Q6H PRN, Sharion Settler, NP, 10  mg at 03/10/20 1823 .  methylPREDNISolone sodium succinate (SOLU-MEDROL) 125 mg/2 mL injection 80 mg, 80 mg, Intravenous, BID, Tyler Pita, MD, 80 mg at 03/12/20 0957 .  metoprolol tartrate (LOPRESSOR) injection 5 mg, 5 mg, Intravenous, Q8H PRN, Wyvonnia Dusky, MD, 5 mg at 03/11/20 1341 .  metoprolol tartrate (LOPRESSOR) tablet 25 mg, 25 mg, Oral, BID, Wyvonnia Dusky, MD, 25 mg at 03/12/20 0956 .  morphine 2 MG/ML injection 2 mg, 2 mg, Intravenous, Q1H PRN, Tyler Pita, MD, 2 mg at 03/11/20 2310 .  ondansetron (ZOFRAN) tablet 4 mg, 4 mg, Oral, Q6H PRN **OR** ondansetron (ZOFRAN) injection 4 mg, 4 mg, Intravenous, Q6H PRN, Wyvonnia Dusky, MD, 4 mg at 03/11/20 1826 .  oxyCODONE  (Oxy IR/ROXICODONE) immediate release tablet 10 mg, 10 mg, Oral, Q6H PRN, Wyvonnia Dusky, MD, 10 mg at 03/12/20 0957 .  piperacillin-tazobactam (ZOSYN) IVPB 3.375 g, 3.375 g, Intravenous, Q8H, Wyvonnia Dusky, MD, Last Rate: 12.5 mL/hr at 03/12/20 0512, 3.375 g at 03/12/20 0512 .  sodium bicarbonate 150 mEq in dextrose 5% 1000 mL infusion, 150 mEq, Intravenous, Continuous, Tyler Pita, MD, Last Rate: 100 mL/hr at 03/12/20 0500, Rate Verify at 03/12/20 0500 .  sodium chloride flush (NS) 0.9 % injection 3 mL, 3 mL, Intravenous, Q12H, Wyvonnia Dusky, MD, 3 mL at 03/12/20 4627    ALLERGIES   Patient has no known allergies.     REVIEW OF SYSTEMS    Review of Systems:  Gen:  Denies  fever, sweats, chills weigh loss  HEENT: Denies blurred vision, double vision, ear pain, eye pain, hearing loss, nose bleeds, sore throat Cardiac:  No dizziness, chest pain or heaviness, chest tightness,edema Resp:   Denies cough or sputum porduction, shortness of breath,wheezing, hemoptysis,  Gi: Denies swallowing difficulty, stomach pain, nausea or vomiting, diarrhea, constipation, bowel incontinence Gu:  Denies bladder incontinence, burning urine Ext:   Denies Joint pain, stiffness or swelling Skin: Denies  skin rash, easy bruising or bleeding or hives Endoc:  Denies polyuria, polydipsia , polyphagia or weight change Psych:   Denies depression, insomnia or hallucinations   Other:  All other systems negative   VS: BP (!) 118/95   Pulse (!) 105   Temp 97.8 F (36.6 C) (Oral)   Resp 14   Ht 5\' 7"  (1.702 m)   Wt 65.8 kg   SpO2 98%   BMI 22.71 kg/m      PHYSICAL EXAM    GENERAL:NAD, no fevers, chills, no weakness no fatigue HEAD: Normocephalic, atraumatic.  EYES: Pupils equal, round, reactive to light. Extraocular muscles intact. No scleral icterus.  MOUTH: Moist mucosal membrane. Dentition intact. No abscess noted.  EAR, NOSE, THROAT: Clear without exudates. No external  lesions.  NECK: Supple. No thyromegaly. No nodules. No JVD.  PULMONARY: decreased BS bilaterally CARDIOVASCULAR: S1 and S2. Regular rate and rhythm. No murmurs, rubs, or gallops. No edema. Pedal pulses 2+ bilaterally.  GASTROINTESTINAL: Soft, nontender, nondistended. No masses. Positive bowel sounds. No hepatosplenomegaly.  MUSCULOSKELETAL: No swelling, clubbing, or edema. Range of motion full in all extremities.  NEUROLOGIC: Cranial nerves II through XII are intact. No gross focal neurological deficits. Sensation intact. Reflexes intact.  SKIN: No ulceration, lesions, rashes, or cyanosis. Skin warm and dry. Turgor intact.  PSYCHIATRIC: Mood, affect within normal limits. The patient is awake, alert and oriented x 3. Insight, judgment intact.       IMAGING    DG Chest 2 View  Result  Date: 03/09/2020 CLINICAL DATA:  Follow-up pneumonia. EXAM: CHEST - 2 VIEW COMPARISON:  03/04/2020 FINDINGS: UPPER limits normal heart size again noted. Bilateral airspace opacities are again noted, with increased RIGHT middle lobe consolidation/atelectasis. A trace RIGHT pleural effusion is noted. No pneumothorax or acute bony abnormality. IMPRESSION: Bilateral airspace opacities again noted with increased RIGHT middle lobe consolidation/atelectasis. Electronically Signed   By: Margarette Canada M.D.   On: 03/09/2020 13:12   CT Chest W Contrast  Result Date: 03/09/2020 CLINICAL DATA:  74 year old male with history of pneumonia. Mid right back pain. Swelling in the right flank. EXAM: CT CHEST, ABDOMEN, AND PELVIS WITH CONTRAST TECHNIQUE: Multidetector CT imaging of the chest, abdomen and pelvis was performed following the standard protocol during bolus administration of intravenous contrast. CONTRAST:  8mL OMNIPAQUE IOHEXOL 300 MG/ML  SOLN COMPARISON:  Chest CT 03/05/2020. CT the abdomen and pelvis 05/01/2004. FINDINGS: CT CHEST FINDINGS Cardiovascular: Heart size is normal. Small amount of pericardial fluid and/or  thickening, slightly increased compared to the prior study, but unlikely to be of hemodynamic significance at this time. Atherosclerotic calcifications in the thoracic aorta as well as the left main, left anterior descending, left circumflex and right coronary arteries. Mediastinum/Nodes: Multiple enlarged mediastinal and hilar lymph nodes bilaterally, bulkiest of which is in the low right paratracheal nodal station measuring 2.3 cm in short axis, low left paratracheal nodal station measuring 2.8 cm in short axis, and AP window measuring 1.9 cm in short axis. Hilar lymph nodes measure up to 2 cm in short axis. Esophagus is unremarkable in appearance. Esophagus is unremarkable in appearance. No axillary lymphadenopathy. Lungs/Pleura: Mass-like enlargement of the right infrahilar region best appreciated on axial image 40 of series 504 estimated to measure approximately 4.6 x 4.3 cm with postobstructive changes in the right middle lobe which appears to reflect a combination of consolidation and atelectasis. Multiple other pulmonary nodules are again noted throughout the lungs bilaterally, largest of which is in the right lower lobe measuring 2.5 x 2.1 cm (axial image 126 of series 505). Musculoskeletal: Enumerable lytic lesions throughout the visualized axial and appendicular skeleton, compatible with widespread metastatic disease to the bones. CT ABDOMEN PELVIS FINDINGS Hepatobiliary: 1.5 x 1.3 cm centrally low-attenuation lesion with some peripheral nodular enhancement and progressive centripetal filling in segment 3 of the liver, compatible with a cavernous hemangioma. No other suspicious hepatic lesions. No intra or extrahepatic biliary ductal dilatation. Gallbladder is normal in appearance. Pancreas: No pancreatic mass. No pancreatic ductal dilatation. No pancreatic or peripancreatic fluid collections or inflammatory changes. Spleen: Unremarkable. Adrenals/Urinary Tract: Bilateral kidneys and adrenal glands are  normal in appearance. No hydroureteronephrosis. Urinary bladder is normal in appearance. Stomach/Bowel: Normal appearance of the stomach. No pathologic dilatation of small bowel or colon. Several colonic diverticulae are noted, without surrounding inflammatory changes to suggest an acute diverticulitis at this time. The appendix is not confidently identified and may be surgically absent. Regardless, there are no inflammatory changes noted adjacent to the cecum to suggest the presence of an acute appendicitis at this time. Vascular/Lymphatic: Aortic atherosclerosis, without evidence of aneurysm or dissection noted in the abdominal or pelvic vasculature. No lymphadenopathy noted in the abdomen or pelvis. Reproductive: Prostate gland is enlarged and heterogeneous in appearance measuring 5.8 x 4.3 x 6.1 cm. Other: Small volume of ascites.  No pneumoperitoneum. Musculoskeletal: Numerous lytic osseous lesions are noted throughout the visualized axial and appendicular structures, highly concerning for widespread metastatic disease to the bones. IMPRESSION: 1. Numerous pulmonary nodules in the  lungs bilaterally, bilateral hilar and mediastinal lymphadenopathy, and innumerable lytic lesions throughout the visualized axial and appendicular skeleton, indicative of malignancy with widespread metastatic disease. The primary malignancy is not confidently identified on today's examination, however, given the mass-like appearance of the right infrahilar region with the postobstructive changes in the right middle lobe, a primary bronchogenic carcinoma in the central right middle lobe is suspected. 2. Colonic diverticulosis without evidence of acute diverticulitis at this time. 3. 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 4. Severe prostatomegaly. 5. Aortic atherosclerosis, in addition to left main and 3 vessel coronary artery disease. Assessment for potential risk factor modification, dietary therapy or  pharmacologic therapy may be warranted, if clinically indicated. 6. Additional incidental findings, as above. Electronically Signed   By: Vinnie Langton M.D.   On: 03/09/2020 14:45   CT ANGIO CHEST PE W OR WO CONTRAST  Result Date: 03/05/2020 CLINICAL DATA:  Shortness of breath bilateral pneumonia, elevated D-dimer EXAM: CT ANGIOGRAPHY CHEST WITH CONTRAST TECHNIQUE: Multidetector CT imaging of the chest was performed using the standard protocol during bolus administration of intravenous contrast. Multiplanar CT image reconstructions and MIPs were obtained to evaluate the vascular anatomy. CONTRAST:  85mL OMNIPAQUE IOHEXOL 350 MG/ML SOLN COMPARISON:  Chest x-ray of 03/04/2020 FINDINGS: Cardiovascular: Calcified and noncalcified plaque throughout the thoracic aorta. No signs of acute aortic process. Small pericardial effusion. Heart size is normal. No signs of acute pulmonary embolism. Mediastinum/Nodes: Bulky mediastinal adenopathy. (Image 39, series 4): 2.9 cm AP window lymph node. (Image 38, series 4): 2.4 cm right paratracheal lymph node. Lymph nodes are seen throughout all nodal stations in the chest including the bilateral hila. Right hilar nodal tissue approximately 1.7 cm (image 50, series 4) anterior mediastinal/prevascular lymph nodes with enlargement as well. High AP window lymph node just below pre-vascular lymph nodes (image 39, series 4) 1.8 cm. Esophagus is grossly normal. No signs of frank absolute ir E adenopathy. 17 mm left thoracic inlet lymph node and similarly sized right thoracic inlet lymph nodes. Ovoid lymph node without fatty hilum in the left axilla measuring 10 mm. Lungs/Pleura: Nodular opacities throughout the chest with a background of ground-glass attenuation and septal thickening, largest area measuring 2.3 x 2.1 cm in the right lower lobe this shows low-density. Consolidative changes and volume loss in the right middle lobe. Narrowing of central airways at the right hilum also in  the left infrahilar region. Patchy opacities bilaterally throughout the chest with nodular features none as discrete as the area in the right lung base. Small right pleural effusion layers dependently. Upper Abdomen: No signs of acute finding in the upper abdomen. Musculoskeletal: Numerous areas of lytic change throughout the visualized skeleton, particularly the spine. These are too numerous to count with a focal area as an example on image 23 of series 4 measuring 11 mm. No signs of acute fracture. Review of the MIP images confirms the above findings. IMPRESSION: 1. No evidence of acute pulmonary embolism. 2. Bulky mediastinal and bilateral hilar adenopathy with nodular opacities throughout the chest and numerous areas of lytic change throughout the visualized skeleton. Findings concerning for primary pulmonary neoplasm or lymphoproliferative disorder with diffuse involvement/metastatic disease and with superimposed infection in the chest. 3. There was some mild nodal enlargement on previous exams. This is much worse and findings of diffuse bony lytic changes are also a new finding. 4. Some airway narrowing at the hila bilaterally worse on the right with some mild bronchial wall thickening. Superimposed infection  may be due to postobstructive changes. 5. Small right pleural effusion. 6. Small pericardial effusion. 7. Aortic atherosclerosis. Aortic Atherosclerosis (ICD10-I70.0). Electronically Signed   By: Zetta Bills M.D.   On: 03/05/2020 13:27   CT Abdomen Pelvis W Contrast  Result Date: 03/09/2020 CLINICAL DATA:  74 year old male with history of pneumonia. Mid right back pain. Swelling in the right flank. EXAM: CT CHEST, ABDOMEN, AND PELVIS WITH CONTRAST TECHNIQUE: Multidetector CT imaging of the chest, abdomen and pelvis was performed following the standard protocol during bolus administration of intravenous contrast. CONTRAST:  26mL OMNIPAQUE IOHEXOL 300 MG/ML  SOLN COMPARISON:  Chest CT 03/05/2020. CT  the abdomen and pelvis 05/01/2004. FINDINGS: CT CHEST FINDINGS Cardiovascular: Heart size is normal. Small amount of pericardial fluid and/or thickening, slightly increased compared to the prior study, but unlikely to be of hemodynamic significance at this time. Atherosclerotic calcifications in the thoracic aorta as well as the left main, left anterior descending, left circumflex and right coronary arteries. Mediastinum/Nodes: Multiple enlarged mediastinal and hilar lymph nodes bilaterally, bulkiest of which is in the low right paratracheal nodal station measuring 2.3 cm in short axis, low left paratracheal nodal station measuring 2.8 cm in short axis, and AP window measuring 1.9 cm in short axis. Hilar lymph nodes measure up to 2 cm in short axis. Esophagus is unremarkable in appearance. Esophagus is unremarkable in appearance. No axillary lymphadenopathy. Lungs/Pleura: Mass-like enlargement of the right infrahilar region best appreciated on axial image 40 of series 504 estimated to measure approximately 4.6 x 4.3 cm with postobstructive changes in the right middle lobe which appears to reflect a combination of consolidation and atelectasis. Multiple other pulmonary nodules are again noted throughout the lungs bilaterally, largest of which is in the right lower lobe measuring 2.5 x 2.1 cm (axial image 126 of series 505). Musculoskeletal: Enumerable lytic lesions throughout the visualized axial and appendicular skeleton, compatible with widespread metastatic disease to the bones. CT ABDOMEN PELVIS FINDINGS Hepatobiliary: 1.5 x 1.3 cm centrally low-attenuation lesion with some peripheral nodular enhancement and progressive centripetal filling in segment 3 of the liver, compatible with a cavernous hemangioma. No other suspicious hepatic lesions. No intra or extrahepatic biliary ductal dilatation. Gallbladder is normal in appearance. Pancreas: No pancreatic mass. No pancreatic ductal dilatation. No pancreatic or  peripancreatic fluid collections or inflammatory changes. Spleen: Unremarkable. Adrenals/Urinary Tract: Bilateral kidneys and adrenal glands are normal in appearance. No hydroureteronephrosis. Urinary bladder is normal in appearance. Stomach/Bowel: Normal appearance of the stomach. No pathologic dilatation of small bowel or colon. Several colonic diverticulae are noted, without surrounding inflammatory changes to suggest an acute diverticulitis at this time. The appendix is not confidently identified and may be surgically absent. Regardless, there are no inflammatory changes noted adjacent to the cecum to suggest the presence of an acute appendicitis at this time. Vascular/Lymphatic: Aortic atherosclerosis, without evidence of aneurysm or dissection noted in the abdominal or pelvic vasculature. No lymphadenopathy noted in the abdomen or pelvis. Reproductive: Prostate gland is enlarged and heterogeneous in appearance measuring 5.8 x 4.3 x 6.1 cm. Other: Small volume of ascites.  No pneumoperitoneum. Musculoskeletal: Numerous lytic osseous lesions are noted throughout the visualized axial and appendicular structures, highly concerning for widespread metastatic disease to the bones. IMPRESSION: 1. 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 The primary malignancy is not confidently identified on today's examination, however, given the  mass-like appearance of the right infrahilar region with the postobstructive changes in the right middle lobe, a primary bronchogenic carcinoma in the central right middle lobe is suspected. 2. Colonic diverticulosis without evidence of acute diverticulitis at this time. 3. 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 4. Severe prostatomegaly. 5. Aortic  atherosclerosis, in addition to left main and 3 vessel coronary artery disease. Assessment for potential risk factor modification, dietary therapy or pharmacologic therapy may be warranted, if clinically indicated. 6. Additional incidental findings, as above. Electronically Signed   By: Vinnie Langton M.D.   On: 03/09/2020 14:45   DG Chest Port 1 View  Result Date: 03/11/2020 CLINICAL DATA:  Dyspnea, former smoker, history of pneumonia EXAM: PORTABLE CHEST 1 VIEW COMPARISON:  03/09/2020 chest radiograph. FINDINGS: Stable cardiomediastinal silhouette with normal heart size. No pneumothorax. Stable mild blunting of the right costophrenic angle suggesting small pleural effusion. No left pleural effusion. Worsening masslike consolidation in right infrahilar and right lower lung. Worsening hazy opacities in the left lung. Nodular opacity again noted in the peripheral right lower lobe. IMPRESSION: 1. Worsening masslike consolidation in the right infrahilar and right lower lung, likely a combination of pulmonary neoplasm and worsening postobstructive pneumonia as described on recent chest CT from 03/09/2020. 2. Nodular opacity in the peripheral right lower lung compatible with known pulmonary nodule on recent chest CT. 3. Worsening hazy opacities in the left lung, also suspicious for pneumonia versus lymphangitic tumor. Electronically Signed   By: Ilona Sorrel M.D.   On: 03/11/2020 15:23   DG Chest Port 1 View  Result Date: 03/04/2020 CLINICAL DATA:  Pt presents to ED via POV with c/o cough, and SOB. Pt states when he coughs he has substernal CP. Upon arrival to ED pt's RA sats 85-87% on RA. Pt also c/o dyspnea on exertion. Pt placed on 2L via [place] at this time. EXAM: PORTABLE CHEST 1 VIEW COMPARISON:  Chest radiograph 03/28/2018 FINDINGS: Stable cardiomediastinal contours. The heart size. Lungs are hyperinflated. There are new infiltrates throughout the bilateral lower lungs concerning for infection. Background  chronic bronchitic change. No pneumothorax or significant pleural effusion. No acute finding in the visualized skeleton. IMPRESSION: New infiltrates throughout the bilateral lower lungs concerning for infection, less likely edema. Recommend radiographic follow-up to resolution. Electronically Signed   By: Audie Pinto M.D.   On: 03/04/2020 14:12   [place] CORE BIOPSY (SOFT TISSUE)  Result Date: 03/07/2020 INDICATION: 74 year old male with a history of pathologic supraclavicular adenopathy, likely metastatic lung cancer EXAM: ULTRASOUND-GUIDED BIOPSY RIGHT SUPRACLAVICULAR LYMPH NODE MEDICATIONS: None. ANESTHESIA/SEDATION: None. FLUOROSCOPY TIME:  None COMPLICATIONS: None PROCEDURE: Informed written consent was obtained from the patient after a thorough discussion of the procedural risks, benefits and alternatives. All questions were addressed. Maximal Sterile Barrier Technique was utilized including caps, mask, sterile gowns, sterile gloves, sterile drape, hand hygiene and skin antiseptic. A timeout was performed prior to the initiation of the procedure. Ultrasound survey was performed with images stored and sent to PACs. The neck was prepped with chlorhexidine in a sterile fashion, and a sterile drape was applied covering the operative field. A sterile gown and sterile gloves were used for the procedure. Local anesthesia was provided with 1% Lidocaine. Ultrasound guidance was used to infiltrate the region with 1% lidocaine for local anesthesia. Multiple separate 18 gauge core needle biopsy were then acquired of the right supraclavicular lymph node using ultrasound guidance. Images were stored. Final image was stored after biopsy. Patient tolerated the procedure  well and remained hemodynamically stable throughout. No complications were encountered and no significant blood loss was encounter IMPRESSION: Status post ultrasound-guided right supraclavicular lymph node biopsy. Signed, Dulcy Fanny. Dellia Nims, RPVI  [facility] Electronically Signed   By: Corrie Mckusick D.O.   On: 03/07/2020 08:42      ASSESSMENT/PLAN   Acute on chronic hypoxemic respiratory failure    -patient with severe centrilobular emphysema and Stage 4 lung adenocarcinoma   -goals of care discourse yesterday with patient and wife - currently no aggressive measures -decrease Solumedrol from 80 to 60 bid -d/c vancomycin due to negative MRSA pcr -continue zosyn for 5-7d CAP course -appreciate PALS team - patient on morphine -chest PT     Multiple co-morbid conditions  - defer to hospitalist team    Thank you for allowing me to participate in the care of this patient.   This document was prepared using Dragon voice recognition software and may include unintentional dictation errors.  Critical care provider statement:    Critical care time (minutes):  33   Critical care time was exclusive of:  Separately billable procedures and  treating other patients   Critical care was necessary to treat or prevent imminent or  life-threatening deterioration of the following conditions:  acute on chronic hypoxemic respiratory failure, lung cancer, emphysema, multiple comorbid conditions   Critical care was time spent personally by me on the following  activities:  Development of treatment plan with patient or surrogate,  discussions with consultants, evaluation of patient's response to  treatment, examination of patient, obtaining history from patient or  surrogate, ordering and performing treatments and interventions, ordering  and review of laboratory studies and re-evaluation of patient's condition   I assumed direction of critical care for this patient from another  provider in my specialty: no        Ottie Glazier, M.D.  Division of [place]

## 2020-03-12 NOTE — Progress Notes (Signed)
PROGRESS NOTE    Troy Shepherd  ERD:[id number] DOB: 1946/10/04 DOA: 03/09/2020 PCP: Theotis Burrow, MD       Assessment & Plan:   Active Problems:   Postobstructive pneumonia   Likely post-obstructive pneumonia: did not finish abx course at home as he thought the medications were sent to his "house." Pt was educated on the 2 different ways prescriptions are given at d/c. Pt verbalized his understanding. Continue on IV zosyn, azithromycin. Will continue on IV steroids. Will continue bronchodilators. Strep is neg. Legionella & mycoplasma pending. Encourage incentive spirometry. Continue on supplemental oxygen and wean as tolerated   Lung cancer: s/p biopsy & path showing metastatic poorly differentiated carcinoma, lung primary on last admission. CT scan shows innumerable lytic lesions on axial & appendicular skeleton   Acute on chronic hypoxic respiratory failure: likely secondary to above. Increased oxygen demand today, currently 6L [place] as per pt's nurse. Will likely need HFNC. Continue on supplemental oxygen and wean as tolerated. Will continue bronchodilators. Will continue on IV steroids   AKI: baseline Cr/GFR is unknown. Cr is trending up today. Avoid nephrotoxic meds   Hx of smoking: smoked 5 cigs a day x 30 years but previous documentation stated significantly more ppd. Quit smoking on 03/07/20 as per pt. Nicotine patch to help prevent w/drawal   Leukocytosis: likely secondary to infection. Continue on IV abxs  Hypokalemia: WNL today. Will continue to monitor   Elevated alkaline phosphatase: resolved  Transaminitis: resolved  HTN: uncontrolled. Will continue on home dose of amlodipine, metoprolol. Hydralazine prn  Generalized weakness: PT recs home health   DVT prophylaxis: lovenox Code Status: full  Family Communication: discussed w/ pt's wife, Troy Shepherd, and answered her questions Disposition Plan: PT recs home health but pt is still not stable for d/c  home secondary to respiratory failure    Consultants:    Procedures:    Antimicrobials: zosyn, azithromycin   Subjective: Pt c/o shortness of breath still   Objective: Vitals:   03/12/20 0401 03/12/20 0411 03/12/20 0500 03/12/20 0741  BP:  132/84 (!) 118/95   Pulse:    (!) 105  Resp:  (!) 25 14   Temp:  97.8 F (36.6 C)    TempSrc:  Oral    SpO2: 98% 97%  98%  Weight:      Height:        Intake/Output Summary (Last 24 hours) at 03/12/2020 0822 Last data filed at 03/12/2020 0500 Gross per 24 hour  Intake 2139.22 ml  Output 325 ml  Net 1814.22 ml   Filed Weights   03/09/20 1204  Weight: 65.8 kg    Examination:  General exam: Appears calm and comfortable  Respiratory system: course breath sounds b/l. Rhonchi b/l Cardiovascular system: S1 & S2 +. No , rubs, gallops or clicks.  Gastrointestinal system: Abdomen is nondistended, soft and nontender. Hypoactive bowel sounds heard. Central nervous system: Alert and oriented to person today. Moves all 4 extremities  Psychiatry: Judgement and insight appear abnormal. Flat mood and affect.     Data Reviewed: I have personally reviewed following labs and imaging studies  CBC: Recent Labs  Lab 03/07/20 0846 03/09/20 1230 03/10/20 0416 03/11/20 0747 03/12/20 0434  WBC 19.4* 17.0* 16.5* 14.8* 11.8*  NEUTROABS 15.7* 13.7*  --   --   --   HGB 11.9* 13.7 12.1* 11.7* 11.2*  HCT 36.2* 39.9 36.3* 35.6* 33.6*  MCV 97.6 94.8 96.8 98.1 96.8  PLT 290 323 295 300 301   Basic  Metabolic Panel: Recent Labs  Lab 03/06/20 0527 03/09/20 1230 03/10/20 0416 03/11/20 0747 03/12/20 0434  NA 137 138 137 136 135  K 4.1 3.2* 3.9 4.4 4.2  CL 103 101 104 104 96*  CO2 25 25 22 22 23   GLUCOSE 120* 88 86 70 256*  BUN 38* 32* 27* 31* 41*  CREATININE 1.20 1.19 1.11 1.25* 1.53*  CALCIUM 10.4* 11.0* 10.5* 11.8* 10.8*  MG  --   --   --  2.0 2.0  PHOS  --   --   --   --  4.4   GFR: Estimated Creatinine Clearance: 39.4 mL/min (A)  (by C-G formula based on SCr of 1.53 mg/dL (H)). Liver Function Tests: Recent Labs  Lab 03/09/20 1230 03/10/20 0416 03/11/20 0747  AST 75* 38 31  ALT 73* 47* 33  ALKPHOS 181* 132* 126  BILITOT 0.9 0.8 1.3*  PROT 7.2 6.3* 6.3*  ALBUMIN 2.4* 2.1* 2.2*   No results for input(s): LIPASE, AMYLASE in the last 168 hours. No results for input(s): AMMONIA in the last 168 hours. Coagulation Profile: Recent Labs  Lab 03/05/20 1551 03/09/20 1230  INR 1.2 1.4*   Cardiac Enzymes: No results for input(s): CKTOTAL, CKMB, CKMBINDEX, TROPONINI in the last 168 hours. BNP (last 3 results) No results for input(s): PROBNP in the last 8760 hours. HbA1C: No results for input(s): HGBA1C in the last 72 hours. CBG: Recent Labs  Lab 03/11/20 1316 03/11/20 1520  GLUCAP 73 74   Lipid Profile: No results for input(s): CHOL, HDL, LDLCALC, TRIG, CHOLHDL, LDLDIRECT in the last 72 hours. Thyroid Function Tests: No results for input(s): TSH, T4TOTAL, FREET4, T3FREE, THYROIDAB in the last 72 hours. Anemia Panel: No results for input(s): VITAMINB12, FOLATE, FERRITIN, TIBC, IRON, RETICCTPCT in the last 72 hours. Sepsis Labs: Recent Labs  Lab 03/09/20 1220 03/11/20 1653 03/11/20 1908 03/11/20 2232  LATICACIDVEN 1.7 3.0* 3.6* 3.0*    Recent Results (from the past 240 hour(s))  Blood Culture (routine x 2)     Status: None   Collection Time: 03/04/20 12:18 PM   Specimen: BLOOD  Result Value Ref Range Status   Specimen Description BLOOD RARM [facility]  Final   Special Requests   Final    BOTTLES DRAWN AEROBIC AND ANAEROBIC Blood Culture adequate volume   Culture   Final    NO GROWTH 5 DAYS Performed at [facility], [address], [place], [place] [address]    Report Status 03/09/2020 FINAL  Final  Blood Culture (routine x 2)     Status: None   Collection Time: 03/04/20  3:16 PM   Specimen: BLOOD  Result Value Ref Range Status   Specimen Description BLOOD L HAND  Final   Special  Requests   Final    BOTTLES DRAWN AEROBIC AND ANAEROBIC Blood Culture adequate volume   Culture   Final    NO GROWTH 5 DAYS Performed at [facility], [address] [place], [place] [address]    Report Status 03/09/2020 FINAL  Final  Respiratory Panel by RT PCR (Flu A&B, Covid) - Nasopharyngeal Swab     Status: None   Collection Time: 03/04/20  3:17 PM   Specimen: Nasopharyngeal Swab  Result Value Ref Range Status   SARS Coronavirus 2 by RT PCR NEGATIVE NEGATIVE Final    Comment: (NOTE) SARS-CoV-2 target nucleic acids are NOT DETECTED. The SARS-CoV-2 RNA is generally detectable in upper respiratoy specimens during the acute phase of infection. The lowest  concentration of SARS-CoV-2 viral copies this assay can detect is 131 copies/mL. A negative result does not preclude SARS-Cov-2 infection and should not be used as the sole basis for treatment or other patient management decisions. A negative result may occur with  improper specimen collection/handling, submission of specimen other than nasopharyngeal swab, presence of viral mutation(s) within the areas targeted by this assay, and inadequate number of viral copies (<131 copies/mL). A negative result must be combined with clinical observations, patient history, and epidemiological information. The expected result is Negative. Fact Sheet for Patients:  PinkCheek.be Fact Sheet for Healthcare Providers:  GravelBags.it This test is not yet ap proved or cleared by the [place] FDA and  has been authorized for detection and/or diagnosis of SARS-CoV-2 by FDA under an Emergency Use Authorization (EUA). This EUA will remain  in effect (meaning this test can be used) for the duration of the COVID-19 declaration under Section 564(b)(1) of the Act, 21 U.S.C. section 360bbb-3(b)(1), unless the authorization is terminated or revoked sooner.    Influenza A by PCR NEGATIVE  NEGATIVE Final   Influenza B by PCR NEGATIVE NEGATIVE Final    Comment: (NOTE) The Xpert Xpress SARS-CoV-2/FLU/RSV assay is intended as an aid in  the diagnosis of influenza from Nasopharyngeal swab specimens and  should not be used as a sole basis for treatment. Nasal washings and  aspirates are unacceptable for Xpert Xpress SARS-CoV-2/FLU/RSV  testing. Fact Sheet for Patients: PinkCheek.be Fact Sheet for Healthcare Providers: GravelBags.it This test is not yet approved or cleared by the [place] FDA and  has been authorized for detection and/or diagnosis of SARS-CoV-2 by  FDA under an Emergency Use Authorization (EUA). This EUA will remain  in effect (meaning this test can be used) for the duration of the  Covid-19 declaration under Section 564(b)(1) of the Act, 21  U.S.C. section 360bbb-3(b)(1), unless the authorization is  terminated or revoked. Performed at [facility], [place], [place] [address]   SARS CORONAVIRUS 2 (TAT 6-24 HRS) Nasopharyngeal Nasopharyngeal Swab     Status: None   Collection Time: 03/05/20  2:35 PM   Specimen: Nasopharyngeal Swab  Result Value Ref Range Status   SARS Coronavirus 2 NEGATIVE NEGATIVE Final    Comment: (NOTE) SARS-CoV-2 target nucleic acids are NOT DETECTED. The SARS-CoV-2 RNA is generally detectable in upper and lower respiratory specimens during the acute phase of infection. Negative results do not preclude SARS-CoV-2 infection, do not rule out co-infections with other pathogens, and should not be used as the sole basis for treatment or other patient management decisions. Negative results must be combined with clinical observations, patient history, and epidemiological information. The expected result is Negative. Fact Sheet for Patients: SugarRoll.be Fact Sheet for Healthcare  Providers: https://www.woods-mathews.com/ This test is not yet approved or cleared by the [place] FDA and  has been authorized for detection and/or diagnosis of SARS-CoV-2 by FDA under an Emergency Use Authorization (EUA). This EUA will remain  in effect (meaning this test can be used) for the duration of the COVID-19 declaration under Section 56 4(b)(1) of the Act, 21 U.S.C. section 360bbb-3(b)(1), unless the authorization is terminated or revoked sooner. Performed at [facility], [place] [address], [place], [place] [address]   Culture, blood (Routine x 2)     Status: None (Preliminary result)   Collection Time: 03/09/20 12:31 PM   Specimen: BLOOD  Result Value Ref Range Status   Specimen Description BLOOD BLOOD RIGHT FOREARM  Final   Special Requests   Final    BOTTLES DRAWN AEROBIC AND ANAEROBIC Blood Culture results may not be optimal due to an inadequate volume of blood received in culture bottles   Culture   Final    NO GROWTH 3 DAYS Performed at [facility], [address] [place], [place] [address]    Report Status PENDING  Incomplete  Culture, blood (Routine x 2)     Status: None (Preliminary result)   Collection Time: 03/09/20 12:40 PM   Specimen: BLOOD  Result Value Ref Range Status   Specimen Description BLOOD LEFT ANTECUBITAL  Final   Special Requests   Final    BOTTLES DRAWN AEROBIC AND ANAEROBIC Blood Culture results may not be optimal due to an excessive volume of blood received in culture bottles   Culture   Final    NO GROWTH 3 DAYS Performed at [facility], [address], [place], [place] [address]    Report Status PENDING  Incomplete  Respiratory Panel by RT PCR (Flu A&B, Covid) - Nasopharyngeal Swab     Status: None   Collection Time: 03/09/20  2:32 PM   Specimen: Nasopharyngeal Swab  Result Value Ref Range Status   SARS Coronavirus 2 by RT PCR NEGATIVE NEGATIVE Final    Comment: (NOTE) SARS-CoV-2 target  nucleic acids are NOT DETECTED. The SARS-CoV-2 RNA is generally detectable in upper respiratoy specimens during the acute phase of infection. The lowest concentration of SARS-CoV-2 viral copies this assay can detect is 131 copies/mL. A negative result does not preclude SARS-Cov-2 infection and should not be used as the sole basis for treatment or other patient management decisions. A negative result may occur with  improper specimen collection/handling, submission of specimen other than nasopharyngeal swab, presence of viral mutation(s) within the areas targeted by this assay, and inadequate number of viral copies (<131 copies/mL). A negative result must be combined with clinical observations, patient history, and epidemiological information. The expected result is Negative. Fact Sheet for Patients:  PinkCheek.be Fact Sheet for Healthcare Providers:  GravelBags.it This test is not yet ap proved or cleared by the [place] FDA and  has been authorized for detection and/or diagnosis of SARS-CoV-2 by FDA under an Emergency Use Authorization (EUA). This EUA will remain  in effect (meaning this test can be used) for the duration of the COVID-19 declaration under Section 564(b)(1) of the Act, 21 U.S.C. section 360bbb-3(b)(1), unless the authorization is terminated or revoked sooner.    Influenza A by PCR NEGATIVE NEGATIVE Final   Influenza B by PCR NEGATIVE NEGATIVE Final    Comment: (NOTE) The Xpert Xpress SARS-CoV-2/FLU/RSV assay is intended as an aid in  the diagnosis of influenza from Nasopharyngeal swab specimens and  should not be used as a sole basis for treatment. Nasal washings and  aspirates are unacceptable for Xpert Xpress SARS-CoV-2/FLU/RSV  testing. Fact Sheet for Patients: PinkCheek.be Fact Sheet for Healthcare Providers: GravelBags.it This test is not  yet approved or cleared by the [place] FDA and  has been authorized for detection and/or diagnosis of SARS-CoV-2 by  FDA under an Emergency Use Authorization (EUA). This EUA will remain  in effect (meaning this test can be used) for the duration of the  Covid-19 declaration under Section 564(b)(1) of the Act, 21  U.S.C. section 360bbb-3(b)(1), unless the authorization is  terminated or revoked. Performed at [facility], [address] [place], [place] [address]  Radiology Studies: DG Chest Port 1 View  Result Date: 03/11/2020 CLINICAL DATA:  Dyspnea, former smoker, history of pneumonia EXAM: PORTABLE CHEST 1 VIEW COMPARISON:  03/09/2020 chest radiograph. FINDINGS: Stable cardiomediastinal silhouette with normal heart size. No pneumothorax. Stable mild blunting of the right costophrenic angle suggesting small pleural effusion. No left pleural effusion. Worsening masslike consolidation in right infrahilar and right lower lung. Worsening hazy opacities in the left lung. Nodular opacity again noted in the peripheral right lower lobe. IMPRESSION: 1. Worsening masslike consolidation in the right infrahilar and right lower lung, likely a combination of pulmonary neoplasm and worsening postobstructive pneumonia as described on recent chest CT from 03/09/2020. 2. Nodular opacity in the peripheral right lower lung compatible with known pulmonary nodule on recent chest CT. 3. Worsening hazy opacities in the left lung, also suspicious for pneumonia versus lymphangitic tumor. Electronically Signed   By: Ilona Sorrel M.D.   On: 03/11/2020 15:23        Scheduled Meds: . benzonatate  200 mg Oral TID  . Chlorhexidine Gluconate Cloth  6 each Topical Daily  . enoxaparin (LOVENOX) injection  40 mg Subcutaneous Q24H  . ipratropium-albuterol  3 mL Nebulization Q4H  . methylPREDNISolone (SOLU-MEDROL) injection  80 mg Intravenous BID  . metoprolol tartrate  25 mg Oral BID  . sodium  chloride flush  3 mL Intravenous Q12H   Continuous Infusions: . sodium chloride Stopped (03/11/20 0920)  . amiodarone 30 mg/hr (03/12/20 0514)  . azithromycin Stopped (03/11/20 1027)  . piperacillin-tazobactam (ZOSYN)  IV 3.375 g (03/12/20 0512)  . sodium bicarbonate 150 mEq in dextrose 5% 1000 mL 100 mL/hr at 03/12/20 0500     LOS: 3 days    Time spent: 30 mins     Wyvonnia Dusky, MD [facility] Pager [phone]  If 7PM-7AM, please contact night-coverage [email or web address] 03/12/2020, 8:22 AM

## 2020-03-12 NOTE — Progress Notes (Signed)
Attempted elink video with family, spoke with family over the phone to assist with getting connected. Family never was able to join call perhaps due to technical issues.

## 2020-03-12 NOTE — Progress Notes (Signed)
OT Cancellation Note  Patient Details Name: Troy Shepherd MRN: [id number] DOB: February 10, 1946   Cancelled Treatment:    Reason Eval/Treat Not Completed: Medical issues which prohibited therapy  Pt t/f to ICU yesterday (03/11/20) d/t increased O2 needs/respiratory distress and Afib with RVR. OT will complete order at this time per protocol d/t change in status. Please re-consult if/as pt becomes more appropriate for therapy participation. Thank you.  Gerrianne Scale, [place], OTR/L ascom [phone] 03/12/20, 9:24 AM

## 2020-03-12 NOTE — Progress Notes (Signed)
*  PRELIMINARY RESULTS* Echocardiogram 2D Echocardiogram has been performed.  Troy Shepherd Troy Shepherd 03/12/2020, 12:04 PM

## 2020-03-12 NOTE — Telephone Encounter (Signed)
On 3/15- finally able to speak to pt's wife. Discussed with her- pt's diagnosis of lung cancer- in the context of his respiratory failure- extreme poor prognosis. Recommend family meeting. Wife states that she will be available between 12-1 pm on 3/16. Will plan to meet tomorrow with wife.

## 2020-03-12 NOTE — Progress Notes (Signed)
PT Cancellation Note  Patient Details Name: Troy Shepherd MRN: [id number] DOB: December 17, 1946   Cancelled Treatment:    Reason Eval/Treat Not Completed: Medical issues which prohibited therapy.  Pt t/f to ICU (03/11/20) d/t increased O2 needs/respiratory distress and Afib with RVR. OT will complete order at this time per protocol d/t change in status. Please re-consult if/as pt becomes more appropriate for therapy participation. Thank you   Jabes Primo H. Owens Shark, PT, DPT, NCS 03/12/20, 11:39 PM [phone]

## 2020-03-12 NOTE — Progress Notes (Addendum)
Call to [facility] to confirm whether patient is receiving Home Health through them. Spoke with [employer] [employer] who is following up.  12:30- Call from [facility] stating patient was still pending with [facility] services for RN and OT, he was scheduled for his first visit today but is now at the hospital. [facility] will continue to follow.  Oleh Genin, [place]

## 2020-03-13 DIAGNOSIS — J188 Other pneumonia, unspecified organism: Secondary | ICD-10-CM

## 2020-03-13 DIAGNOSIS — F17210 Nicotine dependence, cigarettes, uncomplicated: Secondary | ICD-10-CM

## 2020-03-13 DIAGNOSIS — C7951 Secondary malignant neoplasm of bone: Secondary | ICD-10-CM

## 2020-03-13 DIAGNOSIS — C342 Malignant neoplasm of middle lobe, bronchus or lung: Secondary | ICD-10-CM | POA: Diagnosis present

## 2020-03-13 DIAGNOSIS — J9620 Acute and chronic respiratory failure, unspecified whether with hypoxia or hypercapnia: Secondary | ICD-10-CM

## 2020-03-13 DIAGNOSIS — C642 Malignant neoplasm of left kidney, except renal pelvis: Secondary | ICD-10-CM

## 2020-03-13 DIAGNOSIS — C770 Secondary and unspecified malignant neoplasm of lymph nodes of head, face and neck: Secondary | ICD-10-CM

## 2020-03-13 DIAGNOSIS — C3490 Malignant neoplasm of unspecified part of unspecified bronchus or lung: Secondary | ICD-10-CM

## 2020-03-13 LAB — CBC
HCT: 29.7 % — ABNORMAL LOW (ref 39.0–52.0)
Hemoglobin: 10.5 g/dL — ABNORMAL LOW (ref 13.0–17.0)
MCH: 32.3 pg (ref 26.0–34.0)
MCHC: 35.4 g/dL (ref 30.0–36.0)
MCV: 91.4 fL (ref 80.0–100.0)
Platelets: 229 10*3/uL (ref 150–400)
RBC: 3.25 MIL/uL — ABNORMAL LOW (ref 4.22–5.81)
RDW: 13.2 % (ref 11.5–15.5)
WBC: 24.8 10*3/uL — ABNORMAL HIGH (ref 4.0–10.5)
nRBC: 0.1 % (ref 0.0–0.2)

## 2020-03-13 LAB — BASIC METABOLIC PANEL
Anion gap: 14 (ref 5–15)
BUN: 46 mg/dL — ABNORMAL HIGH (ref 8–23)
CO2: 31 mmol/L (ref 22–32)
Calcium: 10.3 mg/dL (ref 8.9–10.3)
Chloride: 94 mmol/L — ABNORMAL LOW (ref 98–111)
Creatinine, Ser: 1.81 mg/dL — ABNORMAL HIGH (ref 0.61–1.24)
GFR calc Af Amer: 42 mL/min — ABNORMAL LOW (ref >60)
GFR calc non Af Amer: 36 mL/min — ABNORMAL LOW (ref >60)
Glucose, Bld: 155 mg/dL — ABNORMAL HIGH (ref 70–99)
Potassium: 4 mmol/L (ref 3.5–5.1)
Sodium: 139 mmol/L (ref 135–145)

## 2020-03-13 LAB — MYCOPLASMA PNEUMONIAE ANTIBODY, IGM: Mycoplasma pneumo IgM: <770 U/mL (ref 0–769)

## 2020-03-13 LAB — LEGIONELLA PNEUMOPHILA SEROGP 1 UR AG: L. pneumophila Serogp 1 Ur Ag: NEGATIVE

## 2020-03-13 MED ORDER — MORPHINE SULFATE 2 MG/ML IJ SOLN
2.0000 mg | INTRAMUSCULAR | Status: DC | PRN
  Administered 2020-03-13 – 2020-03-14 (×7): 2 mg via INTRAVENOUS
  Administered 2020-03-14: 4 mg via INTRAVENOUS
  Filled 2020-03-13: qty 2
  Filled 2020-03-13 (×7): qty 1

## 2020-03-13 MED ORDER — LORAZEPAM 2 MG/ML IJ SOLN: 1.0000 mg | INTRAMUSCULAR | Status: DC | PRN

## 2020-03-13 NOTE — Consult Note (Signed)
[place]  [phone] [phone]   Name: Troy Shepherd Date: 03/13/2020 MRN: [id number]  DOB: 29-Jun-1946  Patient Care Team: Theotis Burrow, MD as PCP - General (Family Medicine)    REASON FOR CONSULTATION: Troy Shepherd is a 74 y.o. male with multiple medical problems including history of hypertension and remote EtOH use, who was recently hospitalized 03/04/2020 to 03/07/2020 with acute on chronic hypoxic respiratory failure secondary to presumed postobstructive pneumonia versus COPD exacerbation.  CT of the chest/abdomen/pelvis revealed numerous pulmonary nodules bilaterally, bilateral hilar and mediastinal lymphadenopathy, and innumerable lytic lesions.  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  Patient was readmitted the hospital on 03/09/2020 with acute on chronic hypoxic respiratory failure likely secondary to postobstructive pneumonia.  He was started on high flow oxygen in the ICU.  Palliative care was consulted to help address goals and manage ongoing symptoms.  SOCIAL HISTORY:     reports that he quit smoking 6 days ago. His smoking use included cigarettes. He has a 4.00 pack-year smoking history. He has never used smokeless tobacco. He reports current alcohol use of about 3.0 standard drinks of alcohol per week. He reports that he does not use drugs.  Patient is married and lives at home with his wife.  He has a son and daughter from another marriage.  ADVANCE DIRECTIVES:  Not on file  CODE STATUS: DNR  PAST MEDICAL HISTORY: Past Medical History:  Diagnosis Date  . Alcohol abuse    quit 09/2014  . Arthritis   . Hypertension   . Leukopenia    resolved when stopped drinking 09/2014  . Pneumonia     PAST SURGICAL HISTORY:  Past Surgical History:  Procedure Laterality Date  . APPENDECTOMY    .  COLONOSCOPY N/A 11/09/2015   Procedure: COLONOSCOPY;  Surgeon: Josefine Class, MD;  Location: [facility] ENDOSCOPY;  Service: Endoscopy;  Laterality: N/A;    HEMATOLOGY/ONCOLOGY HISTORY:  Oncology History   No history exists.    ALLERGIES:  has No Known Allergies.  MEDICATIONS:  Current Facility-Administered Medications  Medication Dose Route Frequency Provider Last Rate Last Admin  . 0.9 %  sodium chloride infusion   Intravenous PRN Wyvonnia Dusky, MD   Stopped at 03/11/20 0920  . acetaminophen (TYLENOL) tablet 650 mg  650 mg Oral Q6H PRN Sharion Settler, NP   650 mg at 03/11/20 0551  . azithromycin (ZITHROMAX) 500 mg in sodium chloride 0.9 % 250 mL IVPB  500 mg Intravenous Q24H Wyvonnia Dusky, MD   Stopped at 03/13/20 1116  . benzonatate (TESSALON) capsule 200 mg  200 mg Oral TID Wyvonnia Dusky, MD   200 mg at 03/13/20 0950  . bisacodyl (DULCOLAX) EC tablet 5 mg  5 mg Oral Daily PRN Wyvonnia Dusky, MD   5 mg at 03/11/20 0836  . budesonide (PULMICORT) nebulizer solution 0.5 mg  0.5 mg Nebulization BID Awilda Bill, NP   0.5 mg at 03/13/20 0750  . Chlorhexidine Gluconate Cloth 2 % PADS 6 each  6 each Topical Daily Wyvonnia Dusky, MD   6 each at 03/12/20 0957  . enoxaparin (LOVENOX) injection 40 mg  40 mg Subcutaneous Q24H Wyvonnia Dusky, MD   40 mg at 03/12/20 2035  . fentaNYL (SUBLIMAZE) injection 12.5-25 mcg  12.5-25 mcg Intravenous Q2H PRN Awilda Bill, NP   25 mcg at 03/13/20 1019  .  ipratropium-albuterol (DUONEB) 0.5-2.5 (3) MG/3ML nebulizer solution 3 mL  3 mL Nebulization Q4H Tyler Pita, MD   3 mL at 03/13/20 1124  . ipratropium-albuterol (DUONEB) 0.5-2.5 (3) MG/3ML nebulizer solution 3 mL  3 mL Nebulization Q6H PRN Awilda Bill, NP      . ketorolac (TORADOL) tablet 10 mg  10 mg Oral Q6H PRN Sharion Settler, NP   10 mg at 03/10/20 1823  . methylPREDNISolone sodium succinate (SOLU-MEDROL) 125 mg/2 mL injection 60 mg  60 mg Intravenous  Q6H Awilda Bill, NP   60 mg at 03/13/20 0903  . metoprolol tartrate (LOPRESSOR) injection 5 mg  5 mg Intravenous Q8H PRN Wyvonnia Dusky, MD   5 mg at 03/11/20 1341  . metoprolol tartrate (LOPRESSOR) tablet 25 mg  25 mg Oral BID Wyvonnia Dusky, MD   25 mg at 03/13/20 0950  . ondansetron (ZOFRAN) tablet 4 mg  4 mg Oral Q6H PRN Wyvonnia Dusky, MD       Or  . ondansetron [facility]) injection 4 mg  4 mg Intravenous Q6H PRN Wyvonnia Dusky, MD   4 mg at 03/12/20 2035  . oxyCODONE (Oxy IR/ROXICODONE) immediate release tablet 10 mg  10 mg Oral Q6H PRN Wyvonnia Dusky, MD   10 mg at 03/12/20 0957  . piperacillin-tazobactam (ZOSYN) IVPB 3.375 g  3.375 g Intravenous Q8H Wyvonnia Dusky, MD   Stopped at 03/13/20 1003  . sodium chloride flush (NS) 0.9 % injection 3 mL  3 mL Intravenous Q12H Wyvonnia Dusky, MD   3 mL at 03/13/20 1020    VITAL SIGNS: BP (!) 162/92   Pulse 74   Temp 97.9 F (36.6 C) (Oral)   Resp 18   Ht 5' 7"  (1.702 m)   Wt 140 lb 10.5 oz (63.8 kg)   SpO2 98%   BMI 22.03 kg/m  Filed Weights   03/09/20 1204 03/13/20 0500  Weight: 145 lb (65.8 kg) 140 lb 10.5 oz (63.8 kg)    Estimated body mass index is 22.03 kg/m as calculated from the following:   Height as of this encounter: 5' 7"  (1.702 m).   Weight as of this encounter: 140 lb 10.5 oz (63.8 kg).  LABS: CBC:    Component Value Date/Time   WBC 24.8 (H) 03/13/2020 0458   HGB 10.5 (L) 03/13/2020 0458   HCT 29.7 (L) 03/13/2020 0458   PLT 229 03/13/2020 0458   MCV 91.4 03/13/2020 0458   NEUTROABS 13.7 (H) 03/09/2020 1230   LYMPHSABS 1.5 03/09/2020 1230   MONOABS 1.3 (H) 03/09/2020 1230   EOSABS 0.1 03/09/2020 1230   BASOSABS 0.1 03/09/2020 1230   Comprehensive Metabolic Panel:    Component Value Date/Time   NA 139 03/13/2020 0458   K 4.0 03/13/2020 0458   CL 94 (L) 03/13/2020 0458   CO2 31 03/13/2020 0458   BUN 46 (H) 03/13/2020 0458   CREATININE 1.81 (H) 03/13/2020 0458    GLUCOSE 155 (H) 03/13/2020 0458   CALCIUM 10.3 03/13/2020 0458   AST 31 03/11/2020 0747   ALT 33 03/11/2020 0747   ALKPHOS 126 03/11/2020 0747   BILITOT 1.3 (H) 03/11/2020 0747   PROT 6.3 (L) 03/11/2020 0747   ALBUMIN 2.2 (L) 03/11/2020 0747    RADIOGRAPHIC STUDIES: DG Chest 2 View  Result Date: 03/09/2020 CLINICAL DATA:  Follow-up pneumonia. EXAM: CHEST - 2 VIEW COMPARISON:  03/04/2020 FINDINGS: UPPER limits normal heart size again noted. Bilateral airspace opacities are again  noted, with increased RIGHT middle lobe consolidation/atelectasis. A trace RIGHT pleural effusion is noted. No pneumothorax or acute bony abnormality. IMPRESSION: Bilateral airspace opacities again noted with increased RIGHT middle lobe consolidation/atelectasis. Electronically Signed   By: Margarette Canada M.D.   On: 03/09/2020 13:12   CT Chest W Contrast  Result Date: 03/09/2020 CLINICAL DATA:  74 year old male with history of pneumonia. Mid right back pain. Swelling in the right flank. EXAM: CT CHEST, ABDOMEN, AND PELVIS WITH CONTRAST TECHNIQUE: Multidetector CT imaging of the chest, abdomen and pelvis was performed following the standard protocol during bolus administration of intravenous contrast. CONTRAST:  87m OMNIPAQUE IOHEXOL 300 MG/ML  SOLN COMPARISON:  Chest CT 03/05/2020. CT the abdomen and pelvis 05/01/2004. FINDINGS: CT CHEST FINDINGS Cardiovascular: Heart size is normal. Small amount of pericardial fluid and/or thickening, slightly increased compared to the prior study, but unlikely to be of hemodynamic significance at this time. Atherosclerotic calcifications in the thoracic aorta as well as the left main, left anterior descending, left circumflex and right coronary arteries. Mediastinum/Nodes: Multiple enlarged mediastinal and hilar lymph nodes bilaterally, bulkiest of which is in the low right paratracheal nodal station measuring 2.3 cm in short axis, low left paratracheal nodal station measuring 2.8 cm in short  axis, and AP window measuring 1.9 cm in short axis. Hilar lymph nodes measure up to 2 cm in short axis. Esophagus is unremarkable in appearance. Esophagus is unremarkable in appearance. No axillary lymphadenopathy. Lungs/Pleura: Mass-like enlargement of the right infrahilar region best appreciated on axial image 40 of series 504 estimated to measure approximately 4.6 x 4.3 cm with postobstructive changes in the right middle lobe which appears to reflect a combination of consolidation and atelectasis. Multiple other pulmonary nodules are again noted throughout the lungs bilaterally, largest of which is in the right lower lobe measuring 2.5 x 2.1 cm (axial image 126 of series 505). Musculoskeletal: Enumerable lytic lesions throughout the visualized axial and appendicular skeleton, compatible with widespread metastatic disease to the bones. CT ABDOMEN PELVIS FINDINGS Hepatobiliary: 1.5 x 1.3 cm centrally low-attenuation lesion with some peripheral nodular enhancement and progressive centripetal filling in segment 3 of the liver, compatible with a cavernous hemangioma. No other suspicious hepatic lesions. No intra or extrahepatic biliary ductal dilatation. Gallbladder is normal in appearance. Pancreas: No pancreatic mass. No pancreatic ductal dilatation. No pancreatic or peripancreatic fluid collections or inflammatory changes. Spleen: Unremarkable. Adrenals/Urinary Tract: Bilateral kidneys and adrenal glands are normal in appearance. No hydroureteronephrosis. Urinary bladder is normal in appearance. Stomach/Bowel: Normal appearance of the stomach. No pathologic dilatation of small bowel or colon. Several colonic diverticulae are noted, without surrounding inflammatory changes to suggest an acute diverticulitis at this time. The appendix is not confidently identified and may be surgically absent. Regardless, there are no inflammatory changes noted adjacent to the cecum to suggest the presence of an acute appendicitis at  this time. Vascular/Lymphatic: Aortic atherosclerosis, without evidence of aneurysm or dissection noted in the abdominal or pelvic vasculature. No lymphadenopathy noted in the abdomen or pelvis. Reproductive: Prostate gland is enlarged and heterogeneous in appearance measuring 5.8 x 4.3 x 6.1 cm. Other: Small volume of ascites.  No pneumoperitoneum. Musculoskeletal: Numerous lytic osseous lesions are noted throughout the visualized axial and appendicular structures, highly concerning for widespread metastatic disease to the bones. IMPRESSION: 1. 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 The primary malignancy  is not confidently identified on today's examination, however, given the mass-like appearance of the right infrahilar region with the postobstructive changes in the right middle lobe, a primary bronchogenic carcinoma in the central right middle lobe is suspected. 2. Colonic diverticulosis without evidence of acute diverticulitis at this time. 3. 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 4. Severe prostatomegaly. 5. Aortic atherosclerosis, in addition to left main and 3 vessel coronary artery disease. Assessment for potential risk factor modification, dietary therapy or pharmacologic therapy may be warranted, if clinically indicated. 6. Additional incidental findings, as above. Electronically Signed   By: Vinnie Langton M.D.   On: 03/09/2020 14:45   CT ANGIO CHEST PE W OR WO CONTRAST  Result Date: 03/05/2020 CLINICAL DATA:  Shortness of breath bilateral pneumonia, elevated D-dimer EXAM: CT ANGIOGRAPHY CHEST WITH CONTRAST TECHNIQUE: Multidetector CT imaging of the chest was performed using the standard protocol during bolus administration of intravenous contrast. Multiplanar CT  image reconstructions and MIPs were obtained to evaluate the vascular anatomy. CONTRAST:  4m OMNIPAQUE IOHEXOL 350 MG/ML SOLN COMPARISON:  Chest x-ray of 03/04/2020 FINDINGS: Cardiovascular: Calcified and noncalcified plaque throughout the thoracic aorta. No signs of acute aortic process. Small pericardial effusion. Heart size is normal. No signs of acute pulmonary embolism. Mediastinum/Nodes: Bulky mediastinal adenopathy. (Image 39, series 4): 2.9 cm AP window lymph node. (Image 38, series 4): 2.4 cm right paratracheal lymph node. Lymph nodes are seen throughout all nodal stations in the chest including the bilateral hila. Right hilar nodal tissue approximately 1.7 cm (image 50, series 4) anterior mediastinal/prevascular lymph nodes with enlargement as well. High AP window lymph node just below pre-vascular lymph nodes (image 39, series 4) 1.8 cm. Esophagus is grossly normal. No signs of frank absolute ir E adenopathy. 17 mm left thoracic inlet lymph node and similarly sized right thoracic inlet lymph nodes. Ovoid lymph node without fatty hilum in the left axilla measuring 10 mm. Lungs/Pleura: Nodular opacities throughout the chest with a background of ground-glass attenuation and septal thickening, largest area measuring 2.3 x 2.1 cm in the right lower lobe this shows low-density. Consolidative changes and volume loss in the right middle lobe. Narrowing of central airways at the right hilum also in the left infrahilar region. Patchy opacities bilaterally throughout the chest with nodular features none as discrete as the area in the right lung base. Small right pleural effusion layers dependently. Upper Abdomen: No signs of acute finding in the upper abdomen. Musculoskeletal: Numerous areas of lytic change throughout the visualized skeleton, particularly the spine. These are too numerous to count with a focal area as an example on image 23 of series 4 measuring 11 mm. No signs of acute fracture. Review of the MIP  images confirms the above findings. IMPRESSION: 1. No evidence of acute pulmonary embolism. 2. Bulky mediastinal and bilateral hilar adenopathy with nodular opacities throughout the chest and numerous areas of lytic change throughout the visualized skeleton. Findings concerning for primary pulmonary neoplasm or lymphoproliferative disorder with diffuse involvement/metastatic disease and with superimposed infection in the chest. 3. There was some mild nodal enlargement on previous exams. This is much worse and findings of diffuse bony lytic changes are also a new finding. 4. Some airway narrowing at the hila bilaterally worse on the right with some mild bronchial wall thickening. Superimposed infection may be due to postobstructive changes. 5. Small right pleural effusion. 6. Small pericardial effusion. 7. Aortic atherosclerosis. Aortic Atherosclerosis (ICD10-I70.0). Electronically Signed   By: GCay Schillings  Wile M.D.   On: 03/05/2020 13:27   CT Abdomen Pelvis W Contrast  Result Date: 03/09/2020 CLINICAL DATA:  74 year old male with history of pneumonia. Mid right back pain. Swelling in the right flank. EXAM: CT CHEST, ABDOMEN, AND PELVIS WITH CONTRAST TECHNIQUE: Multidetector CT imaging of the chest, abdomen and pelvis was performed following the standard protocol during bolus administration of intravenous contrast. CONTRAST:  56m OMNIPAQUE IOHEXOL 300 MG/ML  SOLN COMPARISON:  Chest CT 03/05/2020. CT the abdomen and pelvis 05/01/2004. FINDINGS: CT CHEST FINDINGS Cardiovascular: Heart size is normal. Small amount of pericardial fluid and/or thickening, slightly increased compared to the prior study, but unlikely to be of hemodynamic significance at this time. Atherosclerotic calcifications in the thoracic aorta as well as the left main, left anterior descending, left circumflex and right coronary arteries. Mediastinum/Nodes: Multiple enlarged mediastinal and hilar lymph nodes bilaterally, bulkiest of which is in the  low right paratracheal nodal station measuring 2.3 cm in short axis, low left paratracheal nodal station measuring 2.8 cm in short axis, and AP window measuring 1.9 cm in short axis. Hilar lymph nodes measure up to 2 cm in short axis. Esophagus is unremarkable in appearance. Esophagus is unremarkable in appearance. No axillary lymphadenopathy. Lungs/Pleura: Mass-like enlargement of the right infrahilar region best appreciated on axial image 40 of series 504 estimated to measure approximately 4.6 x 4.3 cm with postobstructive changes in the right middle lobe which appears to reflect a combination of consolidation and atelectasis. Multiple other pulmonary nodules are again noted throughout the lungs bilaterally, largest of which is in the right lower lobe measuring 2.5 x 2.1 cm (axial image 126 of series 505). Musculoskeletal: Enumerable lytic lesions throughout the visualized axial and appendicular skeleton, compatible with widespread metastatic disease to the bones. CT ABDOMEN PELVIS FINDINGS Hepatobiliary: 1.5 x 1.3 cm centrally low-attenuation lesion with some peripheral nodular enhancement and progressive centripetal filling in segment 3 of the liver, compatible with a cavernous hemangioma. No other suspicious hepatic lesions. No intra or extrahepatic biliary ductal dilatation. Gallbladder is normal in appearance. Pancreas: No pancreatic mass. No pancreatic ductal dilatation. No pancreatic or peripancreatic fluid collections or inflammatory changes. Spleen: Unremarkable. Adrenals/Urinary Tract: Bilateral kidneys and adrenal glands are normal in appearance. No hydroureteronephrosis. Urinary bladder is normal in appearance. Stomach/Bowel: Normal appearance of the stomach. No pathologic dilatation of small bowel or colon. Several colonic diverticulae are noted, without surrounding inflammatory changes to suggest an acute diverticulitis at this time. The appendix is not confidently identified and may be surgically  absent. Regardless, there are no inflammatory changes noted adjacent to the cecum to suggest the presence of an acute appendicitis at this time. Vascular/Lymphatic: Aortic atherosclerosis, without evidence of aneurysm or dissection noted in the abdominal or pelvic vasculature. No lymphadenopathy noted in the abdomen or pelvis. Reproductive: Prostate gland is enlarged and heterogeneous in appearance measuring 5.8 x 4.3 x 6.1 cm. Other: Small volume of ascites.  No pneumoperitoneum. Musculoskeletal: Numerous lytic osseous lesions are noted throughout the visualized axial and appendicular structures, highly concerning for widespread metastatic disease to the bones. IMPRESSION: 1. 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 The primary malignancy is not confidently identified on today's examination, however, given the mass-like appearance of the right infrahilar region with the postobstructive changes in the right middle lobe, a primary bronchogenic carcinoma in the central right middle lobe is suspected.  2. Colonic diverticulosis without evidence of acute diverticulitis at this time. 3. 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 4. Severe prostatomegaly. 5. Aortic atherosclerosis, in addition to left main and 3 vessel coronary artery disease. Assessment for potential risk factor modification, dietary therapy or pharmacologic therapy may be warranted, if clinically indicated. 6. Additional incidental findings, as above. Electronically Signed   By: Vinnie Langton M.D.   On: 03/09/2020 14:45   DG Chest Port 1 View  Result Date: 03/11/2020 CLINICAL DATA:  Dyspnea, former smoker, history of pneumonia EXAM: PORTABLE CHEST 1 VIEW COMPARISON:  03/09/2020 chest radiograph. FINDINGS: Stable  cardiomediastinal silhouette with normal heart size. No pneumothorax. Stable mild blunting of the right costophrenic angle suggesting small pleural effusion. No left pleural effusion. Worsening masslike consolidation in right infrahilar and right lower lung. Worsening hazy opacities in the left lung. Nodular opacity again noted in the peripheral right lower lobe. IMPRESSION: 1. Worsening masslike consolidation in the right infrahilar and right lower lung, likely a combination of pulmonary neoplasm and worsening postobstructive pneumonia as described on recent chest CT from 03/09/2020. 2. Nodular opacity in the peripheral right lower lung compatible with known pulmonary nodule on recent chest CT. 3. Worsening hazy opacities in the left lung, also suspicious for pneumonia versus lymphangitic tumor. Electronically Signed   By: Ilona Sorrel M.D.   On: 03/11/2020 15:23   DG Chest Port 1 View  Result Date: 03/04/2020 CLINICAL DATA:  Pt presents to ED via POV with c/o cough, and SOB. Pt states when he coughs he has substernal CP. Upon arrival to ED pt's RA sats 85-87% on RA. Pt also c/o dyspnea on exertion. Pt placed on 2L via [place] at this time. EXAM: PORTABLE CHEST 1 VIEW COMPARISON:  Chest radiograph 03/28/2018 FINDINGS: Stable cardiomediastinal contours. The heart size. Lungs are hyperinflated. There are new infiltrates throughout the bilateral lower lungs concerning for infection. Background chronic bronchitic change. No pneumothorax or significant pleural effusion. No acute finding in the visualized skeleton. IMPRESSION: New infiltrates throughout the bilateral lower lungs concerning for infection, less likely edema. Recommend radiographic follow-up to resolution. Electronically Signed   By: Audie Pinto M.D.   On: 03/04/2020 14:12   ECHOCARDIOGRAM COMPLETE  Result Date: 03/12/2020    ECHOCARDIOGRAM REPORT   Patient Name:   Yotam Jupiter Date of Exam: 03/12/2020 Medical Rec #:  [id number]   Height:       67.0 in  Accession #:    [id number]  Weight:       145.0 lb Date of Birth:  1946-03-10  BSA:          1.764 m Patient Age:    75 years    BP:           152/98 mmHg Patient Gender: M           HR:           72 bpm. Exam Location:  [facility] Procedure: 2D Echo, Color Doppler and Cardiac Doppler Indications:     I31.3 Pericardial effusion  History:         Patient has prior history of Echocardiogram examinations. Risk                  Factors:Hypertension.  Sonographer:     Charmayne Sheer RDCS (AE) Referring Phys:  [id number] Tyler Pita Diagnosing Phys: Kathlyn Sacramento MD  Sonographer Comments: Suboptimal subcostal window. TDS due to inability of pt to stay in position on left  side. IMPRESSIONS  1. Left ventricular ejection fraction, by estimation, is 55 to 60%. The left ventricle has normal function. The left ventricle has no regional wall motion abnormalities. There is mild left ventricular hypertrophy. Left ventricular diastolic parameters are consistent with Grade I diastolic dysfunction (impaired relaxation).  2. Right ventricular systolic function is normal. The right ventricular size is normal. Tricuspid regurgitation signal is inadequate for assessing PA pressure.  3. The mitral valve is normal in structure. Mild mitral valve regurgitation. No evidence of mitral stenosis.  4. The aortic valve is normal in structure. Aortic valve regurgitation is not visualized. Mild aortic valve sclerosis is present, with no evidence of aortic valve stenosis.  5. The inferior vena cava is normal in size with <50% respiratory variability, suggesting right atrial pressure of 8 mmHg. FINDINGS  Left Ventricle: Left ventricular ejection fraction, by estimation, is 55 to 60%. The left ventricle has normal function. The left ventricle has no regional wall motion abnormalities. The left ventricular internal cavity size was normal in size. There is  mild left ventricular hypertrophy. Left ventricular diastolic parameters are consistent with Grade I  diastolic dysfunction (impaired relaxation). Right Ventricle: The right ventricular size is normal. No increase in right ventricular wall thickness. Right ventricular systolic function is normal. Tricuspid regurgitation signal is inadequate for assessing PA pressure. Left Atrium: Left atrial size was normal in size. Right Atrium: Right atrial size was normal in size. Pericardium: A small pericardial effusion is present. The pericardial effusion is circumferential. There is no evidence of cardiac tamponade. Mitral Valve: The mitral valve is normal in structure. Normal mobility of the mitral valve leaflets. Mild mitral valve regurgitation. No evidence of mitral valve stenosis. MV peak gradient, 7.0 mmHg. The mean mitral valve gradient is 2.0 mmHg. Tricuspid Valve: The tricuspid valve is normal in structure. Tricuspid valve regurgitation is not demonstrated. No evidence of tricuspid stenosis. Aortic Valve: The aortic valve is normal in structure. Aortic valve regurgitation is not visualized. Mild aortic valve sclerosis is present, with no evidence of aortic valve stenosis. Aortic valve mean gradient measures 2.0 mmHg. Aortic valve peak gradient measures 3.5 mmHg. Aortic valve area, by VTI measures 2.04 cm. Pulmonic Valve: The pulmonic valve was normal in structure. Pulmonic valve regurgitation is trivial. No evidence of pulmonic stenosis. Aorta: The aortic root is normal in size and structure. Venous: The inferior vena cava is normal in size with less than 50% respiratory variability, suggesting right atrial pressure of 8 mmHg. IAS/Shunts: No atrial level shunt detected by color flow Doppler.  LEFT VENTRICLE PLAX 2D LVIDd:         4.34 cm  Diastology LVIDs:         3.04 cm  LV e' lateral:   4.46 cm/s LV PW:         1.10 cm  LV E/e' lateral: 16.6 LV IVS:        0.94 cm  LV e' medial:    3.59 cm/s LVOT diam:     2.00 cm  LV E/e' medial:  20.7 LV SV:         36 LV SV Index:   20 LVOT Area:     3.14 cm  LEFT ATRIUM          Index LA diam:    3.50 cm 1.98 cm/m  AORTIC VALVE                   PULMONIC VALVE AV Area (Vmax):    2.30  cm    PV Vmax:       0.85 m/s AV Area (Vmean):   2.22 cm    PV Vmean:      59.500 cm/s AV Area (VTI):     2.04 cm    PV VTI:        0.148 m AV Vmax:           93.90 cm/s  PV Peak grad:  2.9 mmHg AV Vmean:          58.700 cm/s PV Mean grad:  2.0 mmHg AV VTI:            0.177 m AV Peak Grad:      3.5 mmHg AV Mean Grad:      2.0 mmHg LVOT Vmax:         68.70 cm/s LVOT Vmean:        41.500 cm/s LVOT VTI:          0.115 m LVOT/AV VTI ratio: 0.65  AORTA Ao Root diam: 3.20 cm MITRAL VALVE MV Area (PHT): 2.99 cm    SHUNTS MV Peak grad:  7.0 mmHg    Systemic VTI:  0.12 m MV Mean grad:  2.0 mmHg    Systemic Diam: 2.00 cm MV Vmax:       1.32 m/s MV Vmean:      56.1 cm/s MV Decel Time: 254 msec MV E velocity: 74.20 cm/s MV A velocity: 98.50 cm/s MV E/A ratio:  0.75 Kathlyn Sacramento MD Electronically signed by Kathlyn Sacramento MD Signature Date/Time: 03/12/2020/3:00:50 PM    Final    [place] CORE BIOPSY (SOFT TISSUE)  Result Date: 03/07/2020 INDICATION: 74 year old male with a history of pathologic supraclavicular adenopathy, likely metastatic lung cancer EXAM: ULTRASOUND-GUIDED BIOPSY RIGHT SUPRACLAVICULAR LYMPH NODE MEDICATIONS: None. ANESTHESIA/SEDATION: None. FLUOROSCOPY TIME:  None COMPLICATIONS: None PROCEDURE: Informed written consent was obtained from the patient after a thorough discussion of the procedural risks, benefits and alternatives. All questions were addressed. Maximal Sterile Barrier Technique was utilized including caps, mask, sterile gowns, sterile gloves, sterile drape, hand hygiene and skin antiseptic. A timeout was performed prior to the initiation of the procedure. Ultrasound survey was performed with images stored and sent to PACs. The neck was prepped with chlorhexidine in a sterile fashion, and a sterile drape was applied covering the operative field. A sterile gown and sterile gloves were used  for the procedure. Local anesthesia was provided with 1% Lidocaine. Ultrasound guidance was used to infiltrate the region with 1% lidocaine for local anesthesia. Multiple separate 18 gauge core needle biopsy were then acquired of the right supraclavicular lymph node using ultrasound guidance. Images were stored. Final image was stored after biopsy. Patient tolerated the procedure well and remained hemodynamically stable throughout. No complications were encountered and no significant blood loss was encounter IMPRESSION: Status post ultrasound-guided right supraclavicular lymph node biopsy. Signed, Dulcy Fanny. Dellia Nims, RPVI [facility] Radiology Electronically Signed   By: Corrie Mckusick D.O.   On: 03/07/2020 08:42    PERFORMANCE STATUS (ECOG) : 3 - Symptomatic, >50% confined to bed  Review of Systems Unless otherwise noted, a complete review of systems is negative.  Physical Exam General: Frail appearing Pulmonary: Unlabored on HF [place] Extremities: no edema, no joint deformities Skin: no rashes Neurological: Weakness but otherwise nonfocal  IMPRESSION: Dr. Rogue Bussing and I met with patient and wife in the ICU.  Together, we reviewed patient's current medical problems.  Patient is not felt to  be a candidate for cancer treatment.  Patient verbalized an understanding and told his wife "I am going to die."  Patient stated repeatedly that he was tired of being in the hospital and just wanted to go home for his end-of-life care.  Both patient and wife were in agreement with just focusing on comfort measures only and stopping other treatments.  Patient's wife did not feel that she could care for him at home, even with hospice involvement.  Ultimately, patient and wife were in agreement to transfer to the hospice home for end-of-life care when a bed is available.  In the interim, we will work on weaning him from [facility].  All questions answered to patient/family  satisfaction.  Emotional support was provided.  PLAN: -[facility] when a bed is available -TOC consult -Morphine/lorazepam as needed for pain, dyspnea, or anxiety -DNR/DNI  Case and plan discussed with Drs. Bennie Pierini  Time Total: 60 minutes  Visit consisted of counseling and education dealing with the complex and emotionally intense issues of symptom management and palliative care in the setting of serious and potentially life-threatening illness.Greater than 50%  of this time was spent counseling and coordinating care related to the above assessment and plan.  Signed by: Altha Harm, PhD, NP-C

## 2020-03-13 NOTE — Progress Notes (Signed)
New referral for [facility] hospice home received from Palliative NP [place]. TOC Meagan Hagwood aware. Writer met in the room with Troy Shepherd and his wife Troy Shepherd, to initiate education regarding hospice services with some basic understanding voiced. There was a concern regarding Troy Shepherd ability to come to the hospice home.  Writer was notified by Staff RN Lovena Le that patient has a grandson, Troy Shepherd, that is willing to help with consents and transportation for Troy Shepherd. Writer spoke again to Troy Shepherd, who gave permission for Troy Shepherd to be contacted. Writer then spoke with Troy Shepherd, who plans to come to see his grandfather this evening and is able to transport Troy Shepherd to the hospice home tomorrow and assist with further explanation of services/consents. Patient information sent to referral. Possible transfer  to the hospice home tomorrow, pending full understanding by both patient and his wife of plan of care and ability for consent to be completed. Will continue to follow and update hospital care team. Thank you for the opportunity to be involved in the care of this patient and his family. Flo Shanks BSN, RN, [place] [phone]

## 2020-03-13 NOTE — Progress Notes (Signed)
Patient's wife gave the phone number for patient's ex-wife, Kalman Shan, and asked that we reach out to her with an update.  I called Rose at [phone] and updated her regarding the decision to focus on comfort and transition to hospice care.  Rose states that patient is estranged from his daughter.  She plans to have patient's son and other family come to the hospital tomorrow to visit and facilitate patient's goal of comfort/hospice.

## 2020-03-13 NOTE — Progress Notes (Signed)
PROGRESS NOTE    Troy Shepherd  BZJ:[id number] DOB: 1946/04/04 DOA: 03/09/2020 PCP: Theotis Burrow, MD       Assessment & Plan:   Active Problems:   Postobstructive pneumonia   Likely post-obstructive pneumonia:  Abxs & steroids d/c as pt's wife decided to proceed w/ home w/ hospice. Will continue bronchodilators. Encourage incentive spirometry. Continue on supplemental oxygen and wean as tolerated   Lung cancer: s/p biopsy & path showing metastatic poorly differentiated carcinoma, lung primary on last admission. CT scan shows innumerable lytic lesions on axial & appendicular skeleton   Acute on chronic hypoxic respiratory failure: likely secondary to above. Continue on HFNC. Continue on supplemental oxygen and wean as tolerated. Will continue bronchodilators  AKI: baseline Cr/GFR is unknown. Cr is trending up today again. Avoid nephrotoxic meds   Hx of smoking: smoked 5 cigs a day x 30 years but previous documentation stated significantly more ppd. Quit smoking on 03/07/20 as per pt. Nicotine patch to help prevent w/drawal   Leukocytosis: likely secondary to infection.  Hypokalemia: WNL today. Will continue to monitor   Elevated alkaline phosphatase: resolved  Transaminitis: resolved  HTN: uncontrolled. Will continue on home dose of amlodipine, metoprolol. Hydralazine prn  Generalized weakness: PT recs home health   DVT prophylaxis: lovenox Code Status: full  Family Communication:  Disposition Plan:  Will likely d/c home tomorrow w/ home hospice once pt is weaned down to 15L    Consultants:    Procedures:    Antimicrobials:    Subjective: Pt c/o shortness of breath  & cough  Objective: Vitals:   03/13/20 0700 03/13/20 0730 03/13/20 0750 03/13/20 0800  BP: (!) 161/101   (!) 156/85  Pulse: 76 80  77  Resp: 14 (!) 27  14  Temp:      TempSrc:      SpO2: 99% (!) 75% 98% 97%  Weight:      Height:        Intake/Output Summary (Last 24  hours) at 03/13/2020 0851 Last data filed at 03/13/2020 0343 Gross per 24 hour  Intake 2157.63 ml  Output 201 ml  Net 1956.63 ml   Filed Weights   03/09/20 1204 03/13/20 0500  Weight: 65.8 kg 63.8 kg    Examination:  General exam: Appears calm and comfortable  Respiratory system: diminished breath sounds b/l Cardiovascular system: S1 & S2 +. No , rubs, gallops or clicks.  Gastrointestinal system: Abdomen is nondistended, soft and nontender. Hypoactive bowel sounds heard. Central nervous system: Alert and oriented to person, place. Moves all 4 extremities  Psychiatry: Judgement and insight appear abnormal. Flat mood and affect.     Data Reviewed: I have personally reviewed following labs and imaging studies  CBC: Recent Labs  Lab 03/07/20 0846 03/07/20 0846 03/09/20 1230 03/10/20 0416 03/11/20 0747 03/12/20 0434 03/13/20 0458  WBC 19.4*   < > 17.0* 16.5* 14.8* 11.8* 24.8*  NEUTROABS 15.7*  --  13.7*  --   --   --   --   HGB 11.9*   < > 13.7 12.1* 11.7* 11.2* 10.5*  HCT 36.2*   < > 39.9 36.3* 35.6* 33.6* 29.7*  MCV 97.6   < > 94.8 96.8 98.1 96.8 91.4  PLT 290   < > 323 295 300 301 229   < > = values in this interval not displayed.   Basic Metabolic Panel: Recent Labs  Lab 03/09/20 1230 03/10/20 0416 03/11/20 0747 03/12/20 0434 03/13/20 0458  NA 138 137  136 135 139  K 3.2* 3.9 4.4 4.2 4.0  CL 101 104 104 96* 94*  CO2 25 22 22 23 31   GLUCOSE 88 86 70 256* 155*  BUN 32* 27* 31* 41* 46*  CREATININE 1.19 1.11 1.25* 1.53* 1.81*  CALCIUM 11.0* 10.5* 11.8* 10.8* 10.3  MG  --   --  2.0 2.0  --   PHOS  --   --   --  4.4  --    GFR: Estimated Creatinine Clearance: 32.3 mL/min (A) (by C-G formula based on SCr of 1.81 mg/dL (H)). Liver Function Tests: Recent Labs  Lab 03/09/20 1230 03/10/20 0416 03/11/20 0747  AST 75* 38 31  ALT 73* 47* 33  ALKPHOS 181* 132* 126  BILITOT 0.9 0.8 1.3*  PROT 7.2 6.3* 6.3*  ALBUMIN 2.4* 2.1* 2.2*   No results for input(s):  LIPASE, AMYLASE in the last 168 hours. No results for input(s): AMMONIA in the last 168 hours. Coagulation Profile: Recent Labs  Lab 03/09/20 1230  INR 1.4*   Cardiac Enzymes: No results for input(s): CKTOTAL, CKMB, CKMBINDEX, TROPONINI in the last 168 hours. BNP (last 3 results) No results for input(s): PROBNP in the last 8760 hours. HbA1C: No results for input(s): HGBA1C in the last 72 hours. CBG: Recent Labs  Lab 03/11/20 1316 03/11/20 1520  GLUCAP 73 74   Lipid Profile: No results for input(s): CHOL, HDL, LDLCALC, TRIG, CHOLHDL, LDLDIRECT in the last 72 hours. Thyroid Function Tests: No results for input(s): TSH, T4TOTAL, FREET4, T3FREE, THYROIDAB in the last 72 hours. Anemia Panel: No results for input(s): VITAMINB12, FOLATE, FERRITIN, TIBC, IRON, RETICCTPCT in the last 72 hours. Sepsis Labs: Recent Labs  Lab 03/09/20 1220 03/11/20 1653 03/11/20 1908 03/11/20 2232  LATICACIDVEN 1.7 3.0* 3.6* 3.0*    Recent Results (from the past 240 hour(s))  Blood Culture (routine x 2)     Status: None   Collection Time: 03/04/20 12:18 PM   Specimen: BLOOD  Result Value Ref Range Status   Specimen Description BLOOD RARM [facility]  Final   Special Requests   Final    BOTTLES DRAWN AEROBIC AND ANAEROBIC Blood Culture adequate volume   Culture   Final    NO GROWTH 5 DAYS Performed at [facility], [address] [place], [place] [address]    Report Status 03/09/2020 FINAL  Final  Blood Culture (routine x 2)     Status: None   Collection Time: 03/04/20  3:16 PM   Specimen: BLOOD  Result Value Ref Range Status   Specimen Description BLOOD L HAND  Final   Special Requests   Final    BOTTLES DRAWN AEROBIC AND ANAEROBIC Blood Culture adequate volume   Culture   Final    NO GROWTH 5 DAYS Performed at [facility], [address] [place], [place] [address]    Report Status 03/09/2020 FINAL  Final  Respiratory Panel by RT PCR (Flu A&B, Covid) - Nasopharyngeal  Swab     Status: None   Collection Time: 03/04/20  3:17 PM   Specimen: Nasopharyngeal Swab  Result Value Ref Range Status   SARS Coronavirus 2 by RT PCR NEGATIVE NEGATIVE Final    Comment: (NOTE) SARS-CoV-2 target nucleic acids are NOT DETECTED. The SARS-CoV-2 RNA is generally detectable in upper respiratoy specimens during the acute phase of infection. The lowest concentration of SARS-CoV-2 viral copies this assay can detect is 131 copies/mL. A negative result does not preclude SARS-Cov-2 infection and should not  be used as the sole basis for treatment or other patient management decisions. A negative result may occur with  improper specimen collection/handling, submission of specimen other than nasopharyngeal swab, presence of viral mutation(s) within the areas targeted by this assay, and inadequate number of viral copies (<131 copies/mL). A negative result must be combined with clinical observations, patient history, and epidemiological information. The expected result is Negative. Fact Sheet for Patients:  PinkCheek.be Fact Sheet for Healthcare Providers:  GravelBags.it This test is not yet ap proved or cleared by the [place] FDA and  has been authorized for detection and/or diagnosis of SARS-CoV-2 by FDA under an Emergency Use Authorization (EUA). This EUA will remain  in effect (meaning this test can be used) for the duration of the COVID-19 declaration under Section 564(b)(1) of the Act, 21 U.S.C. section 360bbb-3(b)(1), unless the authorization is terminated or revoked sooner.    Influenza A by PCR NEGATIVE NEGATIVE Final   Influenza B by PCR NEGATIVE NEGATIVE Final    Comment: (NOTE) The Xpert Xpress SARS-CoV-2/FLU/RSV assay is intended as an aid in  the diagnosis of influenza from Nasopharyngeal swab specimens and  should not be used as a sole basis for treatment. Nasal washings and  aspirates are  unacceptable for Xpert Xpress SARS-CoV-2/FLU/RSV  testing. Fact Sheet for Patients: PinkCheek.be Fact Sheet for Healthcare Providers: GravelBags.it This test is not yet approved or cleared by the [place] FDA and  has been authorized for detection and/or diagnosis of SARS-CoV-2 by  FDA under an Emergency Use Authorization (EUA). This EUA will remain  in effect (meaning this test can be used) for the duration of the  Covid-19 declaration under Section 564(b)(1) of the Act, 21  U.S.C. section 360bbb-3(b)(1), unless the authorization is  terminated or revoked. Performed at [facility], [place], [place] [address]   SARS CORONAVIRUS 2 (TAT 6-24 HRS) Nasopharyngeal Nasopharyngeal Swab     Status: None   Collection Time: 03/05/20  2:35 PM   Specimen: Nasopharyngeal Swab  Result Value Ref Range Status   SARS Coronavirus 2 NEGATIVE NEGATIVE Final    Comment: (NOTE) SARS-CoV-2 target nucleic acids are NOT DETECTED. The SARS-CoV-2 RNA is generally detectable in upper and lower respiratory specimens during the acute phase of infection. Negative results do not preclude SARS-CoV-2 infection, do not rule out co-infections with other pathogens, and should not be used as the sole basis for treatment or other patient management decisions. Negative results must be combined with clinical observations, patient history, and epidemiological information. The expected result is Negative. Fact Sheet for Patients: SugarRoll.be Fact Sheet for Healthcare Providers: https://www.woods-mathews.com/ This test is not yet approved or cleared by the [place] FDA and  has been authorized for detection and/or diagnosis of SARS-CoV-2 by FDA under an Emergency Use Authorization (EUA). This EUA will remain  in effect (meaning this test can be used) for the duration of the COVID-19  declaration under Section 56 4(b)(1) of the Act, 21 U.S.C. section 360bbb-3(b)(1), unless the authorization is terminated or revoked sooner. Performed at [facility], [place] [address], [place], [place] [address]   Culture, blood (Routine x 2)     Status: None (Preliminary result)   Collection Time: 03/09/20 12:31 PM   Specimen: BLOOD  Result Value Ref Range Status   Specimen Description BLOOD BLOOD RIGHT FOREARM  Final   Special Requests   Final    BOTTLES DRAWN AEROBIC AND ANAEROBIC Blood Culture results may not be  optimal due to an inadequate volume of blood received in culture bottles   Culture   Final    NO GROWTH 4 DAYS Performed at [facility], [address] [place], [place] [address]    Report Status PENDING  Incomplete  Culture, blood (Routine x 2)     Status: None (Preliminary result)   Collection Time: 03/09/20 12:40 PM   Specimen: BLOOD  Result Value Ref Range Status   Specimen Description BLOOD LEFT ANTECUBITAL  Final   Special Requests   Final    BOTTLES DRAWN AEROBIC AND ANAEROBIC Blood Culture results may not be optimal due to an excessive volume of blood received in culture bottles   Culture   Final    NO GROWTH 4 DAYS Performed at [facility], [address], [place], [place] [address]    Report Status PENDING  Incomplete  Respiratory Panel by RT PCR (Flu A&B, Covid) - Nasopharyngeal Swab     Status: None   Collection Time: 03/09/20  2:32 PM   Specimen: Nasopharyngeal Swab  Result Value Ref Range Status   SARS Coronavirus 2 by RT PCR NEGATIVE NEGATIVE Final    Comment: (NOTE) SARS-CoV-2 target nucleic acids are NOT DETECTED. The SARS-CoV-2 RNA is generally detectable in upper respiratoy specimens during the acute phase of infection. The lowest concentration of SARS-CoV-2 viral copies this assay can detect is 131 copies/mL. A negative result does not preclude SARS-Cov-2 infection and should not be used as the sole basis for  treatment or other patient management decisions. A negative result may occur with  improper specimen collection/handling, submission of specimen other than nasopharyngeal swab, presence of viral mutation(s) within the areas targeted by this assay, and inadequate number of viral copies (<131 copies/mL). A negative result must be combined with clinical observations, patient history, and epidemiological information. The expected result is Negative. Fact Sheet for Patients:  PinkCheek.be Fact Sheet for Healthcare Providers:  GravelBags.it This test is not yet ap proved or cleared by the [place] FDA and  has been authorized for detection and/or diagnosis of SARS-CoV-2 by FDA under an Emergency Use Authorization (EUA). This EUA will remain  in effect (meaning this test can be used) for the duration of the COVID-19 declaration under Section 564(b)(1) of the Act, 21 U.S.C. section 360bbb-3(b)(1), unless the authorization is terminated or revoked sooner.    Influenza A by PCR NEGATIVE NEGATIVE Final   Influenza B by PCR NEGATIVE NEGATIVE Final    Comment: (NOTE) The Xpert Xpress SARS-CoV-2/FLU/RSV assay is intended as an aid in  the diagnosis of influenza from Nasopharyngeal swab specimens and  should not be used as a sole basis for treatment. Nasal washings and  aspirates are unacceptable for Xpert Xpress SARS-CoV-2/FLU/RSV  testing. Fact Sheet for Patients: PinkCheek.be Fact Sheet for Healthcare Providers: GravelBags.it This test is not yet approved or cleared by the [place] FDA and  has been authorized for detection and/or diagnosis of SARS-CoV-2 by  FDA under an Emergency Use Authorization (EUA). This EUA will remain  in effect (meaning this test can be used) for the duration of the  Covid-19 declaration under Section 564(b)(1) of the Act, 21  U.S.C. section  360bbb-3(b)(1), unless the authorization is  terminated or revoked. Performed at [facility], [address] [place], [place] [address]          Radiology Studies: [facility] Chest [place] 1 View  Result Date: 03/11/2020 CLINICAL DATA:  Dyspnea, former smoker, history of pneumonia  EXAM: PORTABLE CHEST 1 VIEW COMPARISON:  03/09/2020 chest radiograph. FINDINGS: Stable cardiomediastinal silhouette with normal heart size. No pneumothorax. Stable mild blunting of the right costophrenic angle suggesting small pleural effusion. No left pleural effusion. Worsening masslike consolidation in right infrahilar and right lower lung. Worsening hazy opacities in the left lung. Nodular opacity again noted in the peripheral right lower lobe. IMPRESSION: 1. Worsening masslike consolidation in the right infrahilar and right lower lung, likely a combination of pulmonary neoplasm and worsening postobstructive pneumonia as described on recent chest CT from 03/09/2020. 2. Nodular opacity in the peripheral right lower lung compatible with known pulmonary nodule on recent chest CT. 3. Worsening hazy opacities in the left lung, also suspicious for pneumonia versus lymphangitic tumor. Electronically Signed   By: Ilona Sorrel M.D.   On: 03/11/2020 15:23   ECHOCARDIOGRAM COMPLETE  Result Date: 03/12/2020    ECHOCARDIOGRAM REPORT   Patient Name:   Amiir Ragsdale Date of Exam: 03/12/2020 Medical Rec #:  [id number]   Height:       67.0 in Accession #:    [id number]  Weight:       145.0 lb Date of Birth:  13-May-1946  BSA:          1.764 m Patient Age:    74 years    BP:           152/98 mmHg Patient Gender: M           HR:           72 bpm. Exam Location:  [facility] Procedure: 2D Echo, Color Doppler and Cardiac Doppler Indications:     I31.3 Pericardial effusion  History:         Patient has prior history of Echocardiogram examinations. Risk                  Factors:Hypertension.  Sonographer:     Charmayne Sheer RDCS (AE) Referring Phys:   [id number] Diagnosing Phys: Kathlyn Sacramento MD  Sonographer Comments: Suboptimal subcostal window. TDS due to inability of pt to stay in position on left side. IMPRESSIONS  1. Left ventricular ejection fraction, by estimation, is 55 to 60%. The left ventricle has normal function. The left ventricle has no regional wall motion abnormalities. There is mild left ventricular hypertrophy. Left ventricular diastolic parameters are consistent with Grade I diastolic dysfunction (impaired relaxation).  2. Right ventricular systolic function is normal. The right ventricular size is normal. Tricuspid regurgitation signal is inadequate for assessing PA pressure.  3. The mitral valve is normal in structure. Mild mitral valve regurgitation. No evidence of mitral stenosis.  4. The aortic valve is normal in structure. Aortic valve regurgitation is not visualized. Mild aortic valve sclerosis is present, with no evidence of aortic valve stenosis.  5. The inferior vena cava is normal in size with <50% respiratory variability, suggesting right atrial pressure of 8 mmHg. FINDINGS  Left Ventricle: Left ventricular ejection fraction, by estimation, is 55 to 60%. The left ventricle has normal function. The left ventricle has no regional wall motion abnormalities. The left ventricular internal cavity size was normal in size. There is  mild left ventricular hypertrophy. Left ventricular diastolic parameters are consistent with Grade I diastolic dysfunction (impaired relaxation). Right Ventricle: The right ventricular size is normal. No increase in right ventricular wall thickness. Right ventricular systolic function is normal. Tricuspid regurgitation signal is inadequate for assessing PA pressure. Left Atrium: Left atrial size was normal in size. Right Atrium:  Right atrial size was normal in size. Pericardium: A small pericardial effusion is present. The pericardial effusion is circumferential. There is no evidence of cardiac  tamponade. Mitral Valve: The mitral valve is normal in structure. Normal mobility of the mitral valve leaflets. Mild mitral valve regurgitation. No evidence of mitral valve stenosis. MV peak gradient, 7.0 mmHg. The mean mitral valve gradient is 2.0 mmHg. Tricuspid Valve: The tricuspid valve is normal in structure. Tricuspid valve regurgitation is not demonstrated. No evidence of tricuspid stenosis. Aortic Valve: The aortic valve is normal in structure. Aortic valve regurgitation is not visualized. Mild aortic valve sclerosis is present, with no evidence of aortic valve stenosis. Aortic valve mean gradient measures 2.0 mmHg. Aortic valve peak gradient measures 3.5 mmHg. Aortic valve area, by VTI measures 2.04 cm. Pulmonic Valve: The pulmonic valve was normal in structure. Pulmonic valve regurgitation is trivial. No evidence of pulmonic stenosis. Aorta: The aortic root is normal in size and structure. Venous: The inferior vena cava is normal in size with less than 50% respiratory variability, suggesting right atrial pressure of 8 mmHg. IAS/Shunts: No atrial level shunt detected by color flow Doppler.  LEFT VENTRICLE PLAX 2D LVIDd:         4.34 cm  Diastology LVIDs:         3.04 cm  LV e' lateral:   4.46 cm/s LV PW:         1.10 cm  LV E/e' lateral: 16.6 LV IVS:        0.94 cm  LV e' medial:    3.59 cm/s LVOT diam:     2.00 cm  LV E/e' medial:  20.7 LV SV:         36 LV SV Index:   20 LVOT Area:     3.14 cm  LEFT ATRIUM         Index LA diam:    3.50 cm 1.98 cm/m  AORTIC VALVE                   PULMONIC VALVE AV Area (Vmax):    2.30 cm    PV Vmax:       0.85 m/s AV Area (Vmean):   2.22 cm    PV Vmean:      59.500 cm/s AV Area (VTI):     2.04 cm    PV VTI:        0.148 m AV Vmax:           93.90 cm/s  PV Peak grad:  2.9 mmHg AV Vmean:          58.700 cm/s PV Mean grad:  2.0 mmHg AV VTI:            0.177 m AV Peak Grad:      3.5 mmHg AV Mean Grad:      2.0 mmHg LVOT Vmax:         68.70 cm/s LVOT Vmean:         41.500 cm/s LVOT VTI:          0.115 m LVOT/AV VTI ratio: 0.65  AORTA Ao Root diam: 3.20 cm MITRAL VALVE MV Area (PHT): 2.99 cm    SHUNTS MV Peak grad:  7.0 mmHg    Systemic VTI:  0.12 m MV Mean grad:  2.0 mmHg    Systemic Diam: 2.00 cm MV Vmax:       1.32 m/s MV Vmean:      56.1 cm/s MV Decel Time:  254 msec MV E velocity: 74.20 cm/s MV A velocity: 98.50 cm/s MV E/A ratio:  0.75 Kathlyn Sacramento MD Electronically signed by Kathlyn Sacramento MD Signature Date/Time: 03/12/2020/3:00:50 PM    Final         Scheduled Meds: . benzonatate  200 mg Oral TID  . budesonide (PULMICORT) nebulizer solution  0.5 mg Nebulization BID  . Chlorhexidine Gluconate Cloth  6 each Topical Daily  . enoxaparin (LOVENOX) injection  40 mg Subcutaneous Q24H  . ipratropium-albuterol  3 mL Nebulization Q4H  . methylPREDNISolone (SOLU-MEDROL) injection  60 mg Intravenous Q6H  . metoprolol tartrate  25 mg Oral BID  . sodium chloride flush  3 mL Intravenous Q12H   Continuous Infusions: . sodium chloride Stopped (03/11/20 0920)  . azithromycin 500 mg (03/12/20 1312)  . piperacillin-tazobactam (ZOSYN)  IV 3.375 g (03/13/20 0602)     LOS: 4 days    Time spent: 30 mins     Wyvonnia Dusky, MD [facility] Pager [phone]  If 7PM-7AM, please contact night-coverage [email or web address] 03/13/2020, 8:51 AM

## 2020-03-13 NOTE — Assessment & Plan Note (Signed)
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  #Longstanding smoker  Recommendations:   #Patient has new diagnosis of metastatic lung cancer along with rib lesions-which unfortunately unfortunately complicates patient's respiratory illness-advanced COPD/postobstructive pneumonia.  I do long discussion with patient and his wife regarding incurable nature of his lung cancer.  Patient likely to die of his progressive malignancy.  Given his tenuous respiratory status/poor performance status-not a candidate for any systemic therapy.  I would recommend hospice.  #Patient/wife seem to understand the gravity of the illness.  Patient/wife agreed with hospice.  Patient wanted to go home with hospice; however wife felt she was unable to take care of him at home.  Introduced palliative care services; [employer].  Josh had a long discussion the patient regarding pros and cons of each option.  No decisions made.  # DNR/DNI.   Thank you Dr.Williams for allowing me to participate in the care of your pleasant patient. Please do not hesitate to contact me with questions or concerns in the interim.

## 2020-03-13 NOTE — Progress Notes (Signed)
Pulmonary Medicine          Date: 03/13/2020,   MRN# [id number] Troy Shepherd 1946/10/17     AdmissionWeight: 65.8 kg                 CurrentWeight: 63.8 kg      CHIEF COMPLAINT:   Acute on chronic hypoxemic respiratory failure   SUBJECTIVE   Patient is sitting up in bed, states analgesia is adequate.    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    Patient will be seen by palliative care today for goals of care - overall very unfortunate clinical scenario with poor prognosis.    PAST MEDICAL HISTORY   Past Medical History:  Diagnosis Date   Alcohol abuse    quit 09/2014   Arthritis    Hypertension    Leukopenia    resolved when stopped drinking 09/2014   Pneumonia      SURGICAL HISTORY   Past Surgical History:  Procedure Laterality Date   APPENDECTOMY     COLONOSCOPY N/A 11/09/2015   Procedure: COLONOSCOPY;  Surgeon: Josefine Class, MD;  Location: [facility] ENDOSCOPY;  Service: Endoscopy;  Laterality: N/A;     FAMILY HISTORY   Family History  Problem Relation Age of Onset   COPD Sister      SOCIAL HISTORY   Social History   Tobacco Use   Smoking status: Former Smoker    Packs/day: 0.10    Years: 40.00    Pack years: 4.00    Types: Cigarettes    Quit date: 03/07/2020    Years since quitting: 0.0   Smokeless tobacco: Never Used  Substance Use Topics   Alcohol use: Yes    Alcohol/week: 3.0 standard drinks    Types: 3 Shots of liquor per week    Comment: last drink was 3/10   Drug use: No     MEDICATIONS    Home Medication:    Current Medication:  Current Facility-Administered Medications:    0.9 %  sodium chloride infusion, , Intravenous, PRN, Wyvonnia Dusky, MD, Stopped at 03/11/20 0920   acetaminophen (TYLENOL) tablet 650 mg, 650 mg, Oral, Q6H PRN, Sharion Settler, NP, 650 mg at 03/11/20 0551   azithromycin (ZITHROMAX) 500 mg in sodium chloride 0.9 % 250 mL  IVPB, 500 mg, Intravenous, Q24H, Wyvonnia Dusky, MD, Last Rate: 250 mL/hr at 03/13/20 1016, 500 mg at 03/13/20 1016   benzonatate (TESSALON) capsule 200 mg, 200 mg, Oral, TID, Wyvonnia Dusky, MD, 200 mg at 03/13/20 0950   bisacodyl (DULCOLAX) EC tablet 5 mg, 5 mg, Oral, Daily PRN, Wyvonnia Dusky, MD, 5 mg at 03/11/20 0836   budesonide (PULMICORT) nebulizer solution 0.5 mg, 0.5 mg, Nebulization, BID, Blakeney, Dreama Saa, NP, 0.5 mg at 03/13/20 0750   Chlorhexidine Gluconate Cloth 2 % PADS 6 each, 6 each, Topical, Daily, Wyvonnia Dusky, MD, 6 each at 03/12/20 0957   enoxaparin (LOVENOX) injection 40 mg, 40 mg, Subcutaneous, Q24H, Jimmye Norman, Jamiese M, MD, 40 mg at 03/12/20 2035   fentaNYL (SUBLIMAZE) injection 12.5-25 mcg, 12.5-25 mcg, Intravenous, Q2H PRN, Awilda Bill, NP, 25 mcg at 03/13/20 1019   ipratropium-albuterol (DUONEB) 0.5-2.5 (3) MG/3ML nebulizer solution 3 mL, 3 mL, Nebulization, Q4H, Tyler Pita, MD, 3 mL at 03/13/20 0750   ipratropium-albuterol (DUONEB) 0.5-2.5 (3) MG/3ML nebulizer solution 3 mL, 3 mL, Nebulization, Q6H PRN, Awilda Bill, NP   ketorolac (TORADOL) tablet 10 mg, 10  mg, Oral, Q6H PRN, Sharion Settler, NP, 10 mg at 03/10/20 1823   methylPREDNISolone sodium succinate (SOLU-MEDROL) 125 mg/2 mL injection 60 mg, 60 mg, Intravenous, Q6H, Blakeney, Dana G, NP, 60 mg at 03/13/20 0903   metoprolol tartrate (LOPRESSOR) injection 5 mg, 5 mg, Intravenous, Q8H PRN, Wyvonnia Dusky, MD, 5 mg at 03/11/20 1341   metoprolol tartrate (LOPRESSOR) tablet 25 mg, 25 mg, Oral, BID, Wyvonnia Dusky, MD, 25 mg at 03/13/20 0950   ondansetron (ZOFRAN) tablet 4 mg, 4 mg, Oral, Q6H PRN **OR** ondansetron (ZOFRAN) injection 4 mg, 4 mg, Intravenous, Q6H PRN, Wyvonnia Dusky, MD, 4 mg at 03/12/20 2035   oxyCODONE (Oxy IR/ROXICODONE) immediate release tablet 10 mg, 10 mg, Oral, Q6H PRN, Wyvonnia Dusky, MD, 10 mg at 03/12/20 0957    piperacillin-tazobactam (ZOSYN) IVPB 3.375 g, 3.375 g, Intravenous, Q8H, Wyvonnia Dusky, MD, Last Rate: 12.5 mL/hr at 03/13/20 0602, 3.375 g at 03/13/20 0602   sodium chloride flush (NS) 0.9 % injection 3 mL, 3 mL, Intravenous, Q12H, Wyvonnia Dusky, MD, 3 mL at 03/13/20 1020    ALLERGIES   Patient has no known allergies.     REVIEW OF SYSTEMS    Review of Systems:  Gen:  Denies  fever, sweats, chills weigh loss  HEENT: Denies blurred vision, double vision, ear pain, eye pain, hearing loss, nose bleeds, sore throat Cardiac:  No dizziness, chest pain or heaviness, chest tightness,edema Resp:   Denies cough or sputum porduction, shortness of breath,wheezing, hemoptysis,  Gi: Denies swallowing difficulty, stomach pain, nausea or vomiting, diarrhea, constipation, bowel incontinence Gu:  Denies bladder incontinence, burning urine Ext:   Denies Joint pain, stiffness or swelling Skin: Denies  skin rash, easy bruising or bleeding or hives Endoc:  Denies polyuria, polydipsia , polyphagia or weight change Psych:   Denies depression, insomnia or hallucinations   Other:  All other systems negative   VS: BP (!) 168/80    Pulse 82    Temp 97.9 F (36.6 C) (Oral)    Resp 20    Ht 5\' 7"  (1.702 m)    Wt 63.8 kg    SpO2 98%    BMI 22.03 kg/m      PHYSICAL EXAM    GENERAL:NAD, no fevers, chills, no weakness no fatigue HEAD: Normocephalic, atraumatic.  EYES: Pupils equal, round, reactive to light. Extraocular muscles intact. No scleral icterus.  MOUTH: Moist mucosal membrane. Dentition intact. No abscess noted.  EAR, NOSE, THROAT: Clear without exudates. No external lesions.  NECK: Supple. No thyromegaly. No nodules. No JVD.  PULMONARY: decreased BS bilaterally CARDIOVASCULAR: S1 and S2. Regular rate and rhythm. No murmurs, rubs, or gallops. No edema. Pedal pulses 2+ bilaterally.  GASTROINTESTINAL: Soft, nontender, nondistended. No masses. Positive bowel sounds. No  hepatosplenomegaly.  MUSCULOSKELETAL: No swelling, clubbing, or edema. Range of motion full in all extremities.  NEUROLOGIC: Cranial nerves II through XII are intact. No gross focal neurological deficits. Sensation intact. Reflexes intact.  SKIN: No ulceration, lesions, rashes, or cyanosis. Skin warm and dry. Turgor intact.  PSYCHIATRIC: Mood, affect within normal limits. The patient is awake, alert and oriented x 3. Insight, judgment intact.       IMAGING    DG Chest 2 View  Result Date: 03/09/2020 CLINICAL DATA:  Follow-up pneumonia. EXAM: CHEST - 2 VIEW COMPARISON:  03/04/2020 FINDINGS: UPPER limits normal heart size again noted. Bilateral airspace opacities are again noted, with increased RIGHT middle lobe consolidation/atelectasis. A trace RIGHT pleural effusion  is noted. No pneumothorax or acute bony abnormality. IMPRESSION: Bilateral airspace opacities again noted with increased RIGHT middle lobe consolidation/atelectasis. Electronically Signed   By: Margarette Canada M.D.   On: 03/09/2020 13:12   CT Chest W Contrast  Result Date: 03/09/2020 CLINICAL DATA:  74 year old male with history of pneumonia. Mid right back pain. Swelling in the right flank. EXAM: CT CHEST, ABDOMEN, AND PELVIS WITH CONTRAST TECHNIQUE: Multidetector CT imaging of the chest, abdomen and pelvis was performed following the standard protocol during bolus administration of intravenous contrast. CONTRAST:  75mL OMNIPAQUE IOHEXOL 300 MG/ML  SOLN COMPARISON:  Chest CT 03/05/2020. CT the abdomen and pelvis 05/01/2004. FINDINGS: CT CHEST FINDINGS Cardiovascular: Heart size is normal. Small amount of pericardial fluid and/or thickening, slightly increased compared to the prior study, but unlikely to be of hemodynamic significance at this time. Atherosclerotic calcifications in the thoracic aorta as well as the left main, left anterior descending, left circumflex and right coronary arteries. Mediastinum/Nodes: Multiple enlarged  mediastinal and hilar lymph nodes bilaterally, bulkiest of which is in the low right paratracheal nodal station measuring 2.3 cm in short axis, low left paratracheal nodal station measuring 2.8 cm in short axis, and AP window measuring 1.9 cm in short axis. Hilar lymph nodes measure up to 2 cm in short axis. Esophagus is unremarkable in appearance. Esophagus is unremarkable in appearance. No axillary lymphadenopathy. Lungs/Pleura: Mass-like enlargement of the right infrahilar region best appreciated on axial image 40 of series 504 estimated to measure approximately 4.6 x 4.3 cm with postobstructive changes in the right middle lobe which appears to reflect a combination of consolidation and atelectasis. Multiple other pulmonary nodules are again noted throughout the lungs bilaterally, largest of which is in the right lower lobe measuring 2.5 x 2.1 cm (axial image 126 of series 505). Musculoskeletal: Enumerable lytic lesions throughout the visualized axial and appendicular skeleton, compatible with widespread metastatic disease to the bones. CT ABDOMEN PELVIS FINDINGS Hepatobiliary: 1.5 x 1.3 cm centrally low-attenuation lesion with some peripheral nodular enhancement and progressive centripetal filling in segment 3 of the liver, compatible with a cavernous hemangioma. No other suspicious hepatic lesions. No intra or extrahepatic biliary ductal dilatation. Gallbladder is normal in appearance. Pancreas: No pancreatic mass. No pancreatic ductal dilatation. No pancreatic or peripancreatic fluid collections or inflammatory changes. Spleen: Unremarkable. Adrenals/Urinary Tract: Bilateral kidneys and adrenal glands are normal in appearance. No hydroureteronephrosis. Urinary bladder is normal in appearance. Stomach/Bowel: Normal appearance of the stomach. No pathologic dilatation of small bowel or colon. Several colonic diverticulae are noted, without surrounding inflammatory changes to suggest an acute diverticulitis at  this time. The appendix is not confidently identified and may be surgically absent. Regardless, there are no inflammatory changes noted adjacent to the cecum to suggest the presence of an acute appendicitis at this time. Vascular/Lymphatic: Aortic atherosclerosis, without evidence of aneurysm or dissection noted in the abdominal or pelvic vasculature. No lymphadenopathy noted in the abdomen or pelvis. Reproductive: Prostate gland is enlarged and heterogeneous in appearance measuring 5.8 x 4.3 x 6.1 cm. Other: Small volume of ascites.  No pneumoperitoneum. Musculoskeletal: Numerous lytic osseous lesions are noted throughout the visualized axial and appendicular structures, highly concerning for widespread metastatic disease to the bones. IMPRESSION: 1. 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 The primary malignancy is not confidently identified on today's examination, however, given the mass-like appearance  of the right infrahilar region with the postobstructive changes in the right middle lobe, a primary bronchogenic carcinoma in the central right middle lobe is suspected. 2. Colonic diverticulosis without evidence of acute diverticulitis at this time. 3. 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 4. Severe prostatomegaly. 5. Aortic atherosclerosis, in addition to left main and 3 vessel coronary artery disease. Assessment for potential risk factor modification, dietary therapy or pharmacologic therapy may be warranted, if clinically indicated. 6. Additional incidental findings, as above. Electronically Signed   By: Vinnie Langton M.D.   On: 03/09/2020 14:45   CT ANGIO CHEST PE W OR WO CONTRAST  Result Date: 03/05/2020 CLINICAL DATA:  Shortness of breath bilateral pneumonia,  elevated D-dimer EXAM: CT ANGIOGRAPHY CHEST WITH CONTRAST TECHNIQUE: Multidetector CT imaging of the chest was performed using the standard protocol during bolus administration of intravenous contrast. Multiplanar CT image reconstructions and MIPs were obtained to evaluate the vascular anatomy. CONTRAST:  57mL OMNIPAQUE IOHEXOL 350 MG/ML SOLN COMPARISON:  Chest x-ray of 03/04/2020 FINDINGS: Cardiovascular: Calcified and noncalcified plaque throughout the thoracic aorta. No signs of acute aortic process. Small pericardial effusion. Heart size is normal. No signs of acute pulmonary embolism. Mediastinum/Nodes: Bulky mediastinal adenopathy. (Image 39, series 4): 2.9 cm AP window lymph node. (Image 38, series 4): 2.4 cm right paratracheal lymph node. Lymph nodes are seen throughout all nodal stations in the chest including the bilateral hila. Right hilar nodal tissue approximately 1.7 cm (image 50, series 4) anterior mediastinal/prevascular lymph nodes with enlargement as well. High AP window lymph node just below pre-vascular lymph nodes (image 39, series 4) 1.8 cm. Esophagus is grossly normal. No signs of frank absolute ir E adenopathy. 17 mm left thoracic inlet lymph node and similarly sized right thoracic inlet lymph nodes. Ovoid lymph node without fatty hilum in the left axilla measuring 10 mm. Lungs/Pleura: Nodular opacities throughout the chest with a background of ground-glass attenuation and septal thickening, largest area measuring 2.3 x 2.1 cm in the right lower lobe this shows low-density. Consolidative changes and volume loss in the right middle lobe. Narrowing of central airways at the right hilum also in the left infrahilar region. Patchy opacities bilaterally throughout the chest with nodular features none as discrete as the area in the right lung base. Small right pleural effusion layers dependently. Upper Abdomen: No signs of acute finding in the upper abdomen. Musculoskeletal: Numerous areas of lytic  change throughout the visualized skeleton, particularly the spine. These are too numerous to count with a focal area as an example on image 23 of series 4 measuring 11 mm. No signs of acute fracture. Review of the MIP images confirms the above findings. IMPRESSION: 1. No evidence of acute pulmonary embolism. 2. Bulky mediastinal and bilateral hilar adenopathy with nodular opacities throughout the chest and numerous areas of lytic change throughout the visualized skeleton. Findings concerning for primary pulmonary neoplasm or lymphoproliferative disorder with diffuse involvement/metastatic disease and with superimposed infection in the chest. 3. There was some mild nodal enlargement on previous exams. This is much worse and findings of diffuse bony lytic changes are also a new finding. 4. Some airway narrowing at the hila bilaterally worse on the right with some mild bronchial wall thickening. Superimposed infection may be due to postobstructive changes. 5. Small right pleural effusion. 6. Small pericardial effusion. 7. Aortic atherosclerosis. Aortic Atherosclerosis (ICD10-I70.0). Electronically Signed   By: Zetta Bills M.D.   On: 03/05/2020 13:27   CT Abdomen Pelvis  W Contrast  Result Date: 03/09/2020 CLINICAL DATA:  74 year old male with history of pneumonia. Mid right back pain. Swelling in the right flank. EXAM: CT CHEST, ABDOMEN, AND PELVIS WITH CONTRAST TECHNIQUE: Multidetector CT imaging of the chest, abdomen and pelvis was performed following the standard protocol during bolus administration of intravenous contrast. CONTRAST:  13mL OMNIPAQUE IOHEXOL 300 MG/ML  SOLN COMPARISON:  Chest CT 03/05/2020. CT the abdomen and pelvis 05/01/2004. FINDINGS: CT CHEST FINDINGS Cardiovascular: Heart size is normal. Small amount of pericardial fluid and/or thickening, slightly increased compared to the prior study, but unlikely to be of hemodynamic significance at this time. Atherosclerotic calcifications in the  thoracic aorta as well as the left main, left anterior descending, left circumflex and right coronary arteries. Mediastinum/Nodes: Multiple enlarged mediastinal and hilar lymph nodes bilaterally, bulkiest of which is in the low right paratracheal nodal station measuring 2.3 cm in short axis, low left paratracheal nodal station measuring 2.8 cm in short axis, and AP window measuring 1.9 cm in short axis. Hilar lymph nodes measure up to 2 cm in short axis. Esophagus is unremarkable in appearance. Esophagus is unremarkable in appearance. No axillary lymphadenopathy. Lungs/Pleura: Mass-like enlargement of the right infrahilar region best appreciated on axial image 40 of series 504 estimated to measure approximately 4.6 x 4.3 cm with postobstructive changes in the right middle lobe which appears to reflect a combination of consolidation and atelectasis. Multiple other pulmonary nodules are again noted throughout the lungs bilaterally, largest of which is in the right lower lobe measuring 2.5 x 2.1 cm (axial image 126 of series 505). Musculoskeletal: Enumerable lytic lesions throughout the visualized axial and appendicular skeleton, compatible with widespread metastatic disease to the bones. CT ABDOMEN PELVIS FINDINGS Hepatobiliary: 1.5 x 1.3 cm centrally low-attenuation lesion with some peripheral nodular enhancement and progressive centripetal filling in segment 3 of the liver, compatible with a cavernous hemangioma. No other suspicious hepatic lesions. No intra or extrahepatic biliary ductal dilatation. Gallbladder is normal in appearance. Pancreas: No pancreatic mass. No pancreatic ductal dilatation. No pancreatic or peripancreatic fluid collections or inflammatory changes. Spleen: Unremarkable. Adrenals/Urinary Tract: Bilateral kidneys and adrenal glands are normal in appearance. No hydroureteronephrosis. Urinary bladder is normal in appearance. Stomach/Bowel: Normal appearance of the stomach. No pathologic  dilatation of small bowel or colon. Several colonic diverticulae are noted, without surrounding inflammatory changes to suggest an acute diverticulitis at this time. The appendix is not confidently identified and may be surgically absent. Regardless, there are no inflammatory changes noted adjacent to the cecum to suggest the presence of an acute appendicitis at this time. Vascular/Lymphatic: Aortic atherosclerosis, without evidence of aneurysm or dissection noted in the abdominal or pelvic vasculature. No lymphadenopathy noted in the abdomen or pelvis. Reproductive: Prostate gland is enlarged and heterogeneous in appearance measuring 5.8 x 4.3 x 6.1 cm. Other: Small volume of ascites.  No pneumoperitoneum. Musculoskeletal: Numerous lytic osseous lesions are noted throughout the visualized axial and appendicular structures, highly concerning for widespread metastatic disease to the bones. IMPRESSION: 1. 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 The primary malignancy is not confidently identified on today's examination, however, given the mass-like appearance of the right infrahilar region with the postobstructive changes in the right middle lobe, a primary bronchogenic carcinoma in the central right middle lobe is suspected. 2. Colonic diverticulosis without evidence of acute diverticulitis at this time. 3.  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 4. Severe prostatomegaly. 5. Aortic atherosclerosis, in addition to left main and 3 vessel coronary artery disease. Assessment for potential risk factor modification, dietary therapy or pharmacologic therapy may be warranted, if clinically indicated. 6. Additional incidental findings, as above. Electronically Signed   By: Vinnie Langton  M.D.   On: 03/09/2020 14:45   DG Chest Port 1 View  Result Date: 03/11/2020 CLINICAL DATA:  Dyspnea, former smoker, history of pneumonia EXAM: PORTABLE CHEST 1 VIEW COMPARISON:  03/09/2020 chest radiograph. FINDINGS: Stable cardiomediastinal silhouette with normal heart size. No pneumothorax. Stable mild blunting of the right costophrenic angle suggesting small pleural effusion. No left pleural effusion. Worsening masslike consolidation in right infrahilar and right lower lung. Worsening hazy opacities in the left lung. Nodular opacity again noted in the peripheral right lower lobe. IMPRESSION: 1. Worsening masslike consolidation in the right infrahilar and right lower lung, likely a combination of pulmonary neoplasm and worsening postobstructive pneumonia as described on recent chest CT from 03/09/2020. 2. Nodular opacity in the peripheral right lower lung compatible with known pulmonary nodule on recent chest CT. 3. Worsening hazy opacities in the left lung, also suspicious for pneumonia versus lymphangitic tumor. Electronically Signed   By: Ilona Sorrel M.D.   On: 03/11/2020 15:23   DG Chest Port 1 View  Result Date: 03/04/2020 CLINICAL DATA:  Pt presents to ED via POV with c/o cough, and SOB. Pt states when he coughs he has substernal CP. Upon arrival to ED pt's RA sats 85-87% on RA. Pt also c/o dyspnea on exertion. Pt placed on 2L via [place] at this time. EXAM: PORTABLE CHEST 1 VIEW COMPARISON:  Chest radiograph 03/28/2018 FINDINGS: Stable cardiomediastinal contours. The heart size. Lungs are hyperinflated. There are new infiltrates throughout the bilateral lower lungs concerning for infection. Background chronic bronchitic change. No pneumothorax or significant pleural effusion. No acute finding in the visualized skeleton. IMPRESSION: New infiltrates throughout the bilateral lower lungs concerning for infection, less likely edema. Recommend radiographic follow-up to resolution. Electronically Signed   By:  Audie Pinto M.D.   On: 03/04/2020 14:12   ECHOCARDIOGRAM COMPLETE  Result Date: 03/12/2020    ECHOCARDIOGRAM REPORT   Patient Name:   Tagen Mandelbaum Date of Exam: 03/12/2020 Medical Rec #:  [id number]   Height:       67.0 in Accession #:    [id number]  Weight:       145.0 lb Date of Birth:  March 19, 1946  BSA:          1.764 m Patient Age:    62 years    BP:           152/98 mmHg Patient Gender: M           HR:           72 bpm. Exam Location:  [facility] Procedure: 2D Echo, Color Doppler and Cardiac Doppler Indications:     I31.3 Pericardial effusion  History:         Patient has prior history of Echocardiogram examinations. Risk                  Factors:Hypertension.  Sonographer:     Charmayne Sheer RDCS (AE) Referring Phys:  [id number] Tyler Pita Diagnosing Phys: Kathlyn Sacramento MD  Sonographer Comments: Suboptimal subcostal window. TDS due to inability of pt to stay in position on left side. IMPRESSIONS  1. Left ventricular ejection fraction, by estimation, is 55  to 60%. The left ventricle has normal function. The left ventricle has no regional wall motion abnormalities. There is mild left ventricular hypertrophy. Left ventricular diastolic parameters are consistent with Grade I diastolic dysfunction (impaired relaxation).  2. Right ventricular systolic function is normal. The right ventricular size is normal. Tricuspid regurgitation signal is inadequate for assessing PA pressure.  3. The mitral valve is normal in structure. Mild mitral valve regurgitation. No evidence of mitral stenosis.  4. The aortic valve is normal in structure. Aortic valve regurgitation is not visualized. Mild aortic valve sclerosis is present, with no evidence of aortic valve stenosis.  5. The inferior vena cava is normal in size with <50% respiratory variability, suggesting right atrial pressure of 8 mmHg. FINDINGS  Left Ventricle: Left ventricular ejection fraction, by estimation, is 55 to 60%. The left ventricle has normal function. The left  ventricle has no regional wall motion abnormalities. The left ventricular internal cavity size was normal in size. There is  mild left ventricular hypertrophy. Left ventricular diastolic parameters are consistent with Grade I diastolic dysfunction (impaired relaxation). Right Ventricle: The right ventricular size is normal. No increase in right ventricular wall thickness. Right ventricular systolic function is normal. Tricuspid regurgitation signal is inadequate for assessing PA pressure. Left Atrium: Left atrial size was normal in size. Right Atrium: Right atrial size was normal in size. Pericardium: A small pericardial effusion is present. The pericardial effusion is circumferential. There is no evidence of cardiac tamponade. Mitral Valve: The mitral valve is normal in structure. Normal mobility of the mitral valve leaflets. Mild mitral valve regurgitation. No evidence of mitral valve stenosis. MV peak gradient, 7.0 mmHg. The mean mitral valve gradient is 2.0 mmHg. Tricuspid Valve: The tricuspid valve is normal in structure. Tricuspid valve regurgitation is not demonstrated. No evidence of tricuspid stenosis. Aortic Valve: The aortic valve is normal in structure. Aortic valve regurgitation is not visualized. Mild aortic valve sclerosis is present, with no evidence of aortic valve stenosis. Aortic valve mean gradient measures 2.0 mmHg. Aortic valve peak gradient measures 3.5 mmHg. Aortic valve area, by VTI measures 2.04 cm. Pulmonic Valve: The pulmonic valve was normal in structure. Pulmonic valve regurgitation is trivial. No evidence of pulmonic stenosis. Aorta: The aortic root is normal in size and structure. Venous: The inferior vena cava is normal in size with less than 50% respiratory variability, suggesting right atrial pressure of 8 mmHg. IAS/Shunts: No atrial level shunt detected by color flow Doppler.  LEFT VENTRICLE PLAX 2D LVIDd:         4.34 cm  Diastology LVIDs:         3.04 cm  LV e' lateral:   4.46  cm/s LV PW:         1.10 cm  LV E/e' lateral: 16.6 LV IVS:        0.94 cm  LV e' medial:    3.59 cm/s LVOT diam:     2.00 cm  LV E/e' medial:  20.7 LV SV:         36 LV SV Index:   20 LVOT Area:     3.14 cm  LEFT ATRIUM         Index LA diam:    3.50 cm 1.98 cm/m  AORTIC VALVE                   PULMONIC VALVE AV Area (Vmax):    2.30 cm    PV Vmax:  0.85 m/s AV Area (Vmean):   2.22 cm    PV Vmean:      59.500 cm/s AV Area (VTI):     2.04 cm    PV VTI:        0.148 m AV Vmax:           93.90 cm/s  PV Peak grad:  2.9 mmHg AV Vmean:          58.700 cm/s PV Mean grad:  2.0 mmHg AV VTI:            0.177 m AV Peak Grad:      3.5 mmHg AV Mean Grad:      2.0 mmHg LVOT Vmax:         68.70 cm/s LVOT Vmean:        41.500 cm/s LVOT VTI:          0.115 m LVOT/AV VTI ratio: 0.65  AORTA Ao Root diam: 3.20 cm MITRAL VALVE MV Area (PHT): 2.99 cm    SHUNTS MV Peak grad:  7.0 mmHg    Systemic VTI:  0.12 m MV Mean grad:  2.0 mmHg    Systemic Diam: 2.00 cm MV Vmax:       1.32 m/s MV Vmean:      56.1 cm/s MV Decel Time: 254 msec MV E velocity: 74.20 cm/s MV A velocity: 98.50 cm/s MV E/A ratio:  0.75 Kathlyn Sacramento MD Electronically signed by Kathlyn Sacramento MD Signature Date/Time: 03/12/2020/3:00:50 PM    Final    [place] CORE BIOPSY (SOFT TISSUE)  Result Date: 03/07/2020 INDICATION: 74 year old male with a history of pathologic supraclavicular adenopathy, likely metastatic lung cancer EXAM: ULTRASOUND-GUIDED BIOPSY RIGHT SUPRACLAVICULAR LYMPH NODE MEDICATIONS: None. ANESTHESIA/SEDATION: None. FLUOROSCOPY TIME:  None COMPLICATIONS: None PROCEDURE: Informed written consent was obtained from the patient after a thorough discussion of the procedural risks, benefits and alternatives. All questions were addressed. Maximal Sterile Barrier Technique was utilized including caps, mask, sterile gowns, sterile gloves, sterile drape, hand hygiene and skin antiseptic. A timeout was performed prior to the initiation of the procedure. Ultrasound  survey was performed with images stored and sent to PACs. The neck was prepped with chlorhexidine in a sterile fashion, and a sterile drape was applied covering the operative field. A sterile gown and sterile gloves were used for the procedure. Local anesthesia was provided with 1% Lidocaine. Ultrasound guidance was used to infiltrate the region with 1% lidocaine for local anesthesia. Multiple separate 18 gauge core needle biopsy were then acquired of the right supraclavicular lymph node using ultrasound guidance. Images were stored. Final image was stored after biopsy. Patient tolerated the procedure well and remained hemodynamically stable throughout. No complications were encountered and no significant blood loss was encounter IMPRESSION: Status post ultrasound-guided right supraclavicular lymph node biopsy. Signed, Dulcy Fanny. Dellia Nims, RPVI [facility] Electronically Signed   By: Corrie Mckusick D.O.   On: 03/07/2020 08:42      ASSESSMENT/PLAN   Acute on chronic hypoxemic respiratory failure    -patient with severe centrilobular emphysema and Stage 4 lung adenocarcinoma   -goals of care discourse yesterday with patient and wife - currently no aggressive measures -decrease Solumedrol from 80 to 60 bid -d/c vancomycin due to negative MRSA pcr -continue zosyn for 5-7d CAP course -appreciate PALS team - patient on morphine -chest PT  -goals of care with possible comfort measures   Multiple co-morbid conditions  - defer to hospitalist team  Thank you for allowing me to participate in the care of this patient.   This document was prepared using Dragon voice recognition software and may include unintentional dictation errors.  Critical care provider statement:    Critical care time (minutes):  33   Critical care time was exclusive of:  Separately billable procedures and  treating other patients   Critical care was necessary to  treat or prevent imminent or  life-threatening deterioration of the following conditions:  acute on chronic hypoxemic respiratory failure, lung cancer, emphysema, multiple comorbid conditions   Critical care was time spent personally by me on the following  activities:  Development of treatment plan with patient or surrogate,  discussions with consultants, evaluation of patient's response to  treatment, examination of patient, obtaining history from patient or  surrogate, ordering and performing treatments and interventions, ordering  and review of laboratory studies and re-evaluation of patient's condition   I assumed direction of critical care for this patient from another  provider in my specialty: no        Ottie Glazier, M.D.  Division of [facility]

## 2020-03-13 NOTE — Consult Note (Signed)
[facility] CONSULT NOTE  Patient Care Team: Revelo, Elyse Jarvis, MD as PCP - General (Family Medicine)  CHIEF COMPLAINTS/PURPOSE OF CONSULTATION:  Lung cancer  HISTORY OF PRESENTING ILLNESS:  Troy Shepherd 74 y.o.  male longstanding history of smoking-was recently discharged in the hospital approximately 4 days ago when he was admitted for worsening shortness of breath and cough.  At the last admission patient noted to have bilateral significant mediastinal adenopathy; lung nodules and also right cervical lymph node.  Patient underwent a biopsy of the cervical lymph node-positive for adenocarcinoma suggestive of lung origin.  Patient was supposed to follow-up in the clinic this week to discuss treatment options for his lung cancer; when symptoms of shortness of breath got worse/respiratory failure-and is currently admitted to ICU.   Patient is currently on high flow nasal oxygen.  He is also on antibiotics.  There is no significant improvement of his respiratory status few days.  He remains in the ICU.  Oncology has been consulted for further recommendations.     Patient on high flow nasal oxygen 50%.;  Unable to perform review of systems. Review of Systems  Unable to perform ROS: Acuity of condition     MEDICAL HISTORY:  Past Medical History:  Diagnosis Date  . Alcohol abuse    quit 09/2014  . Arthritis   . Hypertension   . Leukopenia    resolved when stopped drinking 09/2014  . Pneumonia     SURGICAL HISTORY: Past Surgical History:  Procedure Laterality Date  . APPENDECTOMY    . COLONOSCOPY N/A 11/09/2015   Procedure: COLONOSCOPY;  Surgeon: Josefine Class, MD;  Location: [facility] ENDOSCOPY;  Service: Endoscopy;  Laterality: N/A;    SOCIAL HISTORY: Social History   Socioeconomic History  . Marital status: Married    Spouse name: Not on file  . Number of children: Not on file  . Years of education: Not on file  . Highest education level: Not on  file  Occupational History  . Not on file  Tobacco Use  . Smoking status: Former Smoker    Packs/day: 0.10    Years: 40.00    Pack years: 4.00    Types: Cigarettes    Quit date: 03/07/2020    Years since quitting: 0.0  . Smokeless tobacco: Never Used  Substance and Sexual Activity  . Alcohol use: Yes    Alcohol/week: 3.0 standard drinks    Types: 3 Shots of liquor per week    Comment: last drink was 3/10  . Drug use: No  . Sexual activity: Not on file  Other Topics Concern  . Not on file  Social History Narrative   With wife; [place]. Recently quit Smoking; alcohol; used to work in Water engineer.    Social Determinants of Health   Financial Resource Strain:   . Difficulty of Paying Living Expenses:   Food Insecurity:   . Worried About Charity fundraiser in the Last Year:   . Arboriculturist in the Last Year:   Transportation Needs:   . Film/video editor (Medical):   Marland Kitchen Lack of Transportation (Non-Medical):   Physical Activity:   . Days of Exercise per Week:   . Minutes of Exercise per Session:   Stress:   . Feeling of Stress :   Social Connections:   . Frequency of Communication with Friends and Family:   . Frequency of Social Gatherings with Friends and Family:   . Attends Religious Services:   .  Active Member of Clubs or Organizations:   . Attends Archivist Meetings:   Marland Kitchen Marital Status:   Intimate Partner Violence:   . Fear of Current or Ex-Partner:   . Emotionally Abused:   Marland Kitchen Physically Abused:   . Sexually Abused:     FAMILY HISTORY: Family History  Problem Relation Age of Onset  . COPD Sister     ALLERGIES:  has No Known Allergies.  MEDICATIONS:  Current Facility-Administered Medications  Medication Dose Route Frequency Provider Last Rate Last Admin  . 0.9 %  sodium chloride infusion   Intravenous PRN Wyvonnia Dusky, MD   Stopped at 03/11/20 0920  . acetaminophen (TYLENOL) tablet 650 mg  650 mg Oral Q6H PRN  Sharion Settler, NP   650 mg at 03/11/20 0551  . benzonatate (TESSALON) capsule 200 mg  200 mg Oral TID Wyvonnia Dusky, MD   200 mg at 03/13/20 1607  . bisacodyl (DULCOLAX) EC tablet 5 mg  5 mg Oral Daily PRN Wyvonnia Dusky, MD   5 mg at 03/11/20 0836  . budesonide (PULMICORT) nebulizer solution 0.5 mg  0.5 mg Nebulization BID Awilda Bill, NP   0.5 mg at 03/13/20 0750  . Chlorhexidine Gluconate Cloth 2 % PADS 6 each  6 each Topical Daily Wyvonnia Dusky, MD   6 each at 03/12/20 0957  . ipratropium-albuterol (DUONEB) 0.5-2.5 (3) MG/3ML nebulizer solution 3 mL  3 mL Nebulization Q4H Tyler Pita, MD   3 mL at 03/13/20 1540  . ipratropium-albuterol (DUONEB) 0.5-2.5 (3) MG/3ML nebulizer solution 3 mL  3 mL Nebulization Q6H PRN Awilda Bill, NP      . LORazepam (ATIVAN) injection 1-2 mg  1-2 mg Intravenous Q4H PRN Borders, Kirt Boys, NP      . methylPREDNISolone sodium succinate (SOLU-MEDROL) 125 mg/2 mL injection 60 mg  60 mg Intravenous Q6H Awilda Bill, NP   60 mg at 03/13/20 1353  . metoprolol tartrate (LOPRESSOR) injection 5 mg  5 mg Intravenous Q8H PRN Wyvonnia Dusky, MD   5 mg at 03/11/20 1341  . metoprolol tartrate (LOPRESSOR) tablet 25 mg  25 mg Oral BID Wyvonnia Dusky, MD   25 mg at 03/13/20 0950  . morphine 2 MG/ML injection 2-4 mg  2-4 mg Intravenous Q1H PRN Borders, Kirt Boys, NP   2 mg at 03/13/20 1605  . ondansetron (ZOFRAN) tablet 4 mg  4 mg Oral Q6H PRN Wyvonnia Dusky, MD       Or  . ondansetron [facility]) injection 4 mg  4 mg Intravenous Q6H PRN Wyvonnia Dusky, MD   4 mg at 03/12/20 2035  . oxyCODONE (Oxy IR/ROXICODONE) immediate release tablet 10 mg  10 mg Oral Q6H PRN Wyvonnia Dusky, MD   10 mg at 03/12/20 0957  . sodium chloride flush (NS) 0.9 % injection 3 mL  3 mL Intravenous Q12H Wyvonnia Dusky, MD   3 mL at 03/13/20 1020      .  PHYSICAL EXAMINATION:  Vitals:   03/13/20 1400 03/13/20 1540  BP: (!) 176/97    Pulse: 80   Resp: (!) 21   Temp:    SpO2: 100% 100%   Filed Weights   03/09/20 1204 03/13/20 0500  Weight: 145 lb (65.8 kg) 140 lb 10.5 oz (63.8 kg)    Physical Exam  Constitutional: He is oriented to person, place, and time.  Cachectic appearing African-American male patient.  In moderate respiratory distress  on high flow nasal oxygen.  He is accompanied by his wife.  HENT:  Head: Normocephalic and atraumatic.  Mouth/Throat: Oropharynx is clear and moist. No oropharyngeal exudate.  Eyes: Pupils are equal, round, and reactive to light.  Cardiovascular: Normal rate and regular rhythm.  Pulmonary/Chest: No respiratory distress. He has no wheezes.  Decreased air entry bilaterally.  Abdominal: Soft. Bowel sounds are normal. He exhibits no distension and no mass. There is no abdominal tenderness. There is no rebound and no guarding.  Musculoskeletal:        General: No tenderness or edema. Normal range of motion.     Cervical back: Normal range of motion and neck supple.  Neurological: He is alert and oriented to person, place, and time.  Skin: Skin is warm.  Psychiatric: Affect normal.     LABORATORY DATA:  I have reviewed the data as listed Lab Results  Component Value Date   WBC 24.8 (H) 03/13/2020   HGB 10.5 (L) 03/13/2020   HCT 29.7 (L) 03/13/2020   MCV 91.4 03/13/2020   PLT 229 03/13/2020   Recent Labs    03/09/20 1230 03/09/20 1230 03/10/20 0416 03/10/20 0416 03/11/20 0747 03/12/20 0434 03/13/20 0458  NA 138   < > 137   < > 136 135 139  K 3.2*   < > 3.9   < > 4.4 4.2 4.0  CL 101   < > 104   < > 104 96* 94*  CO2 25   < > 22   < > 22 23 31   GLUCOSE 88   < > 86   < > 70 256* 155*  BUN 32*   < > 27*   < > 31* 41* 46*  CREATININE 1.19   < > 1.11   < > 1.25* 1.53* 1.81*  CALCIUM 11.0*   < > 10.5*   < > 11.8* 10.8* 10.3  GFRNONAA 60*   < > >60   < > 56* 44* 36*  GFRAA >60   < > >60   < > >60 51* 42*  PROT 7.2  --  6.3*  --  6.3*  --   --   ALBUMIN 2.4*  --   2.1*  --  2.2*  --   --   AST 75*  --  38  --  31  --   --   ALT 73*  --  47*  --  33  --   --   ALKPHOS 181*  --  132*  --  126  --   --   BILITOT 0.9  --  0.8  --  1.3*  --   --    < > = values in this interval not displayed.    RADIOGRAPHIC STUDIES: I have personally reviewed the radiological images as listed and agreed with the findings in the report. DG Chest 2 View  Result Date: 03/09/2020 CLINICAL DATA:  Follow-up pneumonia. EXAM: CHEST - 2 VIEW COMPARISON:  03/04/2020 FINDINGS: UPPER limits normal heart size again noted. Bilateral airspace opacities are again noted, with increased RIGHT middle lobe consolidation/atelectasis. A trace RIGHT pleural effusion is noted. No pneumothorax or acute bony abnormality. IMPRESSION: Bilateral airspace opacities again noted with increased RIGHT middle lobe consolidation/atelectasis. Electronically Signed   By: Margarette Canada M.D.   On: 03/09/2020 13:12   CT Chest W Contrast  Result Date: 03/09/2020 CLINICAL DATA:  74 year old male with history of pneumonia. Mid right back pain. Swelling in the right  flank. EXAM: CT CHEST, ABDOMEN, AND PELVIS WITH CONTRAST TECHNIQUE: Multidetector CT imaging of the chest, abdomen and pelvis was performed following the standard protocol during bolus administration of intravenous contrast. CONTRAST:  2mL OMNIPAQUE IOHEXOL 300 MG/ML  SOLN COMPARISON:  Chest CT 03/05/2020. CT the abdomen and pelvis 05/01/2004. FINDINGS: CT CHEST FINDINGS Cardiovascular: Heart size is normal. Small amount of pericardial fluid and/or thickening, slightly increased compared to the prior study, but unlikely to be of hemodynamic significance at this time. Atherosclerotic calcifications in the thoracic aorta as well as the left main, left anterior descending, left circumflex and right coronary arteries. Mediastinum/Nodes: Multiple enlarged mediastinal and hilar lymph nodes bilaterally, bulkiest of which is in the low right paratracheal nodal station  measuring 2.3 cm in short axis, low left paratracheal nodal station measuring 2.8 cm in short axis, and AP window measuring 1.9 cm in short axis. Hilar lymph nodes measure up to 2 cm in short axis. Esophagus is unremarkable in appearance. Esophagus is unremarkable in appearance. No axillary lymphadenopathy. Lungs/Pleura: Mass-like enlargement of the right infrahilar region best appreciated on axial image 40 of series 504 estimated to measure approximately 4.6 x 4.3 cm with postobstructive changes in the right middle lobe which appears to reflect a combination of consolidation and atelectasis. Multiple other pulmonary nodules are again noted throughout the lungs bilaterally, largest of which is in the right lower lobe measuring 2.5 x 2.1 cm (axial image 126 of series 505). Musculoskeletal: Enumerable lytic lesions throughout the visualized axial and appendicular skeleton, compatible with widespread metastatic disease to the bones. CT ABDOMEN PELVIS FINDINGS Hepatobiliary: 1.5 x 1.3 cm centrally low-attenuation lesion with some peripheral nodular enhancement and progressive centripetal filling in segment 3 of the liver, compatible with a cavernous hemangioma. No other suspicious hepatic lesions. No intra or extrahepatic biliary ductal dilatation. Gallbladder is normal in appearance. Pancreas: No pancreatic mass. No pancreatic ductal dilatation. No pancreatic or peripancreatic fluid collections or inflammatory changes. Spleen: Unremarkable. Adrenals/Urinary Tract: Bilateral kidneys and adrenal glands are normal in appearance. No hydroureteronephrosis. Urinary bladder is normal in appearance. Stomach/Bowel: Normal appearance of the stomach. No pathologic dilatation of small bowel or colon. Several colonic diverticulae are noted, without surrounding inflammatory changes to suggest an acute diverticulitis at this time. The appendix is not confidently identified and may be surgically absent. Regardless, there are no  inflammatory changes noted adjacent to the cecum to suggest the presence of an acute appendicitis at this time. Vascular/Lymphatic: Aortic atherosclerosis, without evidence of aneurysm or dissection noted in the abdominal or pelvic vasculature. No lymphadenopathy noted in the abdomen or pelvis. Reproductive: Prostate gland is enlarged and heterogeneous in appearance measuring 5.8 x 4.3 x 6.1 cm. Other: Small volume of ascites.  No pneumoperitoneum. Musculoskeletal: Numerous lytic osseous lesions are noted throughout the visualized axial and appendicular structures, highly concerning for widespread metastatic disease to the bones. IMPRESSION: 1. 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 The primary malignancy is not confidently identified on today's examination, however, given the mass-like appearance of the right infrahilar region with the postobstructive changes in the right middle lobe, a primary bronchogenic carcinoma in the central right middle lobe is suspected. 2. Colonic diverticulosis without evidence of acute diverticulitis at this time. 3. 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 4. Severe prostatomegaly. 5. Aortic atherosclerosis, in addition to left main and 3  vessel coronary artery disease. Assessment for potential risk factor modification, dietary therapy or pharmacologic therapy may be warranted, if clinically indicated. 6. Additional incidental findings, as above. Electronically Signed   By: Vinnie Langton M.D.   On: 03/09/2020 14:45   CT ANGIO CHEST PE W OR WO CONTRAST  Result Date: 03/05/2020 CLINICAL DATA:  Shortness of breath bilateral pneumonia, elevated D-dimer EXAM: CT ANGIOGRAPHY CHEST WITH CONTRAST TECHNIQUE: Multidetector CT imaging of the chest was  performed using the standard protocol during bolus administration of intravenous contrast. Multiplanar CT image reconstructions and MIPs were obtained to evaluate the vascular anatomy. CONTRAST:  17mL OMNIPAQUE IOHEXOL 350 MG/ML SOLN COMPARISON:  Chest x-ray of 03/04/2020 FINDINGS: Cardiovascular: Calcified and noncalcified plaque throughout the thoracic aorta. No signs of acute aortic process. Small pericardial effusion. Heart size is normal. No signs of acute pulmonary embolism. Mediastinum/Nodes: Bulky mediastinal adenopathy. (Image 39, series 4): 2.9 cm AP window lymph node. (Image 38, series 4): 2.4 cm right paratracheal lymph node. Lymph nodes are seen throughout all nodal stations in the chest including the bilateral hila. Right hilar nodal tissue approximately 1.7 cm (image 50, series 4) anterior mediastinal/prevascular lymph nodes with enlargement as well. High AP window lymph node just below pre-vascular lymph nodes (image 39, series 4) 1.8 cm. Esophagus is grossly normal. No signs of frank absolute ir E adenopathy. 17 mm left thoracic inlet lymph node and similarly sized right thoracic inlet lymph nodes. Ovoid lymph node without fatty hilum in the left axilla measuring 10 mm. Lungs/Pleura: Nodular opacities throughout the chest with a background of ground-glass attenuation and septal thickening, largest area measuring 2.3 x 2.1 cm in the right lower lobe this shows low-density. Consolidative changes and volume loss in the right middle lobe. Narrowing of central airways at the right hilum also in the left infrahilar region. Patchy opacities bilaterally throughout the chest with nodular features none as discrete as the area in the right lung base. Small right pleural effusion layers dependently. Upper Abdomen: No signs of acute finding in the upper abdomen. Musculoskeletal: Numerous areas of lytic change throughout the visualized skeleton, particularly the spine. These are too numerous to count with a focal  area as an example on image 23 of series 4 measuring 11 mm. No signs of acute fracture. Review of the MIP images confirms the above findings. IMPRESSION: 1. No evidence of acute pulmonary embolism. 2. Bulky mediastinal and bilateral hilar adenopathy with nodular opacities throughout the chest and numerous areas of lytic change throughout the visualized skeleton. Findings concerning for primary pulmonary neoplasm or lymphoproliferative disorder with diffuse involvement/metastatic disease and with superimposed infection in the chest. 3. There was some mild nodal enlargement on previous exams. This is much worse and findings of diffuse bony lytic changes are also a new finding. 4. Some airway narrowing at the hila bilaterally worse on the right with some mild bronchial wall thickening. Superimposed infection may be due to postobstructive changes. 5. Small right pleural effusion. 6. Small pericardial effusion. 7. Aortic atherosclerosis. Aortic Atherosclerosis (ICD10-I70.0). Electronically Signed   By: Zetta Bills M.D.   On: 03/05/2020 13:27   CT Abdomen Pelvis W Contrast  Result Date: 03/09/2020 CLINICAL DATA:  74 year old male with history of pneumonia. Mid right back pain. Swelling in the right flank. EXAM: CT CHEST, ABDOMEN, AND PELVIS WITH CONTRAST TECHNIQUE: Multidetector CT imaging of the chest, abdomen and pelvis was performed following the standard protocol during bolus administration of intravenous contrast. CONTRAST:  6mL OMNIPAQUE IOHEXOL 300 MG/ML  SOLN COMPARISON:  Chest CT 03/05/2020. CT the abdomen and pelvis 05/01/2004. FINDINGS: CT CHEST FINDINGS Cardiovascular: Heart size is normal. Small amount of pericardial fluid and/or thickening, slightly increased compared to the prior study, but unlikely to be of hemodynamic significance at this time. Atherosclerotic calcifications in the thoracic aorta as well as the left main, left anterior descending, left circumflex and right coronary arteries.  Mediastinum/Nodes: Multiple enlarged mediastinal and hilar lymph nodes bilaterally, bulkiest of which is in the low right paratracheal nodal station measuring 2.3 cm in short axis, low left paratracheal nodal station measuring 2.8 cm in short axis, and AP window measuring 1.9 cm in short axis. Hilar lymph nodes measure up to 2 cm in short axis. Esophagus is unremarkable in appearance. Esophagus is unremarkable in appearance. No axillary lymphadenopathy. Lungs/Pleura: Mass-like enlargement of the right infrahilar region best appreciated on axial image 40 of series 504 estimated to measure approximately 4.6 x 4.3 cm with postobstructive changes in the right middle lobe which appears to reflect a combination of consolidation and atelectasis. Multiple other pulmonary nodules are again noted throughout the lungs bilaterally, largest of which is in the right lower lobe measuring 2.5 x 2.1 cm (axial image 126 of series 505). Musculoskeletal: Enumerable lytic lesions throughout the visualized axial and appendicular skeleton, compatible with widespread metastatic disease to the bones. CT ABDOMEN PELVIS FINDINGS Hepatobiliary: 1.5 x 1.3 cm centrally low-attenuation lesion with some peripheral nodular enhancement and progressive centripetal filling in segment 3 of the liver, compatible with a cavernous hemangioma. No other suspicious hepatic lesions. No intra or extrahepatic biliary ductal dilatation. Gallbladder is normal in appearance. Pancreas: No pancreatic mass. No pancreatic ductal dilatation. No pancreatic or peripancreatic fluid collections or inflammatory changes. Spleen: Unremarkable. Adrenals/Urinary Tract: Bilateral kidneys and adrenal glands are normal in appearance. No hydroureteronephrosis. Urinary bladder is normal in appearance. Stomach/Bowel: Normal appearance of the stomach. No pathologic dilatation of small bowel or colon. Several colonic diverticulae are noted, without surrounding inflammatory changes to  suggest an acute diverticulitis at this time. The appendix is not confidently identified and may be surgically absent. Regardless, there are no inflammatory changes noted adjacent to the cecum to suggest the presence of an acute appendicitis at this time. Vascular/Lymphatic: Aortic atherosclerosis, without evidence of aneurysm or dissection noted in the abdominal or pelvic vasculature. No lymphadenopathy noted in the abdomen or pelvis. Reproductive: Prostate gland is enlarged and heterogeneous in appearance measuring 5.8 x 4.3 x 6.1 cm. Other: Small volume of ascites.  No pneumoperitoneum. Musculoskeletal: Numerous lytic osseous lesions are noted throughout the visualized axial and appendicular structures, highly concerning for widespread metastatic disease to the bones. IMPRESSION: 1. 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 The primary malignancy is not confidently identified on today's examination, however, given the mass-like appearance of the right infrahilar region with the postobstructive changes in the right middle lobe, a primary bronchogenic carcinoma in the central right middle lobe is suspected. 2. Colonic diverticulosis without evidence of acute diverticulitis at this time. 3. 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 4. Severe prostatomegaly. 5. Aortic atherosclerosis, in addition to left main and 3 vessel coronary artery disease. Assessment for potential risk factor modification, dietary therapy or pharmacologic therapy may be warranted, if clinically indicated. 6. Additional incidental findings, as above. Electronically Signed   By: Vinnie Langton M.D.   On:  03/09/2020 14:45   DG Chest Port 1 View  Result Date: 03/11/2020 CLINICAL DATA:  Dyspnea, former smoker,  history of pneumonia EXAM: PORTABLE CHEST 1 VIEW COMPARISON:  03/09/2020 chest radiograph. FINDINGS: Stable cardiomediastinal silhouette with normal heart size. No pneumothorax. Stable mild blunting of the right costophrenic angle suggesting small pleural effusion. No left pleural effusion. Worsening masslike consolidation in right infrahilar and right lower lung. Worsening hazy opacities in the left lung. Nodular opacity again noted in the peripheral right lower lobe. IMPRESSION: 1. Worsening masslike consolidation in the right infrahilar and right lower lung, likely a combination of pulmonary neoplasm and worsening postobstructive pneumonia as described on recent chest CT from 03/09/2020. 2. Nodular opacity in the peripheral right lower lung compatible with known pulmonary nodule on recent chest CT. 3. Worsening hazy opacities in the left lung, also suspicious for pneumonia versus lymphangitic tumor. Electronically Signed   By: Ilona Sorrel M.D.   On: 03/11/2020 15:23   DG Chest Port 1 View  Result Date: 03/04/2020 CLINICAL DATA:  Pt presents to ED via POV with c/o cough, and SOB. Pt states when he coughs he has substernal CP. Upon arrival to ED pt's RA sats 85-87% on RA. Pt also c/o dyspnea on exertion. Pt placed on 2L via [place] at this time. EXAM: PORTABLE CHEST 1 VIEW COMPARISON:  Chest radiograph 03/28/2018 FINDINGS: Stable cardiomediastinal contours. The heart size. Lungs are hyperinflated. There are new infiltrates throughout the bilateral lower lungs concerning for infection. Background chronic bronchitic change. No pneumothorax or significant pleural effusion. No acute finding in the visualized skeleton. IMPRESSION: New infiltrates throughout the bilateral lower lungs concerning for infection, less likely edema. Recommend radiographic follow-up to resolution. Electronically Signed   By: Audie Pinto M.D.   On: 03/04/2020 14:12   ECHOCARDIOGRAM COMPLETE  Result Date: 03/12/2020    ECHOCARDIOGRAM  REPORT   Patient Name:   Keiston Mccrumb Date of Exam: 03/12/2020 Medical Rec #:  [id number]   Height:       67.0 in Accession #:    [id number]  Weight:       145.0 lb Date of Birth:  [birth year]  BSA:          1.764 m Patient Age:    17 years    BP:           152/98 mmHg Patient Gender: M           HR:           72 bpm. Exam Location:  [facility] Procedure: 2D Echo, Color Doppler and Cardiac Doppler Indications:     I31.3 Pericardial effusion  History:         Patient has prior history of Echocardiogram examinations. Risk                  Factors:Hypertension.  Sonographer:     Charmayne Sheer RDCS (AE) Referring Phys:  [id number] Tyler Pita Diagnosing Phys: Kathlyn Sacramento MD  Sonographer Comments: Suboptimal subcostal window. TDS due to inability of pt to stay in position on left side. IMPRESSIONS  1. Left ventricular ejection fraction, by estimation, is 55 to 60%. The left ventricle has normal function. The left ventricle has no regional wall motion abnormalities. There is mild left ventricular hypertrophy. Left ventricular diastolic parameters are consistent with Grade I diastolic dysfunction (impaired relaxation).  2. Right ventricular systolic function is normal. The right ventricular size is normal. Tricuspid regurgitation signal is inadequate for assessing PA pressure.  3. The  mitral valve is normal in structure. Mild mitral valve regurgitation. No evidence of mitral stenosis.  4. The aortic valve is normal in structure. Aortic valve regurgitation is not visualized. Mild aortic valve sclerosis is present, with no evidence of aortic valve stenosis.  5. The inferior vena cava is normal in size with <50% respiratory variability, suggesting right atrial pressure of 8 mmHg. FINDINGS  Left Ventricle: Left ventricular ejection fraction, by estimation, is 55 to 60%. The left ventricle has normal function. The left ventricle has no regional wall motion abnormalities. The left ventricular internal cavity size was normal in size.  There is  mild left ventricular hypertrophy. Left ventricular diastolic parameters are consistent with Grade I diastolic dysfunction (impaired relaxation). Right Ventricle: The right ventricular size is normal. No increase in right ventricular wall thickness. Right ventricular systolic function is normal. Tricuspid regurgitation signal is inadequate for assessing PA pressure. Left Atrium: Left atrial size was normal in size. Right Atrium: Right atrial size was normal in size. Pericardium: A small pericardial effusion is present. The pericardial effusion is circumferential. There is no evidence of cardiac tamponade. Mitral Valve: The mitral valve is normal in structure. Normal mobility of the mitral valve leaflets. Mild mitral valve regurgitation. No evidence of mitral valve stenosis. MV peak gradient, 7.0 mmHg. The mean mitral valve gradient is 2.0 mmHg. Tricuspid Valve: The tricuspid valve is normal in structure. Tricuspid valve regurgitation is not demonstrated. No evidence of tricuspid stenosis. Aortic Valve: The aortic valve is normal in structure. Aortic valve regurgitation is not visualized. Mild aortic valve sclerosis is present, with no evidence of aortic valve stenosis. Aortic valve mean gradient measures 2.0 mmHg. Aortic valve peak gradient measures 3.5 mmHg. Aortic valve area, by VTI measures 2.04 cm. Pulmonic Valve: The pulmonic valve was normal in structure. Pulmonic valve regurgitation is trivial. No evidence of pulmonic stenosis. Aorta: The aortic root is normal in size and structure. Venous: The inferior vena cava is normal in size with less than 50% respiratory variability, suggesting right atrial pressure of 8 mmHg. IAS/Shunts: No atrial level shunt detected by color flow Doppler.  LEFT VENTRICLE PLAX 2D LVIDd:         4.34 cm  Diastology LVIDs:         3.04 cm  LV e' lateral:   4.46 cm/s LV PW:         1.10 cm  LV E/e' lateral: 16.6 LV IVS:        0.94 cm  LV e' medial:    3.59 cm/s LVOT diam:      2.00 cm  LV E/e' medial:  20.7 LV SV:         36 LV SV Index:   20 LVOT Area:     3.14 cm  LEFT ATRIUM         Index LA diam:    3.50 cm 1.98 cm/m  AORTIC VALVE                   PULMONIC VALVE AV Area (Vmax):    2.30 cm    PV Vmax:       0.85 m/s AV Area (Vmean):   2.22 cm    PV Vmean:      59.500 cm/s AV Area (VTI):     2.04 cm    PV VTI:        0.148 m AV Vmax:           93.90 cm/s  PV Peak grad:  2.9 mmHg AV Vmean:          58.700 cm/s PV Mean grad:  2.0 mmHg AV VTI:            0.177 m AV Peak Grad:      3.5 mmHg AV Mean Grad:      2.0 mmHg LVOT Vmax:         68.70 cm/s LVOT Vmean:        41.500 cm/s LVOT VTI:          0.115 m LVOT/AV VTI ratio: 0.65  AORTA Ao Root diam: 3.20 cm MITRAL VALVE MV Area (PHT): 2.99 cm    SHUNTS MV Peak grad:  7.0 mmHg    Systemic VTI:  0.12 m MV Mean grad:  2.0 mmHg    Systemic Diam: 2.00 cm MV Vmax:       1.32 m/s MV Vmean:      56.1 cm/s MV Decel Time: 254 msec MV E velocity: 74.20 cm/s MV A velocity: 98.50 cm/s MV E/A ratio:  0.75 Kathlyn Sacramento MD Electronically signed by Kathlyn Sacramento MD Signature Date/Time: 03/12/2020/3:00:50 PM    Final    [place] CORE BIOPSY (SOFT TISSUE)  Result Date: 03/07/2020 INDICATION: 74 year old male with a history of pathologic supraclavicular adenopathy, likely metastatic lung cancer EXAM: ULTRASOUND-GUIDED BIOPSY RIGHT SUPRACLAVICULAR LYMPH NODE MEDICATIONS: None. ANESTHESIA/SEDATION: None. FLUOROSCOPY TIME:  None COMPLICATIONS: None PROCEDURE: Informed written consent was obtained from the patient after a thorough discussion of the procedural risks, benefits and alternatives. All questions were addressed. Maximal Sterile Barrier Technique was utilized including caps, mask, sterile gowns, sterile gloves, sterile drape, hand hygiene and skin antiseptic. A timeout was performed prior to the initiation of the procedure. Ultrasound survey was performed with images stored and sent to PACs. The neck was prepped with chlorhexidine in a sterile  fashion, and a sterile drape was applied covering the operative field. A sterile gown and sterile gloves were used for the procedure. Local anesthesia was provided with 1% Lidocaine. Ultrasound guidance was used to infiltrate the region with 1% lidocaine for local anesthesia. Multiple separate 18 gauge core needle biopsy were then acquired of the right supraclavicular lymph node using ultrasound guidance. Images were stored. Final image was stored after biopsy. Patient tolerated the procedure well and remained hemodynamically stable throughout. No complications were encountered and no significant blood loss was encounter IMPRESSION: Status post ultrasound-guided right supraclavicular lymph node biopsy. Signed, Dulcy Fanny. Dellia Nims, RPVI [facility] Radiology Electronically Signed   By: Corrie Mckusick D.O.   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  #Longstanding smoker  Recommendations:   #Patient has new diagnosis of metastatic lung cancer along with rib lesions-which unfortunately unfortunately complicates patient's respiratory illness-advanced COPD/postobstructive pneumonia.  I do long discussion with patient and his wife regarding incurable nature of his lung cancer.  Patient likely to die of his progressive malignancy.  Given his tenuous respiratory status/poor performance status-not a candidate for any systemic therapy.  I would recommend hospice.  #Patient/wife seem to understand the  gravity of the illness.  Patient/wife agreed with hospice.  Patient wanted to go home with hospice; however wife felt she was unable to take care of him at home.  Introduced palliative care services; [employer].  Josh had a long discussion the patient regarding pros and cons of each option.  No decisions made.  # DNR/DNI.   Thank you Dr.Williams for allowing me to participate in the care of your pleasant patient. Please do not hesitate to contact me with questions or concerns in the interim.  All questions were answered. The patient knows to call the clinic with any problems, questions or concerns.  # I reviewed the blood work- with the patient in detail; also reviewed the imaging independently [as summarized above]; and with the patient in detail. Discussed with Dr.Aleskerov      Cammie Sickle, MD 03/13/2020 4:55 PM

## 2020-03-14 LAB — CULTURE, BLOOD (ROUTINE X 2)
Culture: NO GROWTH
Culture: NO GROWTH

## 2020-03-14 MED ORDER — MORPHINE SULFATE 2 MG/ML IJ SOLN: 2.0000 mg | INTRAMUSCULAR | 0 refills | Status: AC | PRN

## 2020-03-14 MED ORDER — OXYCODONE HCL 10 MG PO TABS: 10.0000 mg | ORAL_TABLET | Freq: Four times a day (QID) | ORAL | 0 refills | Status: DC | PRN

## 2020-03-14 MED ORDER — METHYLPREDNISOLONE 4 MG PO TBPK: ORAL_TABLET | ORAL | 0 refills | Status: AC

## 2020-03-14 MED ORDER — IPRATROPIUM-ALBUTEROL 0.5-2.5 (3) MG/3ML IN SOLN: 3.0000 mL | RESPIRATORY_TRACT | 0 refills | Status: AC | PRN

## 2020-03-14 MED ORDER — BISACODYL 5 MG PO TBEC: 5.0000 mg | DELAYED_RELEASE_TABLET | Freq: Every day | ORAL | 0 refills | Status: AC | PRN

## 2020-03-14 NOTE — Progress Notes (Signed)
Pulmonary Medicine          Date: 03/14/2020,   MRN# [id number] Tripton Ned [id number]     AdmissionWeight: 65.8 kg                 CurrentWeight: 63.8 kg      CHIEF COMPLAINT:   Acute on chronic hypoxemic respiratory failure   SUBJECTIVE   Patient is sitting up in bed, states analgesia is adequate.    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    Patient will be seen by palliative care today for goals of care - overall very unfortunate clinical scenario with poor prognosis.    PAST MEDICAL HISTORY   Past Medical History:  Diagnosis Date  . Alcohol abuse    quit 09/2014  . Arthritis   . Hypertension   . Leukopenia    resolved when stopped drinking 09/2014  . Pneumonia      SURGICAL HISTORY   Past Surgical History:  Procedure Laterality Date  . APPENDECTOMY    . COLONOSCOPY N/A 11/09/2015   Procedure: COLONOSCOPY;  Surgeon: Josefine Class, MD;  Location: [facility] ENDOSCOPY;  Service: Endoscopy;  Laterality: N/A;     FAMILY HISTORY   Family History  Problem Relation Age of Onset  . COPD Sister      SOCIAL HISTORY   Social History   Tobacco Use  . Smoking status: Former Smoker    Packs/day: 0.10    Years: 40.00    Pack years: 4.00    Types: Cigarettes    Quit date: 03/07/2020    Years since quitting: 0.0  . Smokeless tobacco: Never Used  Substance Use Topics  . Alcohol use: Yes    Alcohol/week: 3.0 standard drinks    Types: 3 Shots of liquor per week    Comment: last drink was 3/10  . Drug use: No     MEDICATIONS    Home Medication:    Current Medication:  Current Facility-Administered Medications:  .  0.9 %  sodium chloride infusion, , Intravenous, PRN, Wyvonnia Dusky, MD, Stopped at 03/11/20 0920 .  acetaminophen (TYLENOL) tablet 650 mg, 650 mg, Oral, Q6H PRN, Sharion Settler, NP, 650 mg at 03/11/20 0551 .  benzonatate (TESSALON) capsule 200 mg, 200 mg, Oral, TID,  Wyvonnia Dusky, MD, 200 mg at 03/14/20 1104 .  bisacodyl (DULCOLAX) EC tablet 5 mg, 5 mg, Oral, Daily PRN, Wyvonnia Dusky, MD, 5 mg at 03/11/20 0836 .  budesonide (PULMICORT) nebulizer solution 0.5 mg, 0.5 mg, Nebulization, BID, Awilda Bill, NP, 0.5 mg at 03/14/20 0833 .  Chlorhexidine Gluconate Cloth 2 % PADS 6 each, 6 each, Topical, Daily, Wyvonnia Dusky, MD, 6 each at 03/13/20 2200 .  ipratropium-albuterol (DUONEB) 0.5-2.5 (3) MG/3ML nebulizer solution 3 mL, 3 mL, Nebulization, Q4H, Tyler Pita, MD, 3 mL at 03/14/20 1527 .  ipratropium-albuterol (DUONEB) 0.5-2.5 (3) MG/3ML nebulizer solution 3 mL, 3 mL, Nebulization, Q6H PRN, Melene Muller, Dreama Saa, NP .  LORazepam (ATIVAN) injection 1-2 mg, 1-2 mg, Intravenous, Q4H PRN, Borders, Vonna Kotyk R, NP .  methylPREDNISolone sodium succinate (SOLU-MEDROL) 125 mg/2 mL injection 60 mg, 60 mg, Intravenous, Q6H, Blakeney, Dana G, NP, 60 mg at 03/14/20 1459 .  metoprolol tartrate (LOPRESSOR) injection 5 mg, 5 mg, Intravenous, Q8H PRN, Wyvonnia Dusky, MD, 5 mg at 03/11/20 1341 .  metoprolol tartrate (LOPRESSOR) tablet 25 mg, 25 mg, Oral, BID, Wyvonnia Dusky, MD, 25 mg at 03/14/20  1104 .  morphine 2 MG/ML injection 2-4 mg, 2-4 mg, Intravenous, Q1H PRN, Borders, Vonna Kotyk R, NP, 2 mg at 03/14/20 1500 .  ondansetron (ZOFRAN) tablet 4 mg, 4 mg, Oral, Q6H PRN **OR** ondansetron (ZOFRAN) injection 4 mg, 4 mg, Intravenous, Q6H PRN, Wyvonnia Dusky, MD, 4 mg at 03/12/20 2035 .  oxyCODONE (Oxy IR/ROXICODONE) immediate release tablet 10 mg, 10 mg, Oral, Q6H PRN, Wyvonnia Dusky, MD, 10 mg at 03/12/20 0957 .  sodium chloride flush (NS) 0.9 % injection 3 mL, 3 mL, Intravenous, Q12H, Wyvonnia Dusky, MD, 3 mL at 03/13/20 2130    ALLERGIES   Patient has no known allergies.     REVIEW OF SYSTEMS    Review of Systems:  Gen:  Denies  fever, sweats, chills weigh loss  HEENT: Denies blurred vision, double vision, ear pain, eye  pain, hearing loss, nose bleeds, sore throat Cardiac:  No dizziness, chest pain or heaviness, chest tightness,edema Resp:   Denies cough or sputum porduction, shortness of breath,wheezing, hemoptysis,  Gi: Denies swallowing difficulty, stomach pain, nausea or vomiting, diarrhea, constipation, bowel incontinence Gu:  Denies bladder incontinence, burning urine Ext:   Denies Joint pain, stiffness or swelling Skin: Denies  skin rash, easy bruising or bleeding or hives Endoc:  Denies polyuria, polydipsia , polyphagia or weight change Psych:   Denies depression, insomnia or hallucinations   Other:  All other systems negative   VS: BP (!) 143/76   Pulse 93   Temp 97.9 F (36.6 C) (Oral)   Resp 18   Ht 5\' 7"  (1.702 m)   Wt 63.8 kg   SpO2 95%   BMI 22.03 kg/m      PHYSICAL EXAM    GENERAL:NAD, no fevers, chills, no weakness no fatigue HEAD: Normocephalic, atraumatic.  EYES: Pupils equal, round, reactive to light. Extraocular muscles intact. No scleral icterus.  MOUTH: Moist mucosal membrane. Dentition intact. No abscess noted.  EAR, NOSE, THROAT: Clear without exudates. No external lesions.  NECK: Supple. No thyromegaly. No nodules. No JVD.  PULMONARY: decreased BS bilaterally CARDIOVASCULAR: S1 and S2. Regular rate and rhythm. No murmurs, rubs, or gallops. No edema. Pedal pulses 2+ bilaterally.  GASTROINTESTINAL: Soft, nontender, nondistended. No masses. Positive bowel sounds. No hepatosplenomegaly.  MUSCULOSKELETAL: No swelling, clubbing, or edema. Range of motion full in all extremities.  NEUROLOGIC: Cranial nerves II through XII are intact. No gross focal neurological deficits. Sensation intact. Reflexes intact.  SKIN: No ulceration, lesions, rashes, or cyanosis. Skin warm and dry. Turgor intact.  PSYCHIATRIC: Mood, affect within normal limits. The patient is awake, alert and oriented x 3. Insight, judgment intact.       IMAGING    DG Chest 2 View  Result Date:  03/09/2020 CLINICAL DATA:  Follow-up pneumonia. EXAM: CHEST - 2 VIEW COMPARISON:  03/04/2020 FINDINGS: UPPER limits normal heart size again noted. Bilateral airspace opacities are again noted, with increased RIGHT middle lobe consolidation/atelectasis. A trace RIGHT pleural effusion is noted. No pneumothorax or acute bony abnormality. IMPRESSION: Bilateral airspace opacities again noted with increased RIGHT middle lobe consolidation/atelectasis. Electronically Signed   By: Margarette Canada M.D.   On: 03/09/2020 13:12   CT Chest W Contrast  Result Date: 03/09/2020 CLINICAL DATA:  74 year old male with history of pneumonia. Mid right back pain. Swelling in the right flank. EXAM: CT CHEST, ABDOMEN, AND PELVIS WITH CONTRAST TECHNIQUE: Multidetector CT imaging of the chest, abdomen and pelvis was performed following the standard protocol during bolus administration of intravenous  contrast. CONTRAST:  14mL OMNIPAQUE IOHEXOL 300 MG/ML  SOLN COMPARISON:  Chest CT 03/05/2020. CT the abdomen and pelvis 05/01/2004. FINDINGS: CT CHEST FINDINGS Cardiovascular: Heart size is normal. Small amount of pericardial fluid and/or thickening, slightly increased compared to the prior study, but unlikely to be of hemodynamic significance at this time. Atherosclerotic calcifications in the thoracic aorta as well as the left main, left anterior descending, left circumflex and right coronary arteries. Mediastinum/Nodes: Multiple enlarged mediastinal and hilar lymph nodes bilaterally, bulkiest of which is in the low right paratracheal nodal station measuring 2.3 cm in short axis, low left paratracheal nodal station measuring 2.8 cm in short axis, and AP window measuring 1.9 cm in short axis. Hilar lymph nodes measure up to 2 cm in short axis. Esophagus is unremarkable in appearance. Esophagus is unremarkable in appearance. No axillary lymphadenopathy. Lungs/Pleura: Mass-like enlargement of the right infrahilar region best appreciated on axial  image 40 of series 504 estimated to measure approximately 4.6 x 4.3 cm with postobstructive changes in the right middle lobe which appears to reflect a combination of consolidation and atelectasis. Multiple other pulmonary nodules are again noted throughout the lungs bilaterally, largest of which is in the right lower lobe measuring 2.5 x 2.1 cm (axial image 126 of series 505). Musculoskeletal: Enumerable lytic lesions throughout the visualized axial and appendicular skeleton, compatible with widespread metastatic disease to the bones. CT ABDOMEN PELVIS FINDINGS Hepatobiliary: 1.5 x 1.3 cm centrally low-attenuation lesion with some peripheral nodular enhancement and progressive centripetal filling in segment 3 of the liver, compatible with a cavernous hemangioma. No other suspicious hepatic lesions. No intra or extrahepatic biliary ductal dilatation. Gallbladder is normal in appearance. Pancreas: No pancreatic mass. No pancreatic ductal dilatation. No pancreatic or peripancreatic fluid collections or inflammatory changes. Spleen: Unremarkable. Adrenals/Urinary Tract: Bilateral kidneys and adrenal glands are normal in appearance. No hydroureteronephrosis. Urinary bladder is normal in appearance. Stomach/Bowel: Normal appearance of the stomach. No pathologic dilatation of small bowel or colon. Several colonic diverticulae are noted, without surrounding inflammatory changes to suggest an acute diverticulitis at this time. The appendix is not confidently identified and may be surgically absent. Regardless, there are no inflammatory changes noted adjacent to the cecum to suggest the presence of an acute appendicitis at this time. Vascular/Lymphatic: Aortic atherosclerosis, without evidence of aneurysm or dissection noted in the abdominal or pelvic vasculature. No lymphadenopathy noted in the abdomen or pelvis. Reproductive: Prostate gland is enlarged and heterogeneous in appearance measuring 5.8 x 4.3 x 6.1 cm. Other:  Small volume of ascites.  No pneumoperitoneum. Musculoskeletal: Numerous lytic osseous lesions are noted throughout the visualized axial and appendicular structures, highly concerning for widespread metastatic disease to the bones. IMPRESSION: 1. 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 The primary malignancy is not confidently identified on today's examination, however, given the mass-like appearance of the right infrahilar region with the postobstructive changes in the right middle lobe, a primary bronchogenic carcinoma in the central right middle lobe is suspected. 2. Colonic diverticulosis without evidence of acute diverticulitis at this time. 3. 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 4. Severe prostatomegaly. 5. Aortic atherosclerosis, in addition to left main and 3 vessel coronary artery disease. Assessment for potential risk factor modification, dietary therapy or pharmacologic therapy may be warranted, if clinically indicated. 6. Additional incidental findings, as above. Electronically Signed  By: Vinnie Langton M.D.   On: 03/09/2020 14:45   CT ANGIO CHEST PE W OR WO CONTRAST  Result Date: 03/05/2020 CLINICAL DATA:  Shortness of breath bilateral pneumonia, elevated D-dimer EXAM: CT ANGIOGRAPHY CHEST WITH CONTRAST TECHNIQUE: Multidetector CT imaging of the chest was performed using the standard protocol during bolus administration of intravenous contrast. Multiplanar CT image reconstructions and MIPs were obtained to evaluate the vascular anatomy. CONTRAST:  26mL OMNIPAQUE IOHEXOL 350 MG/ML SOLN COMPARISON:  Chest x-ray of 03/04/2020 FINDINGS: Cardiovascular: Calcified and noncalcified plaque throughout the thoracic aorta. No signs of acute aortic process. Small  pericardial effusion. Heart size is normal. No signs of acute pulmonary embolism. Mediastinum/Nodes: Bulky mediastinal adenopathy. (Image 39, series 4): 2.9 cm AP window lymph node. (Image 38, series 4): 2.4 cm right paratracheal lymph node. Lymph nodes are seen throughout all nodal stations in the chest including the bilateral hila. Right hilar nodal tissue approximately 1.7 cm (image 50, series 4) anterior mediastinal/prevascular lymph nodes with enlargement as well. High AP window lymph node just below pre-vascular lymph nodes (image 39, series 4) 1.8 cm. Esophagus is grossly normal. No signs of frank absolute ir E adenopathy. 17 mm left thoracic inlet lymph node and similarly sized right thoracic inlet lymph nodes. Ovoid lymph node without fatty hilum in the left axilla measuring 10 mm. Lungs/Pleura: Nodular opacities throughout the chest with a background of ground-glass attenuation and septal thickening, largest area measuring 2.3 x 2.1 cm in the right lower lobe this shows low-density. Consolidative changes and volume loss in the right middle lobe. Narrowing of central airways at the right hilum also in the left infrahilar region. Patchy opacities bilaterally throughout the chest with nodular features none as discrete as the area in the right lung base. Small right pleural effusion layers dependently. Upper Abdomen: No signs of acute finding in the upper abdomen. Musculoskeletal: Numerous areas of lytic change throughout the visualized skeleton, particularly the spine. These are too numerous to count with a focal area as an example on image 23 of series 4 measuring 11 mm. No signs of acute fracture. Review of the MIP images confirms the above findings. IMPRESSION: 1. No evidence of acute pulmonary embolism. 2. Bulky mediastinal and bilateral hilar adenopathy with nodular opacities throughout the chest and numerous areas of lytic change throughout the visualized skeleton. Findings concerning for primary  pulmonary neoplasm or lymphoproliferative disorder with diffuse involvement/metastatic disease and with superimposed infection in the chest. 3. There was some mild nodal enlargement on previous exams. This is much worse and findings of diffuse bony lytic changes are also a new finding. 4. Some airway narrowing at the hila bilaterally worse on the right with some mild bronchial wall thickening. Superimposed infection may be due to postobstructive changes. 5. Small right pleural effusion. 6. Small pericardial effusion. 7. Aortic atherosclerosis. Aortic Atherosclerosis (ICD10-I70.0). Electronically Signed   By: Zetta Bills M.D.   On: 03/05/2020 13:27   CT Abdomen Pelvis W Contrast  Result Date: 03/09/2020 CLINICAL DATA:  74 year old male with history of pneumonia. Mid right back pain. Swelling in the right flank. EXAM: CT CHEST, ABDOMEN, AND PELVIS WITH CONTRAST TECHNIQUE: Multidetector CT imaging of the chest, abdomen and pelvis was performed following the standard protocol during bolus administration of intravenous contrast. CONTRAST:  69mL OMNIPAQUE IOHEXOL 300 MG/ML  SOLN COMPARISON:  Chest CT 03/05/2020. CT the abdomen and pelvis 05/01/2004. FINDINGS: CT CHEST FINDINGS Cardiovascular: Heart size is normal. Small amount of pericardial fluid and/or thickening, slightly increased  compared to the prior study, but unlikely to be of hemodynamic significance at this time. Atherosclerotic calcifications in the thoracic aorta as well as the left main, left anterior descending, left circumflex and right coronary arteries. Mediastinum/Nodes: Multiple enlarged mediastinal and hilar lymph nodes bilaterally, bulkiest of which is in the low right paratracheal nodal station measuring 2.3 cm in short axis, low left paratracheal nodal station measuring 2.8 cm in short axis, and AP window measuring 1.9 cm in short axis. Hilar lymph nodes measure up to 2 cm in short axis. Esophagus is unremarkable in appearance. Esophagus is  unremarkable in appearance. No axillary lymphadenopathy. Lungs/Pleura: Mass-like enlargement of the right infrahilar region best appreciated on axial image 40 of series 504 estimated to measure approximately 4.6 x 4.3 cm with postobstructive changes in the right middle lobe which appears to reflect a combination of consolidation and atelectasis. Multiple other pulmonary nodules are again noted throughout the lungs bilaterally, largest of which is in the right lower lobe measuring 2.5 x 2.1 cm (axial image 126 of series 505). Musculoskeletal: Enumerable lytic lesions throughout the visualized axial and appendicular skeleton, compatible with widespread metastatic disease to the bones. CT ABDOMEN PELVIS FINDINGS Hepatobiliary: 1.5 x 1.3 cm centrally low-attenuation lesion with some peripheral nodular enhancement and progressive centripetal filling in segment 3 of the liver, compatible with a cavernous hemangioma. No other suspicious hepatic lesions. No intra or extrahepatic biliary ductal dilatation. Gallbladder is normal in appearance. Pancreas: No pancreatic mass. No pancreatic ductal dilatation. No pancreatic or peripancreatic fluid collections or inflammatory changes. Spleen: Unremarkable. Adrenals/Urinary Tract: Bilateral kidneys and adrenal glands are normal in appearance. No hydroureteronephrosis. Urinary bladder is normal in appearance. Stomach/Bowel: Normal appearance of the stomach. No pathologic dilatation of small bowel or colon. Several colonic diverticulae are noted, without surrounding inflammatory changes to suggest an acute diverticulitis at this time. The appendix is not confidently identified and may be surgically absent. Regardless, there are no inflammatory changes noted adjacent to the cecum to suggest the presence of an acute appendicitis at this time. Vascular/Lymphatic: Aortic atherosclerosis, without evidence of aneurysm or dissection noted in the abdominal or pelvic vasculature. No  lymphadenopathy noted in the abdomen or pelvis. Reproductive: Prostate gland is enlarged and heterogeneous in appearance measuring 5.8 x 4.3 x 6.1 cm. Other: Small volume of ascites.  No pneumoperitoneum. Musculoskeletal: Numerous lytic osseous lesions are noted throughout the visualized axial and appendicular structures, highly concerning for widespread metastatic disease to the bones. IMPRESSION: 1. 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 The primary malignancy is not confidently identified on today's examination, however, given the mass-like appearance of the right infrahilar region with the postobstructive changes in the right middle lobe, a primary bronchogenic carcinoma in the central right middle lobe is suspected. 2. Colonic diverticulosis without evidence of acute diverticulitis at this time. 3. 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 4. Severe prostatomegaly. 5. Aortic atherosclerosis, in addition to left main and 3 vessel coronary artery disease. Assessment for potential risk factor modification, dietary therapy or pharmacologic therapy may be warranted, if clinically indicated. 6. Additional incidental findings, as above. Electronically Signed   By: Vinnie Langton M.D.   On: 03/09/2020 14:45   DG Chest Port 1 View  Result Date: 03/11/2020 CLINICAL DATA:  Dyspnea, former smoker, history of pneumonia EXAM: PORTABLE CHEST 1 VIEW COMPARISON:  03/09/2020  chest radiograph. FINDINGS: Stable cardiomediastinal silhouette with normal heart size. No pneumothorax. Stable mild blunting of the right costophrenic angle suggesting small pleural effusion. No left pleural effusion. Worsening masslike consolidation in right infrahilar and right lower lung. Worsening hazy opacities  in the left lung. Nodular opacity again noted in the peripheral right lower lobe. IMPRESSION: 1. Worsening masslike consolidation in the right infrahilar and right lower lung, likely a combination of pulmonary neoplasm and worsening postobstructive pneumonia as described on recent chest CT from 03/09/2020. 2. Nodular opacity in the peripheral right lower lung compatible with known pulmonary nodule on recent chest CT. 3. Worsening hazy opacities in the left lung, also suspicious for pneumonia versus lymphangitic tumor. Electronically Signed   By: Ilona Sorrel M.D.   On: 03/11/2020 15:23   DG Chest Port 1 View  Result Date: 03/04/2020 CLINICAL DATA:  Pt presents to ED via POV with c/o cough, and SOB. Pt states when he coughs he has substernal CP. Upon arrival to ED pt's RA sats 85-87% on RA. Pt also c/o dyspnea on exertion. Pt placed on 2L via [place] at this time. EXAM: PORTABLE CHEST 1 VIEW COMPARISON:  Chest radiograph 03/28/2018 FINDINGS: Stable cardiomediastinal contours. The heart size. Lungs are hyperinflated. There are new infiltrates throughout the bilateral lower lungs concerning for infection. Background chronic bronchitic change. No pneumothorax or significant pleural effusion. No acute finding in the visualized skeleton. IMPRESSION: New infiltrates throughout the bilateral lower lungs concerning for infection, less likely edema. Recommend radiographic follow-up to resolution. Electronically Signed   By: Audie Pinto M.D.   On: 03/04/2020 14:12   ECHOCARDIOGRAM COMPLETE  Result Date: 03/12/2020    ECHOCARDIOGRAM REPORT   Patient Name:   Kal Shimon Date of Exam: 03/12/2020 Medical Rec #:  [id number]   Height:       67.0 in Accession #:    [id number]  Weight:       145.0 lb Date of Birth:  Apr 03, 1946  BSA:          1.764 m Patient Age:    62 years    BP:           152/98 mmHg Patient Gender: M           HR:           72 bpm. Exam Location:  [facility] Procedure: 2D Echo, Color Doppler and Cardiac Doppler  Indications:     I31.3 Pericardial effusion  History:         Patient has prior history of Echocardiogram examinations. Risk                  Factors:Hypertension.  Sonographer:     Charmayne Sheer RDCS (AE) Referring Phys:  [id number] Tyler Pita Diagnosing Phys: Kathlyn Sacramento MD  Sonographer Comments: Suboptimal subcostal window. TDS due to inability of pt to stay in position on left side. IMPRESSIONS  1. Left ventricular ejection fraction, by estimation, is 55 to 60%. The left ventricle has normal function. The left ventricle has no regional wall motion abnormalities. There is mild left ventricular hypertrophy. Left ventricular diastolic parameters are consistent with Grade I diastolic dysfunction (impaired relaxation).  2. Right ventricular systolic function is normal. The right ventricular size is normal. Tricuspid regurgitation signal is inadequate for assessing PA pressure.  3. The mitral valve is normal in structure. Mild mitral valve regurgitation. No evidence of mitral stenosis.  4. The aortic valve is normal in structure. Aortic valve regurgitation is not visualized.  Mild aortic valve sclerosis is present, with no evidence of aortic valve stenosis.  5. The inferior vena cava is normal in size with <50% respiratory variability, suggesting right atrial pressure of 8 mmHg. FINDINGS  Left Ventricle: Left ventricular ejection fraction, by estimation, is 55 to 60%. The left ventricle has normal function. The left ventricle has no regional wall motion abnormalities. The left ventricular internal cavity size was normal in size. There is  mild left ventricular hypertrophy. Left ventricular diastolic parameters are consistent with Grade I diastolic dysfunction (impaired relaxation). Right Ventricle: The right ventricular size is normal. No increase in right ventricular wall thickness. Right ventricular systolic function is normal. Tricuspid regurgitation signal is inadequate for assessing PA pressure. Left Atrium: Left  atrial size was normal in size. Right Atrium: Right atrial size was normal in size. Pericardium: A small pericardial effusion is present. The pericardial effusion is circumferential. There is no evidence of cardiac tamponade. Mitral Valve: The mitral valve is normal in structure. Normal mobility of the mitral valve leaflets. Mild mitral valve regurgitation. No evidence of mitral valve stenosis. MV peak gradient, 7.0 mmHg. The mean mitral valve gradient is 2.0 mmHg. Tricuspid Valve: The tricuspid valve is normal in structure. Tricuspid valve regurgitation is not demonstrated. No evidence of tricuspid stenosis. Aortic Valve: The aortic valve is normal in structure. Aortic valve regurgitation is not visualized. Mild aortic valve sclerosis is present, with no evidence of aortic valve stenosis. Aortic valve mean gradient measures 2.0 mmHg. Aortic valve peak gradient measures 3.5 mmHg. Aortic valve area, by VTI measures 2.04 cm. Pulmonic Valve: The pulmonic valve was normal in structure. Pulmonic valve regurgitation is trivial. No evidence of pulmonic stenosis. Aorta: The aortic root is normal in size and structure. Venous: The inferior vena cava is normal in size with less than 50% respiratory variability, suggesting right atrial pressure of 8 mmHg. IAS/Shunts: No atrial level shunt detected by color flow Doppler.  LEFT VENTRICLE PLAX 2D LVIDd:         4.34 cm  Diastology LVIDs:         3.04 cm  LV e' lateral:   4.46 cm/s LV PW:         1.10 cm  LV E/e' lateral: 16.6 LV IVS:        0.94 cm  LV e' medial:    3.59 cm/s LVOT diam:     2.00 cm  LV E/e' medial:  20.7 LV SV:         36 LV SV Index:   20 LVOT Area:     3.14 cm  LEFT ATRIUM         Index LA diam:    3.50 cm 1.98 cm/m  AORTIC VALVE                   PULMONIC VALVE AV Area (Vmax):    2.30 cm    PV Vmax:       0.85 m/s AV Area (Vmean):   2.22 cm    PV Vmean:      59.500 cm/s AV Area (VTI):     2.04 cm    PV VTI:        0.148 m AV Vmax:           93.90 cm/s   PV Peak grad:  2.9 mmHg AV Vmean:          58.700 cm/s PV Mean grad:  2.0 mmHg AV VTI:  0.177 m AV Peak Grad:      3.5 mmHg AV Mean Grad:      2.0 mmHg LVOT Vmax:         68.70 cm/s LVOT Vmean:        41.500 cm/s LVOT VTI:          0.115 m LVOT/AV VTI ratio: 0.65  AORTA Ao Root diam: 3.20 cm MITRAL VALVE MV Area (PHT): 2.99 cm    SHUNTS MV Peak grad:  7.0 mmHg    Systemic VTI:  0.12 m MV Mean grad:  2.0 mmHg    Systemic Diam: 2.00 cm MV Vmax:       1.32 m/s MV Vmean:      56.1 cm/s MV Decel Time: 254 msec MV E velocity: 74.20 cm/s MV A velocity: 98.50 cm/s MV E/A ratio:  0.75 Kathlyn Sacramento MD Electronically signed by Kathlyn Sacramento MD Signature Date/Time: 03/12/2020/3:00:50 PM    Final    [place] CORE BIOPSY (SOFT TISSUE)  Result Date: 03/07/2020 INDICATION: 74 year old male with a history of pathologic supraclavicular adenopathy, likely metastatic lung cancer EXAM: ULTRASOUND-GUIDED BIOPSY RIGHT SUPRACLAVICULAR LYMPH NODE MEDICATIONS: None. ANESTHESIA/SEDATION: None. FLUOROSCOPY TIME:  None COMPLICATIONS: None PROCEDURE: Informed written consent was obtained from the patient after a thorough discussion of the procedural risks, benefits and alternatives. All questions were addressed. Maximal Sterile Barrier Technique was utilized including caps, mask, sterile gowns, sterile gloves, sterile drape, hand hygiene and skin antiseptic. A timeout was performed prior to the initiation of the procedure. Ultrasound survey was performed with images stored and sent to PACs. The neck was prepped with chlorhexidine in a sterile fashion, and a sterile drape was applied covering the operative field. A sterile gown and sterile gloves were used for the procedure. Local anesthesia was provided with 1% Lidocaine. Ultrasound guidance was used to infiltrate the region with 1% lidocaine for local anesthesia. Multiple separate 18 gauge core needle biopsy were then acquired of the right supraclavicular lymph node using ultrasound  guidance. Images were stored. Final image was stored after biopsy. Patient tolerated the procedure well and remained hemodynamically stable throughout. No complications were encountered and no significant blood loss was encounter IMPRESSION: Status post ultrasound-guided right supraclavicular lymph node biopsy. Signed, Dulcy Fanny. Dellia Nims, RPVI [facility] Electronically Signed   By: Corrie Mckusick D.O.   On: 03/07/2020 08:42      ASSESSMENT/PLAN   Acute on chronic hypoxemic respiratory failure    -patient with severe centrilobular emphysema and Stage 4 lung adenocarcinoma   -goals of care discourse yesterday with patient and wife - currently no aggressive measures -decrease Solumedrol from 80 to 60 bid -d/c vancomycin due to negative MRSA pcr -continue zosyn for 5-7d CAP course -appreciate PALS team - patient on morphine -chest PT  -goals of care with possible comfort measures   Multiple co-morbid conditions  - defer to hospitalist team    Thank you for allowing me to participate in the care of this patient.   This document was prepared using Dragon voice recognition software and may include unintentional dictation errors.  Critical care provider statement:    Critical care time (minutes):  33   Critical care time was exclusive of:  Separately billable procedures and  treating other patients   Critical care was necessary to treat or prevent imminent or  life-threatening deterioration of the following conditions:  acute on chronic hypoxemic respiratory failure, lung cancer, emphysema, multiple comorbid conditions   Critical  care was time spent personally by me on the following  activities:  Development of treatment plan with patient or surrogate,  discussions with consultants, evaluation of patient's response to  treatment, examination of patient, obtaining history from patient or  surrogate, ordering and performing  treatments and interventions, ordering  and review of laboratory studies and re-evaluation of patient's condition   I assumed direction of critical care for this patient from another  provider in my specialty: no        Ottie Glazier, M.D.  Division of [place]

## 2020-03-14 NOTE — Progress Notes (Signed)
Patient discharged to [facility] via [facility]. Left with cell phone, no other belongings present. Left 22g IV to right FA in place for transport. Wife Antoniette notified of patient leaving headed to [facility] at this time.

## 2020-03-14 NOTE — Progress Notes (Signed)
Follow up visit made to new referral for [facility]. Plan is for transfer today. Consents are completed. EMS called for transport, report called to the hospice home. Hospital care team all aware. Thank you. Flo Shanks BSN, RN, [place] [phone]

## 2020-03-14 NOTE — Discharge Summary (Signed)
Physician Discharge Summary  Cyncere Sontag CHE:[id number] DOB: October 06, 1946 DOA: 03/09/2020  PCP: Theotis Burrow, MD  Admit date: 03/09/2020 Discharge date: 03/14/2020  Time spent: 30 minutes  Recommendations for Outpatient Follow-up:  1. PCP in ine week. 2. (include homehealth, outpatient follow-up instructions, specific recommendations for PCP to follow-up on, etc.)   Discharge Diagnoses:  Principal Problem:   Postobstructive pneumonia Active Problems:   Primary cancer of right middle lobe of lung ([place])   Discharge Condition: stable Diet recommendation: Regular.   Filed Weights   03/09/20 1204 03/13/20 0500  Weight: 65.8 kg 63.8 kg    History of present illness:  74 y/o M w/ PMH of HTN who presents shortness of breath & back pain x day of admission. The shortness of breath is at rest as well as with exertion. The shortness of breath is worse with laying down. Of note, pt was just d/c from here on 03/07/20 w/ 2L [place]. Pt was also d/c w/ abxs, steroids for which he did fill as thought the medications were being sent to his "house." Pt also c/o cough. Furthermore, pt c/o lower back pain. The pain is dull, intermittent w/o radiation. Pain meds makes the pain better and nothing makes the pain worse. The severity is currently 4/10. Pt denies any fever, chills, sweating, dizziness, lightheadedness, chest pain, nausea, vomiting, abd pain, dysuria, urinary frequency, urinary urgency, diarrhea or constipation.  Hospital Course:  Pt admitted on 03/12/2020 for shortness of breath attributed to postobstructive pneumonia and started on iv cefepime and azithromycin and supportive care , With palliative care consult. Ct scan shows multiple lytic lesions on axial and appendicular skeleton from his malignancy. On 03/11/2020 pt was seen by intensivist  For  A.fib rvr and respiratory distress And pt did not tolerated bipap well. Pt was started on amiodarone and code status was made to DNR. Pt  continued to be stable and iv steroids and abx and supportive care with oxygen continued and plan of care changed to comfort measures per family decision.   Procedures:  N/A  Consultations: Intensivist .  Discharge Exam: Vitals:   03/14/20 1100 03/14/20 1125  BP: 126/69   Pulse: 86   Resp: 20   Temp:    SpO2: 94% 96%     Blood pressure 126/69, pulse 86, temperature 97.9 F (36.6 C), temperature source Oral, resp. rate 20, height 5\' 7"  (1.702 m), weight 63.8 kg, SpO2 96 %.  General exam: Appears calm and comfortable  Respiratory system: diminished breath sounds b/l Cardiovascular system: S1 & S2 +. No , rubs, gallops or clicks.  Gastrointestinal system: Abdomen is nondistended, soft and nontender. Hypoactive bowel sounds heard. Central nervous system: Alert and oriented to person, place. Moves all 4 extremities  Psychiatry: Judgement and insight appear abnormal. Flat mood and affect.   Discharge Instructions Please follow up with pcp for hospital follow up.   Discharge Instructions    Call MD for:  difficulty breathing, headache or visual disturbances   Complete by: As directed    Call MD for:  persistant nausea and vomiting   Complete by: As directed    Call MD for:  severe uncontrolled pain   Complete by: As directed    Call MD for:  temperature >100.4   Complete by: As directed    Diet - low sodium heart healthy   Complete by: As directed    Increase activity slowly   Complete by: As directed      Allergies as  of 03/14/2020   No Known Allergies     Medication List    STOP taking these medications   azithromycin 250 MG tablet Commonly known as: ZITHROMAX   levofloxacin 750 MG tablet Commonly known as: Levaquin   predniSONE 20 MG tablet Commonly known as: Deltasone     TAKE these medications   amLODipine 5 MG tablet Commonly known as: NORVASC Take 1 tablet (5 mg total) by mouth 2 (two) times daily.   aspirin 81 MG EC tablet Take 1 tablet (81 mg  total) by mouth daily.   benzonatate 100 MG capsule Commonly known as: TESSALON Take 1 capsule (100 mg total) by mouth 3 (three) times daily for 7 days.   bisacodyl 5 MG EC tablet Commonly known as: DULCOLAX Take 1 tablet (5 mg total) by mouth daily as needed for moderate constipation.   folic acid 1 MG tablet Commonly known as: FOLVITE Take 1 tablet (1 mg total) by mouth daily.   ipratropium-albuterol 0.5-2.5 (3) MG/3ML Soln Commonly known as: DUONEB Take 3 mLs by nebulization every 4 (four) hours as needed (shortness of breath.).   methylPREDNISolone 4 MG Tbpk tablet Commonly known as: MEDROL DOSEPAK Take by mouth as directed.   metoprolol tartrate 25 MG tablet Commonly known as: LOPRESSOR Take 1 tablet (25 mg total) by mouth 2 (two) times daily.   morphine 2 MG/ML injection Inject 1-2 mLs (2-4 mg total) into the vein every hour as needed (dyspnea).   multivitamin with minerals Tabs tablet Take 1 tablet by mouth daily.   naproxen 500 MG tablet Commonly known as: Naprosyn Take 1 tablet (500 mg total) by mouth 2 (two) times daily with a meal.      No Known Allergies Follow-up Information    Revelo, Elyse Jarvis, MD Follow up in 1 week(s).   Specialty: Family Medicine Contact information: [address] [place] [address] [phone]            The results of significant diagnostics from this hospitalization (including imaging, microbiology, ancillary and laboratory) are listed below for reference.    Significant Diagnostic Studies: DG Chest 2 View  Result Date: 03/09/2020 CLINICAL DATA:  Follow-up pneumonia. EXAM: CHEST - 2 VIEW COMPARISON:  03/04/2020 FINDINGS: UPPER limits normal heart size again noted. Bilateral airspace opacities are again noted, with increased RIGHT middle lobe consolidation/atelectasis. A trace RIGHT pleural effusion is noted. No pneumothorax or acute bony abnormality. IMPRESSION: Bilateral airspace opacities again noted  with increased RIGHT middle lobe consolidation/atelectasis. Electronically Signed   By: Margarette Canada M.D.   On: 03/09/2020 13:12   CT Chest W Contrast  Result Date: 03/09/2020 CLINICAL DATA:  74 year old male with history of pneumonia. Mid right back pain. Swelling in the right flank. EXAM: CT CHEST, ABDOMEN, AND PELVIS WITH CONTRAST TECHNIQUE: Multidetector CT imaging of the chest, abdomen and pelvis was performed following the standard protocol during bolus administration of intravenous contrast. CONTRAST:  22mL OMNIPAQUE IOHEXOL 300 MG/ML  SOLN COMPARISON:  Chest CT 03/05/2020. CT the abdomen and pelvis 05/01/2004. FINDINGS: CT CHEST FINDINGS Cardiovascular: Heart size is normal. Small amount of pericardial fluid and/or thickening, slightly increased compared to the prior study, but unlikely to be of hemodynamic significance at this time. Atherosclerotic calcifications in the thoracic aorta as well as the left main, left anterior descending, left circumflex and right coronary arteries. Mediastinum/Nodes: Multiple enlarged mediastinal and hilar lymph nodes bilaterally, bulkiest of which is in the low right paratracheal nodal station measuring 2.3 cm in  short axis, low left paratracheal nodal station measuring 2.8 cm in short axis, and AP window measuring 1.9 cm in short axis. Hilar lymph nodes measure up to 2 cm in short axis. Esophagus is unremarkable in appearance. Esophagus is unremarkable in appearance. No axillary lymphadenopathy. Lungs/Pleura: Mass-like enlargement of the right infrahilar region best appreciated on axial image 40 of series 504 estimated to measure approximately 4.6 x 4.3 cm with postobstructive changes in the right middle lobe which appears to reflect a combination of consolidation and atelectasis. Multiple other pulmonary nodules are again noted throughout the lungs bilaterally, largest of which is in the right lower lobe measuring 2.5 x 2.1 cm (axial image 126 of series 505).  Musculoskeletal: Enumerable lytic lesions throughout the visualized axial and appendicular skeleton, compatible with widespread metastatic disease to the bones. CT ABDOMEN PELVIS FINDINGS Hepatobiliary: 1.5 x 1.3 cm centrally low-attenuation lesion with some peripheral nodular enhancement and progressive centripetal filling in segment 3 of the liver, compatible with a cavernous hemangioma. No other suspicious hepatic lesions. No intra or extrahepatic biliary ductal dilatation. Gallbladder is normal in appearance. Pancreas: No pancreatic mass. No pancreatic ductal dilatation. No pancreatic or peripancreatic fluid collections or inflammatory changes. Spleen: Unremarkable. Adrenals/Urinary Tract: Bilateral kidneys and adrenal glands are normal in appearance. No hydroureteronephrosis. Urinary bladder is normal in appearance. Stomach/Bowel: Normal appearance of the stomach. No pathologic dilatation of small bowel or colon. Several colonic diverticulae are noted, without surrounding inflammatory changes to suggest an acute diverticulitis at this time. The appendix is not confidently identified and may be surgically absent. Regardless, there are no inflammatory changes noted adjacent to the cecum to suggest the presence of an acute appendicitis at this time. Vascular/Lymphatic: Aortic atherosclerosis, without evidence of aneurysm or dissection noted in the abdominal or pelvic vasculature. No lymphadenopathy noted in the abdomen or pelvis. Reproductive: Prostate gland is enlarged and heterogeneous in appearance measuring 5.8 x 4.3 x 6.1 cm. Other: Small volume of ascites.  No pneumoperitoneum. Musculoskeletal: Numerous lytic osseous lesions are noted throughout the visualized axial and appendicular structures, highly concerning for widespread metastatic disease to the bones. IMPRESSION: 1. Numerous pulmonary nodules in the lungs bilaterally, bilateral hilar and mediastinal lymphadenopathy, and innumerable lytic lesions  throughout the visualized axial and appendicular skeleton, indicative of malignancy with widespread metastatic disease. The primary malignancy is not confidently identified on today's examination, however, given the mass-like appearance of the right infrahilar region with the postobstructive changes in the right middle lobe, a primary bronchogenic carcinoma in the central right middle lobe is suspected. 2. Colonic diverticulosis without evidence of acute diverticulitis at this time. 3. 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 4. Severe prostatomegaly. 5. Aortic atherosclerosis, in addition to left main and 3 vessel coronary artery disease. Assessment for potential risk factor modification, dietary therapy or pharmacologic therapy may be warranted, if clinically indicated. 6. Additional incidental findings, as above. Electronically Signed   By: Vinnie Langton M.D.   On: 03/09/2020 14:45   CT ANGIO CHEST PE W OR WO CONTRAST  Result Date: 03/05/2020 CLINICAL DATA:  Shortness of breath bilateral pneumonia, elevated D-dimer EXAM: CT ANGIOGRAPHY CHEST WITH CONTRAST TECHNIQUE: Multidetector CT imaging of the chest was performed using the standard protocol during bolus administration of intravenous contrast. Multiplanar CT image reconstructions and MIPs were obtained to evaluate the vascular anatomy. CONTRAST:  77mL OMNIPAQUE IOHEXOL 350 MG/ML SOLN COMPARISON:  Chest x-ray of 03/04/2020 FINDINGS: Cardiovascular: Calcified and noncalcified plaque throughout the thoracic aorta. No signs  of acute aortic process. Small pericardial effusion. Heart size is normal. No signs of acute pulmonary embolism. Mediastinum/Nodes: Bulky mediastinal adenopathy. (Image 39, series 4): 2.9 cm AP window lymph node. (Image 38, series 4): 2.4 cm right paratracheal lymph node. Lymph nodes are seen throughout all nodal stations in the chest including the bilateral hila. Right hilar nodal tissue approximately 1.7 cm  (image 50, series 4) anterior mediastinal/prevascular lymph nodes with enlargement as well. High AP window lymph node just below pre-vascular lymph nodes (image 39, series 4) 1.8 cm. Esophagus is grossly normal. No signs of frank absolute ir E adenopathy. 17 mm left thoracic inlet lymph node and similarly sized right thoracic inlet lymph nodes. Ovoid lymph node without fatty hilum in the left axilla measuring 10 mm. Lungs/Pleura: Nodular opacities throughout the chest with a background of ground-glass attenuation and septal thickening, largest area measuring 2.3 x 2.1 cm in the right lower lobe this shows low-density. Consolidative changes and volume loss in the right middle lobe. Narrowing of central airways at the right hilum also in the left infrahilar region. Patchy opacities bilaterally throughout the chest with nodular features none as discrete as the area in the right lung base. Small right pleural effusion layers dependently. Upper Abdomen: No signs of acute finding in the upper abdomen. Musculoskeletal: Numerous areas of lytic change throughout the visualized skeleton, particularly the spine. These are too numerous to count with a focal area as an example on image 23 of series 4 measuring 11 mm. No signs of acute fracture. Review of the MIP images confirms the above findings. IMPRESSION: 1. No evidence of acute pulmonary embolism. 2. Bulky mediastinal and bilateral hilar adenopathy with nodular opacities throughout the chest and numerous areas of lytic change throughout the visualized skeleton. Findings concerning for primary pulmonary neoplasm or lymphoproliferative disorder with diffuse involvement/metastatic disease and with superimposed infection in the chest. 3. There was some mild nodal enlargement on previous exams. This is much worse and findings of diffuse bony lytic changes are also a new finding. 4. Some airway narrowing at the hila bilaterally worse on the right with some mild bronchial wall  thickening. Superimposed infection may be due to postobstructive changes. 5. Small right pleural effusion. 6. Small pericardial effusion. 7. Aortic atherosclerosis. Aortic Atherosclerosis (ICD10-I70.0). Electronically Signed   By: Zetta Bills M.D.   On: 03/05/2020 13:27   CT Abdomen Pelvis W Contrast  Result Date: 03/09/2020 CLINICAL DATA:  74 year old male with history of pneumonia. Mid right back pain. Swelling in the right flank. EXAM: CT CHEST, ABDOMEN, AND PELVIS WITH CONTRAST TECHNIQUE: Multidetector CT imaging of the chest, abdomen and pelvis was performed following the standard protocol during bolus administration of intravenous contrast. CONTRAST:  53mL OMNIPAQUE IOHEXOL 300 MG/ML  SOLN COMPARISON:  Chest CT 03/05/2020. CT the abdomen and pelvis 05/01/2004. FINDINGS: CT CHEST FINDINGS Cardiovascular: Heart size is normal. Small amount of pericardial fluid and/or thickening, slightly increased compared to the prior study, but unlikely to be of hemodynamic significance at this time. Atherosclerotic calcifications in the thoracic aorta as well as the left main, left anterior descending, left circumflex and right coronary arteries. Mediastinum/Nodes: Multiple enlarged mediastinal and hilar lymph nodes bilaterally, bulkiest of which is in the low right paratracheal nodal station measuring 2.3 cm in short axis, low left paratracheal nodal station measuring 2.8 cm in short axis, and AP window measuring 1.9 cm in short axis. Hilar lymph nodes measure up to 2 cm in short axis. Esophagus is unremarkable in appearance.  Esophagus is unremarkable in appearance. No axillary lymphadenopathy. Lungs/Pleura: Mass-like enlargement of the right infrahilar region best appreciated on axial image 40 of series 504 estimated to measure approximately 4.6 x 4.3 cm with postobstructive changes in the right middle lobe which appears to reflect a combination of consolidation and atelectasis. Multiple other pulmonary nodules are  again noted throughout the lungs bilaterally, largest of which is in the right lower lobe measuring 2.5 x 2.1 cm (axial image 126 of series 505). Musculoskeletal: Enumerable lytic lesions throughout the visualized axial and appendicular skeleton, compatible with widespread metastatic disease to the bones. CT ABDOMEN PELVIS FINDINGS Hepatobiliary: 1.5 x 1.3 cm centrally low-attenuation lesion with some peripheral nodular enhancement and progressive centripetal filling in segment 3 of the liver, compatible with a cavernous hemangioma. No other suspicious hepatic lesions. No intra or extrahepatic biliary ductal dilatation. Gallbladder is normal in appearance. Pancreas: No pancreatic mass. No pancreatic ductal dilatation. No pancreatic or peripancreatic fluid collections or inflammatory changes. Spleen: Unremarkable. Adrenals/Urinary Tract: Bilateral kidneys and adrenal glands are normal in appearance. No hydroureteronephrosis. Urinary bladder is normal in appearance. Stomach/Bowel: Normal appearance of the stomach. No pathologic dilatation of small bowel or colon. Several colonic diverticulae are noted, without surrounding inflammatory changes to suggest an acute diverticulitis at this time. The appendix is not confidently identified and may be surgically absent. Regardless, there are no inflammatory changes noted adjacent to the cecum to suggest the presence of an acute appendicitis at this time. Vascular/Lymphatic: Aortic atherosclerosis, without evidence of aneurysm or dissection noted in the abdominal or pelvic vasculature. No lymphadenopathy noted in the abdomen or pelvis. Reproductive: Prostate gland is enlarged and heterogeneous in appearance measuring 5.8 x 4.3 x 6.1 cm. Other: Small volume of ascites.  No pneumoperitoneum. Musculoskeletal: Numerous lytic osseous lesions are noted throughout the visualized axial and appendicular structures, highly concerning for widespread metastatic disease to the bones.  IMPRESSION: 1. 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 The primary malignancy is not confidently identified on today's examination, however, given the mass-like appearance of the right infrahilar region with the postobstructive changes in the right middle lobe, a primary bronchogenic carcinoma in the central right middle lobe is suspected. 2. Colonic diverticulosis without evidence of acute diverticulitis at this time. 3. 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 4. Severe prostatomegaly. 5. Aortic atherosclerosis, in addition to left main and 3 vessel coronary artery disease. Assessment for potential risk factor modification, dietary therapy or pharmacologic therapy may be warranted, if clinically indicated. 6. Additional incidental findings, as above. Electronically Signed   By: Vinnie Langton M.D.   On: 03/09/2020 14:45   DG Chest Port 1 View  Result Date: 03/11/2020 CLINICAL DATA:  Dyspnea, former smoker, history of pneumonia EXAM: PORTABLE CHEST 1 VIEW COMPARISON:  03/09/2020 chest radiograph. FINDINGS: Stable cardiomediastinal silhouette with normal heart size. No pneumothorax. Stable mild blunting of the right costophrenic angle suggesting small pleural effusion. No left pleural effusion. Worsening masslike consolidation in right infrahilar and right lower lung. Worsening hazy opacities in the left lung. Nodular opacity again noted in the peripheral right lower lobe. IMPRESSION: 1. Worsening masslike consolidation in the right infrahilar and right lower lung, likely a combination of pulmonary neoplasm and worsening postobstructive pneumonia as described on recent chest CT from 03/09/2020. 2. Nodular opacity in the peripheral right lower lung compatible with known  pulmonary nodule on recent chest CT. 3. Worsening hazy opacities in the left lung, also suspicious for pneumonia versus lymphangitic tumor. Electronically Signed   By: Ilona Sorrel M.D.   On: 03/11/2020 15:23   DG Chest Port 1 View  Result Date: 03/04/2020 CLINICAL DATA:  Pt presents to ED via POV with c/o cough, and SOB. Pt states when he coughs he has substernal CP. Upon arrival to ED pt's RA sats 85-87% on RA. Pt also c/o dyspnea on exertion. Pt placed on 2L via [place] at this time. EXAM: PORTABLE CHEST 1 VIEW COMPARISON:  Chest radiograph 03/28/2018 FINDINGS: Stable cardiomediastinal contours. The heart size. Lungs are hyperinflated. There are new infiltrates throughout the bilateral lower lungs concerning for infection. Background chronic bronchitic change. No pneumothorax or significant pleural effusion. No acute finding in the visualized skeleton. IMPRESSION: New infiltrates throughout the bilateral lower lungs concerning for infection, less likely edema. Recommend radiographic follow-up to resolution. Electronically Signed   By: Audie Pinto M.D.   On: 03/04/2020 14:12   ECHOCARDIOGRAM COMPLETE  Result Date: 03/12/2020    ECHOCARDIOGRAM REPORT   Patient Name:   Yaacov Singleterry Date of Exam: 03/12/2020 Medical Rec #:  [id number]   Height:       67.0 in Accession #:    [id number]  Weight:       145.0 lb Date of Birth:  [birth year]  BSA:          1.764 m Patient Age:    74 years    BP:           152/98 mmHg Patient Gender: M           HR:           72 bpm. Exam Location:  [facility] Procedure: 2D Echo, Color Doppler and Cardiac Doppler Indications:     I31.3 Pericardial effusion  History:         Patient has prior history of Echocardiogram examinations. Risk                  Factors:Hypertension.  Sonographer:     Charmayne Sheer RDCS (AE) Referring Phys:  [id number] Diagnosing Phys: Kathlyn Sacramento MD  Sonographer Comments: Suboptimal subcostal window. TDS due to inability of pt to stay in position on left  side. IMPRESSIONS  1. Left ventricular ejection fraction, by estimation, is 55 to 60%. The left ventricle has normal function. The left ventricle has no regional wall motion abnormalities. There is mild left ventricular hypertrophy. Left ventricular diastolic parameters are consistent with Grade I diastolic dysfunction (impaired relaxation).  2. Right ventricular systolic function is normal. The right ventricular size is normal. Tricuspid regurgitation signal is inadequate for assessing PA pressure.  3. The mitral valve is normal in structure. Mild mitral valve regurgitation. No evidence of mitral stenosis.  4. The aortic valve is normal in structure. Aortic valve regurgitation is not visualized. Mild aortic valve sclerosis is present, with no evidence of aortic valve stenosis.  5. The inferior vena cava is normal in size with <50% respiratory variability, suggesting right atrial pressure of 8 mmHg. FINDINGS  Left Ventricle: Left ventricular ejection fraction, by estimation, is 55 to 60%. The left ventricle has normal function. The left ventricle has no regional wall motion abnormalities. The left ventricular internal cavity size was normal in size. There is  mild left ventricular hypertrophy. Left ventricular diastolic parameters are consistent with Grade I diastolic dysfunction (impaired relaxation). Right Ventricle: The right ventricular size  is normal. No increase in right ventricular wall thickness. Right ventricular systolic function is normal. Tricuspid regurgitation signal is inadequate for assessing PA pressure. Left Atrium: Left atrial size was normal in size. Right Atrium: Right atrial size was normal in size. Pericardium: A small pericardial effusion is present. The pericardial effusion is circumferential. There is no evidence of cardiac tamponade. Mitral Valve: The mitral valve is normal in structure. Normal mobility of the mitral valve leaflets. Mild mitral valve regurgitation. No evidence of mitral  valve stenosis. MV peak gradient, 7.0 mmHg. The mean mitral valve gradient is 2.0 mmHg. Tricuspid Valve: The tricuspid valve is normal in structure. Tricuspid valve regurgitation is not demonstrated. No evidence of tricuspid stenosis. Aortic Valve: The aortic valve is normal in structure. Aortic valve regurgitation is not visualized. Mild aortic valve sclerosis is present, with no evidence of aortic valve stenosis. Aortic valve mean gradient measures 2.0 mmHg. Aortic valve peak gradient measures 3.5 mmHg. Aortic valve area, by VTI measures 2.04 cm. Pulmonic Valve: The pulmonic valve was normal in structure. Pulmonic valve regurgitation is trivial. No evidence of pulmonic stenosis. Aorta: The aortic root is normal in size and structure. Venous: The inferior vena cava is normal in size with less than 50% respiratory variability, suggesting right atrial pressure of 8 mmHg. IAS/Shunts: No atrial level shunt detected by color flow Doppler.  LEFT VENTRICLE PLAX 2D LVIDd:         4.34 cm  Diastology LVIDs:         3.04 cm  LV e' lateral:   4.46 cm/s LV PW:         1.10 cm  LV E/e' lateral: 16.6 LV IVS:        0.94 cm  LV e' medial:    3.59 cm/s LVOT diam:     2.00 cm  LV E/e' medial:  20.7 LV SV:         36 LV SV Index:   20 LVOT Area:     3.14 cm  LEFT ATRIUM         Index LA diam:    3.50 cm 1.98 cm/m  AORTIC VALVE                   PULMONIC VALVE AV Area (Vmax):    2.30 cm    PV Vmax:       0.85 m/s AV Area (Vmean):   2.22 cm    PV Vmean:      59.500 cm/s AV Area (VTI):     2.04 cm    PV VTI:        0.148 m AV Vmax:           93.90 cm/s  PV Peak grad:  2.9 mmHg AV Vmean:          58.700 cm/s PV Mean grad:  2.0 mmHg AV VTI:            0.177 m AV Peak Grad:      3.5 mmHg AV Mean Grad:      2.0 mmHg LVOT Vmax:         68.70 cm/s LVOT Vmean:        41.500 cm/s LVOT VTI:          0.115 m LVOT/AV VTI ratio: 0.65  AORTA Ao Root diam: 3.20 cm MITRAL VALVE MV Area (PHT): 2.99 cm    SHUNTS MV Peak grad:  7.0 mmHg     Systemic VTI:  0.12 m  MV Mean grad:  2.0 mmHg    Systemic Diam: 2.00 cm MV Vmax:       1.32 m/s MV Vmean:      56.1 cm/s MV Decel Time: 254 msec MV E velocity: 74.20 cm/s MV A velocity: 98.50 cm/s MV E/A ratio:  0.75 Kathlyn Sacramento MD Electronically signed by Kathlyn Sacramento MD Signature Date/Time: 03/12/2020/3:00:50 PM    Final    [place] CORE BIOPSY (SOFT TISSUE)  Result Date: 03/07/2020 INDICATION: 74 year old male with a history of pathologic supraclavicular adenopathy, likely metastatic lung cancer EXAM: ULTRASOUND-GUIDED BIOPSY RIGHT SUPRACLAVICULAR LYMPH NODE MEDICATIONS: None. ANESTHESIA/SEDATION: None. FLUOROSCOPY TIME:  None COMPLICATIONS: None PROCEDURE: Informed written consent was obtained from the patient after a thorough discussion of the procedural risks, benefits and alternatives. All questions were addressed. Maximal Sterile Barrier Technique was utilized including caps, mask, sterile gowns, sterile gloves, sterile drape, hand hygiene and skin antiseptic. A timeout was performed prior to the initiation of the procedure. Ultrasound survey was performed with images stored and sent to PACs. The neck was prepped with chlorhexidine in a sterile fashion, and a sterile drape was applied covering the operative field. A sterile gown and sterile gloves were used for the procedure. Local anesthesia was provided with 1% Lidocaine. Ultrasound guidance was used to infiltrate the region with 1% lidocaine for local anesthesia. Multiple separate 18 gauge core needle biopsy were then acquired of the right supraclavicular lymph node using ultrasound guidance. Images were stored. Final image was stored after biopsy. Patient tolerated the procedure well and remained hemodynamically stable throughout. No complications were encountered and no significant blood loss was encounter IMPRESSION: Status post ultrasound-guided right supraclavicular lymph node biopsy. Signed, Dulcy Fanny. Dellia Nims, RPVI [facility] Radiology Electronically Signed   By: Corrie Mckusick D.O.   On: 03/07/2020 08:42    Microbiology: Recent Results (from the past 240 hour(s))  Blood Culture (routine x 2)     Status: None   Collection Time: 03/04/20 12:18 PM   Specimen: BLOOD  Result Value Ref Range Status   Specimen Description BLOOD RARM [facility]  Final   Special Requests   Final    BOTTLES DRAWN AEROBIC AND ANAEROBIC Blood Culture adequate volume   Culture   Final    NO GROWTH 5 DAYS Performed at [facility], [address] [place], [place] [address]    Report Status 03/09/2020 FINAL  Final  Blood Culture (routine x 2)     Status: None   Collection Time: 03/04/20  3:16 PM   Specimen: BLOOD  Result Value Ref Range Status   Specimen Description BLOOD L HAND  Final   Special Requests   Final    BOTTLES DRAWN AEROBIC AND ANAEROBIC Blood Culture adequate volume   Culture   Final    NO GROWTH 5 DAYS Performed at [facility], [address], [place], [place] [address]    Report Status 03/09/2020 FINAL  Final  Respiratory Panel by RT PCR (Flu A&B, Covid) - Nasopharyngeal Swab     Status: None   Collection Time: 03/04/20  3:17 PM   Specimen: Nasopharyngeal Swab  Result Value Ref Range Status   SARS Coronavirus 2 by RT PCR NEGATIVE NEGATIVE Final    Comment: (NOTE) SARS-CoV-2 target nucleic acids are NOT DETECTED. The SARS-CoV-2 RNA is generally detectable in upper respiratoy specimens during the acute phase of infection. The lowest concentration of SARS-CoV-2 viral copies this assay can detect is 131 copies/mL.  A negative result does not preclude SARS-Cov-2 infection and should not be used as the sole basis for treatment or other patient management decisions. A negative result may occur with  improper specimen collection/handling, submission of specimen other than nasopharyngeal swab, presence of viral mutation(s) within the areas targeted by this assay, and  inadequate number of viral copies (<131 copies/mL). A negative result must be combined with clinical observations, patient history, and epidemiological information. The expected result is Negative. Fact Sheet for Patients:  PinkCheek.be Fact Sheet for Healthcare Providers:  GravelBags.it This test is not yet ap proved or cleared by the [place] FDA and  has been authorized for detection and/or diagnosis of SARS-CoV-2 by FDA under an Emergency Use Authorization (EUA). This EUA will remain  in effect (meaning this test can be used) for the duration of the COVID-19 declaration under Section 564(b)(1) of the Act, 21 U.S.C. section 360bbb-3(b)(1), unless the authorization is terminated or revoked sooner.    Influenza A by PCR NEGATIVE NEGATIVE Final   Influenza B by PCR NEGATIVE NEGATIVE Final    Comment: (NOTE) The Xpert Xpress SARS-CoV-2/FLU/RSV assay is intended as an aid in  the diagnosis of influenza from Nasopharyngeal swab specimens and  should not be used as a sole basis for treatment. Nasal washings and  aspirates are unacceptable for Xpert Xpress SARS-CoV-2/FLU/RSV  testing. Fact Sheet for Patients: PinkCheek.be Fact Sheet for Healthcare Providers: GravelBags.it This test is not yet approved or cleared by the [place] FDA and  has been authorized for detection and/or diagnosis of SARS-CoV-2 by  FDA under an Emergency Use Authorization (EUA). This EUA will remain  in effect (meaning this test can be used) for the duration of the  Covid-19 declaration under Section 564(b)(1) of the Act, 21  U.S.C. section 360bbb-3(b)(1), unless the authorization is  terminated or revoked. Performed at [facility], [place], [place] [address]   SARS CORONAVIRUS 2 (TAT 6-24 HRS) Nasopharyngeal Nasopharyngeal Swab     Status: None   Collection  Time: 03/05/20  2:35 PM   Specimen: Nasopharyngeal Swab  Result Value Ref Range Status   SARS Coronavirus 2 NEGATIVE NEGATIVE Final    Comment: (NOTE) SARS-CoV-2 target nucleic acids are NOT DETECTED. The SARS-CoV-2 RNA is generally detectable in upper and lower respiratory specimens during the acute phase of infection. Negative results do not preclude SARS-CoV-2 infection, do not rule out co-infections with other pathogens, and should not be used as the sole basis for treatment or other patient management decisions. Negative results must be combined with clinical observations, patient history, and epidemiological information. The expected result is Negative. Fact Sheet for Patients: SugarRoll.be Fact Sheet for Healthcare Providers: https://www.woods-mathews.com/ This test is not yet approved or cleared by the [place] FDA and  has been authorized for detection and/or diagnosis of SARS-CoV-2 by FDA under an Emergency Use Authorization (EUA). This EUA will remain  in effect (meaning this test can be used) for the duration of the COVID-19 declaration under Section 56 4(b)(1) of the Act, 21 U.S.C. section 360bbb-3(b)(1), unless the authorization is terminated or revoked sooner. Performed at [facility], [place] [address] [place], [place] [address]   Culture, blood (Routine x 2)     Status: None   Collection Time: 03/09/20 12:31 PM   Specimen: BLOOD  Result Value Ref Range Status   Specimen Description BLOOD BLOOD RIGHT FOREARM  Final   Special Requests   Final    BOTTLES DRAWN  AEROBIC AND ANAEROBIC Blood Culture results may not be optimal due to an inadequate volume of blood received in culture bottles   Culture   Final    NO GROWTH 5 DAYS Performed at [facility], [address] [place], [place] [address]    Report Status 03/14/2020 FINAL  Final  Culture, blood (Routine x 2)     Status: None   Collection Time:  03/09/20 12:40 PM   Specimen: BLOOD  Result Value Ref Range Status   Specimen Description BLOOD LEFT ANTECUBITAL  Final   Special Requests   Final    BOTTLES DRAWN AEROBIC AND ANAEROBIC Blood Culture results may not be optimal due to an excessive volume of blood received in culture bottles   Culture   Final    NO GROWTH 5 DAYS Performed at [facility], [address], [place], [place] [address]    Report Status 03/14/2020 FINAL  Final  Respiratory Panel by RT PCR (Flu A&B, Covid) - Nasopharyngeal Swab     Status: None   Collection Time: 03/09/20  2:32 PM   Specimen: Nasopharyngeal Swab  Result Value Ref Range Status   SARS Coronavirus 2 by RT PCR NEGATIVE NEGATIVE Final    Comment: (NOTE) SARS-CoV-2 target nucleic acids are NOT DETECTED. The SARS-CoV-2 RNA is generally detectable in upper respiratoy specimens during the acute phase of infection. The lowest concentration of SARS-CoV-2 viral copies this assay can detect is 131 copies/mL. A negative result does not preclude SARS-Cov-2 infection and should not be used as the sole basis for treatment or other patient management decisions. A negative result may occur with  improper specimen collection/handling, submission of specimen other than nasopharyngeal swab, presence of viral mutation(s) within the areas targeted by this assay, and inadequate number of viral copies (<131 copies/mL). A negative result must be combined with clinical observations, patient history, and epidemiological information. The expected result is Negative. Fact Sheet for Patients:  PinkCheek.be Fact Sheet for Healthcare Providers:  GravelBags.it This test is not yet ap proved or cleared by the [place] FDA and  has been authorized for detection and/or diagnosis of SARS-CoV-2 by FDA under an Emergency Use Authorization (EUA). This EUA will remain  in effect (meaning this test can be used)  for the duration of the COVID-19 declaration under Section 564(b)(1) of the Act, 21 U.S.C. section 360bbb-3(b)(1), unless the authorization is terminated or revoked sooner.    Influenza A by PCR NEGATIVE NEGATIVE Final   Influenza B by PCR NEGATIVE NEGATIVE Final    Comment: (NOTE) The Xpert Xpress SARS-CoV-2/FLU/RSV assay is intended as an aid in  the diagnosis of influenza from Nasopharyngeal swab specimens and  should not be used as a sole basis for treatment. Nasal washings and  aspirates are unacceptable for Xpert Xpress SARS-CoV-2/FLU/RSV  testing. Fact Sheet for Patients: PinkCheek.be Fact Sheet for Healthcare Providers: GravelBags.it This test is not yet approved or cleared by the [place] FDA and  has been authorized for detection and/or diagnosis of SARS-CoV-2 by  FDA under an Emergency Use Authorization (EUA). This EUA will remain  in effect (meaning this test can be used) for the duration of the  Covid-19 declaration under Section 564(b)(1) of the Act, 21  U.S.C. section 360bbb-3(b)(1), unless the authorization is  terminated or revoked. Performed at [facility], [address] [place], [place] [address]      Labs: Basic Metabolic Panel: Recent Labs  Lab 03/09/20 1230 03/10/20 0416 03/11/20 0747 03/12/20  0434 03/13/20 0458  NA 138 137 136 135 139  K 3.2* 3.9 4.4 4.2 4.0  CL 101 104 104 96* 94*  CO2 25 22 22 23 31   GLUCOSE 88 86 70 256* 155*  BUN 32* 27* 31* 41* 46*  CREATININE 1.19 1.11 1.25* 1.53* 1.81*  CALCIUM 11.0* 10.5* 11.8* 10.8* 10.3  MG  --   --  2.0 2.0  --   PHOS  --   --   --  4.4  --    Liver Function Tests: Recent Labs  Lab 03/09/20 1230 03/10/20 0416 03/11/20 0747  AST 75* 38 31  ALT 73* 47* 33  ALKPHOS 181* 132* 126  BILITOT 0.9 0.8 1.3*  PROT 7.2 6.3* 6.3*  ALBUMIN 2.4* 2.1* 2.2*   No results for input(s): LIPASE, AMYLASE in the last 168 hours. No  results for input(s): AMMONIA in the last 168 hours. CBC: Recent Labs  Lab 03/09/20 1230 03/10/20 0416 03/11/20 0747 03/12/20 0434 03/13/20 0458  WBC 17.0* 16.5* 14.8* 11.8* 24.8*  NEUTROABS 13.7*  --   --   --   --   HGB 13.7 12.1* 11.7* 11.2* 10.5*  HCT 39.9 36.3* 35.6* 33.6* 29.7*  MCV 94.8 96.8 98.1 96.8 91.4  PLT 323 295 300 301 229   Cardiac Enzymes: No results for input(s): CKTOTAL, CKMB, CKMBINDEX, TROPONINI in the last 168 hours. BNP: BNP (last 3 results) No results for input(s): BNP in the last 8760 hours.  ProBNP (last 3 results) No results for input(s): PROBNP in the last 8760 hours.  CBG: Recent Labs  Lab 03/11/20 1316 03/11/20 1520  GLUCAP 73 74   Assessment/Plan: A/c On C/h respiratory failure with hypoxia- -attributed to his Postobstructive pneumonia and Lung ca.  -family has decided for comfort care and wish to be d/c with home hospice. -pt has completed iv abx therapy and will d/c with medrol dose pack.  -pt has been on iv abx since admission and pt has completed 5 days of total iv abx therapy.  Leucocytosis is attributed to steroid therapy .  Lung Mass: Attribute to his Lung Ca.  Prn morphine for pain control.  HTN: cont amlodipine and metoprolol.   Anemia: Suspect dilutional and phlebotomy related.  Prostatic enlargement on CT: Pt is asymptomatic.   Signed:  Para Skeans MD.  [facility] 03/14/2020, 12:22 PM

## 2020-03-14 NOTE — Progress Notes (Addendum)
CSW was informed that palliative recommended patient discharge to [place] today. CSW called and spoke with patient's wife, Lorenso Courier. Antoinette confirmed that her preference is for patient to go to [facility] in [place]. She denied additional needs at this time and was encouraged to reach out if needs/questions arise. CSW will continue to follow.  11:15- EMS paperwork completed and placed with DNR by patient's chart. RN Lovena Le and [facility] Representative Santiago Glad updated. Per Erenest Rasher will call EMS when patient is ready for transport. CSW will continue to follow for any needs before discharge.  Oleh Genin, [place]

## 2020-03-14 NOTE — TOC Transition Note (Signed)
Transition of Care [facility]) - CM/SW Discharge Note   Patient Details  Name: Khamari Sheehan MRN: [id number] Date of Birth: [birth year]  Transition of Care [facility]) CM/SW Contact:  Magnus Ivan, LCSW Phone Number: 03/14/2020, 1:08 PM   Clinical Narrative:   Patient has orders to discharge to [facility] today. CSW completed EMS paperwork. RN and [facility] Representative Santiago Glad aware. Santiago Glad reported she will call for EMS transport when patient is ready to discharge from hospital. CSW informed [facility] Representative Janett Billow who reported they will close out referral they had on patient. Spoke with wife who is aware of transfer planned for today and chose [facility] for patient's care. No other needs identified at this time. CSW signing off.    Final next level of care: [place] Barriers to Discharge: Barriers Resolved   Patient Goals and CMS Choice     Choice offered to / list presented to : Spouse  Discharge Placement              Patient chooses bed at: [facility]) Patient to be transferred to facility by: Non-emergent EMS Name of family member notified: Lorenso Courier - wife Patient and family notified of of transfer: 03/14/20  Discharge Plan and Services                                     Social Determinants of Health (SDOH) Interventions     Readmission Risk Interventions No flowsheet data found.

## 2020-03-15 ENCOUNTER — Other Ambulatory Visit: Payer: Medicare HMO

## 2020-03-16 NOTE — Progress Notes (Signed)
03/16/20  1400: Received a phone call from patient's spouse. She stated he died here and was asking what funeral home he was released to and questions about the death certificate. Search through our paperwork and did not find information on him. Told her I would need to call her back. Reviewed chart, documented he had been transferred to [facility]. Called Hospice. They stated he died yesterday and his remains were released to [facility]. Confirmed with [facility] that his remains were in their custody. I called her back. Explained he had been at hospice when he died and they had released him to [facility] in [place]. She asked if she should go there. I told her she needed to call before she went to funeral home. Also explained funeral home would get her a certified copy of the death certificate. She said she would call funeral home, I gave her their phone number. When I finished speaking with her, I contacted [facility], explained I had been contacted by his wife because she stated she did not know where his remains were released to. They stated the deceased's grandson had made arrangements this morning. They then said the wife had called them.

## 2020-03-29 NOTE — Progress Notes (Signed)
Tumor Board Documentation  Troy Shepherd was presented by Dr Rogue Bussing at our Tumor Board on March 27, 2020, which included representatives from medical oncology, neuro oncology, radiation oncology, navigation, pathology, radiology, surgical, surgical oncology, internal medicine, pharmacy, genetics, pulmonology, palliative care, research.  Troy Shepherd currently presents as a new patient, for [place], for new positive pathology with history of the following treatments: active survellience, surgical intervention(s).  Additionally, we reviewed previous medical and familial history, history of present illness, and recent lab results along with all available histopathologic and imaging studies. The tumor board considered available treatment options and made the following recommendations:   Hospice care vs Treatment if pt condition improves  The following procedures/referrals were also placed: No orders of the defined types were placed in this encounter.   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  Tumor board is a meeting of clinicians from various specialty areas who evaluate and discuss patients for whom a multidisciplinary approach is being considered. Final determinations in the plan of care are those of the provider(s). The responsibility for follow up of recommendations given during tumor board is that of the provider.   Today's extended care, comprehensive team conference, Troy Shepherd was not present for the discussion and was not examined.   Multidisciplinary Tumor Board is a multidisciplinary case peer review process.  Decisions discussed in the Multidisciplinary Tumor Board reflect the opinions of the specialists present at the conference without having examined the patient.  Ultimately, treatment and  diagnostic decisions rest with the primary provider(s) and the patient.

## 2020-03-29 DEATH — deceased
